# Patient Record
Sex: Male | Born: 1990 | Race: White | Hispanic: No | Marital: Single | Smoking: Current every day smoker
Health system: Southern US, Community
[De-identification: ages and names within clinical notes are randomized; demographics above are authoritative.]

## PROBLEM LIST (undated history)

## (undated) DIAGNOSIS — Z789 Other specified health status: Secondary | ICD-10-CM

## (undated) DIAGNOSIS — F319 Bipolar disorder, unspecified: Secondary | ICD-10-CM

## (undated) DIAGNOSIS — I10 Essential (primary) hypertension: Secondary | ICD-10-CM

## (undated) DIAGNOSIS — R4586 Emotional lability: Secondary | ICD-10-CM

## (undated) HISTORY — PX: BRAIN SURGERY: SHX531

---
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
## 2008-12-30 ENCOUNTER — Emergency Department: Payer: Self-pay | Admitting: Emergency Medicine

## 2010-08-26 ENCOUNTER — Emergency Department: Payer: Self-pay | Admitting: Emergency Medicine

## 2010-08-26 IMAGING — CT CT HEAD WITHOUT CONTRAST
2 series · 16 of 30 positions shown, 20 images · non-contrast
Comparison: none

REASON FOR EXAM: one year of headaches
COMMENTS:

PROCEDURE:     CT  - CT HEAD WITHOUT CONTRAST  - [DATE] [DATE]
RESULT:     History: Headaches.

[Series 2: without · axial · non-contrast · 0.44mm/px · z∈[+932,+1056]mm · 13 of 31 slices shown, 17 images]
[im 3/31  brain]
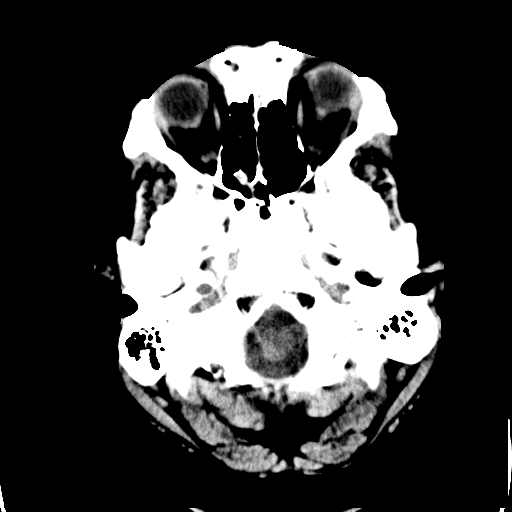
[im 3/31  bone]
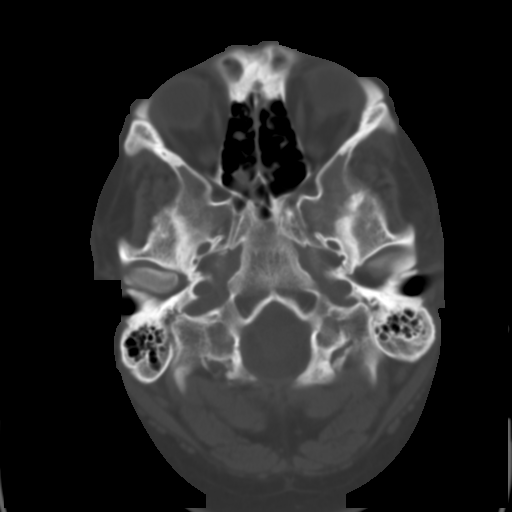
[im 5/31  brain]
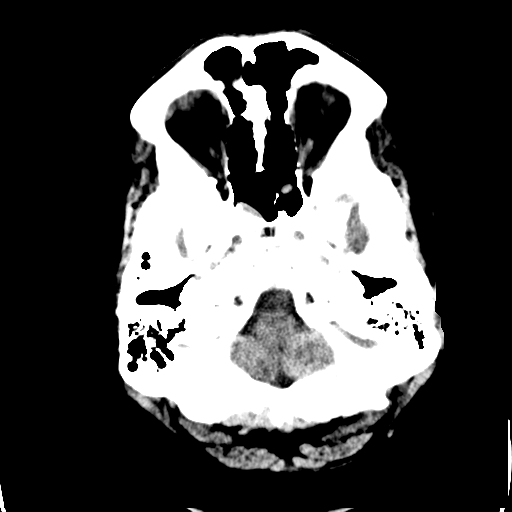
[im 7/31  brain]
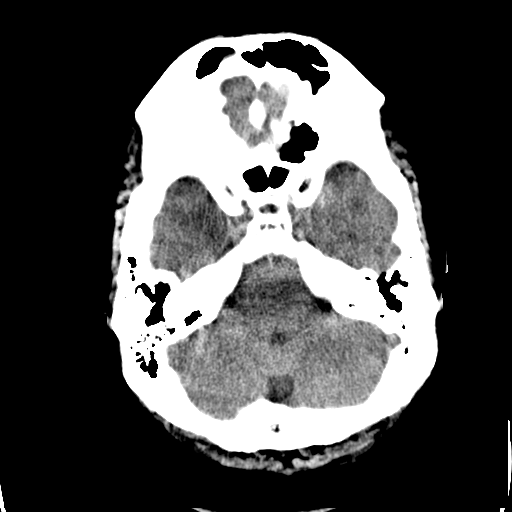
[im 9/31  brain]
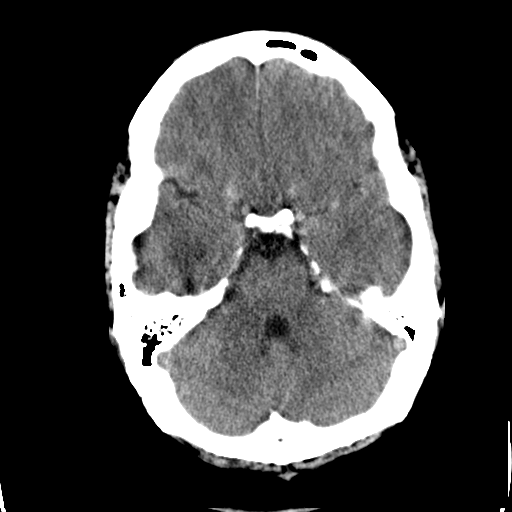
[im 11/31  brain]
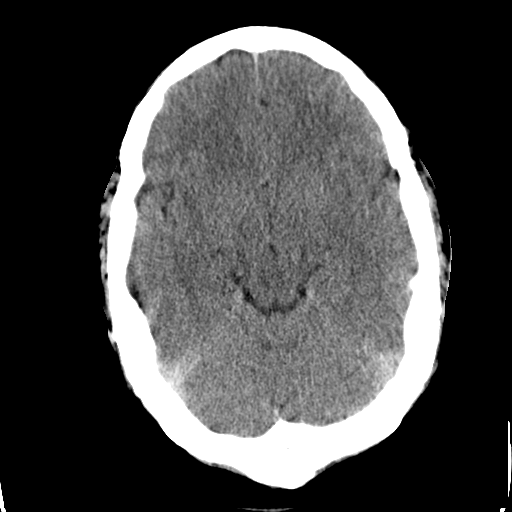
[im 11/31  bone]
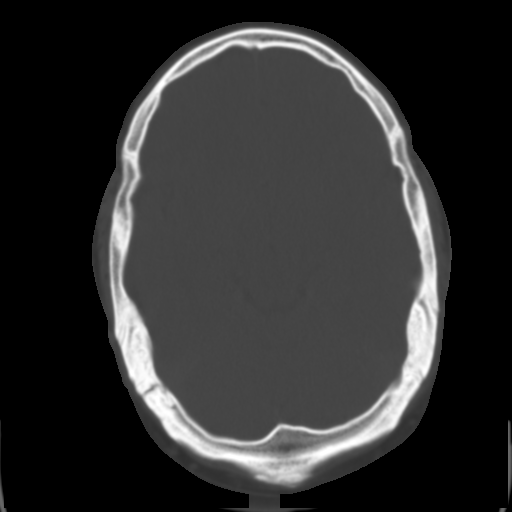
[im 13/31  brain]
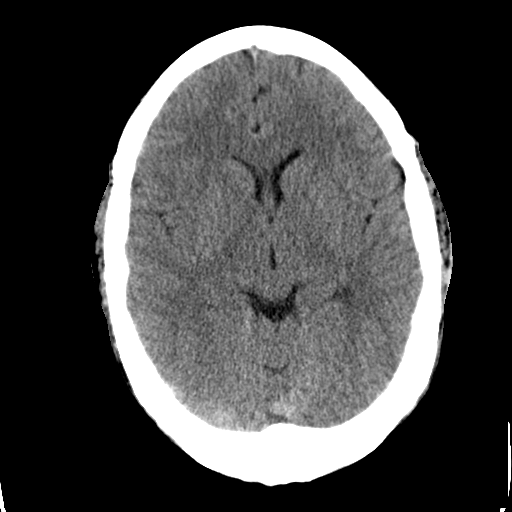
[im 16/31  brain]
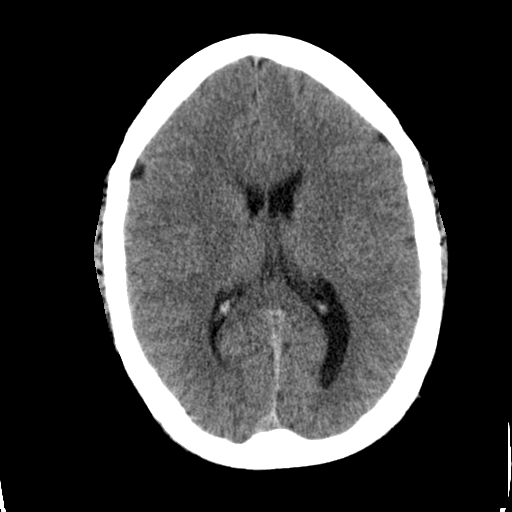
[im 18/31  brain]
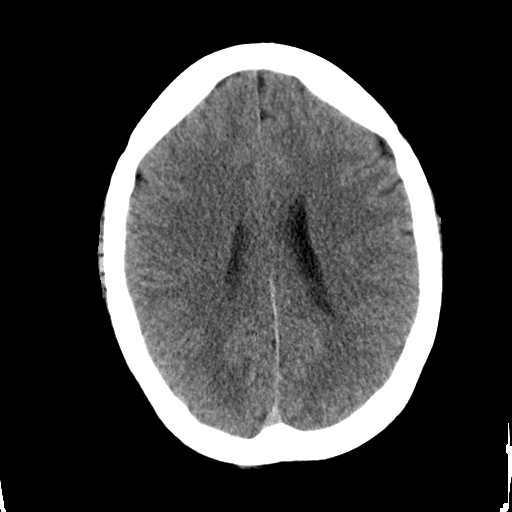
[im 20/31  brain]
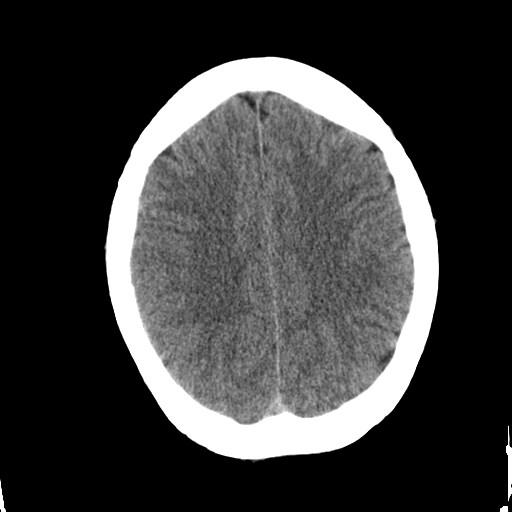
[im 20/31  bone]
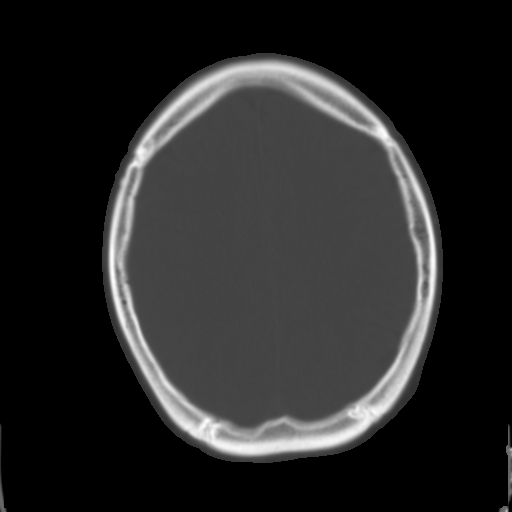
[im 22/31  brain]
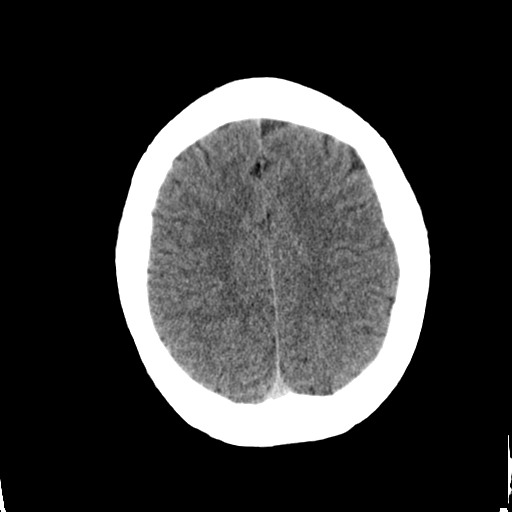
[im 24/31  brain]
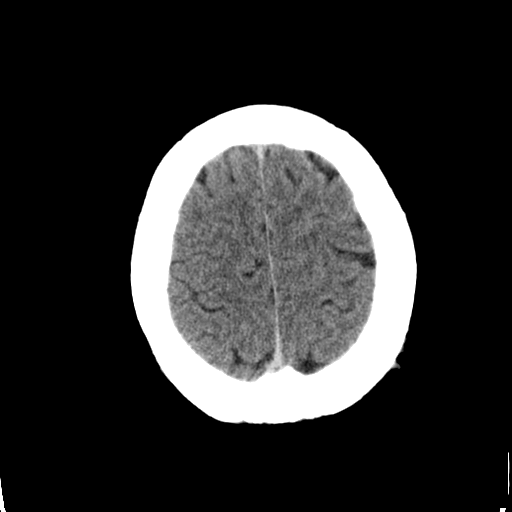
[im 26/31  brain]
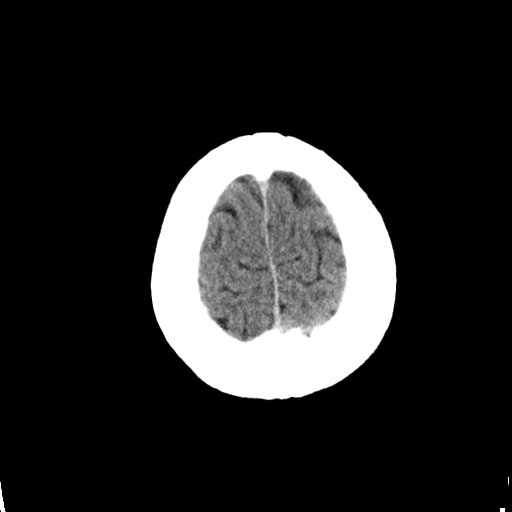
[im 28/31  brain]
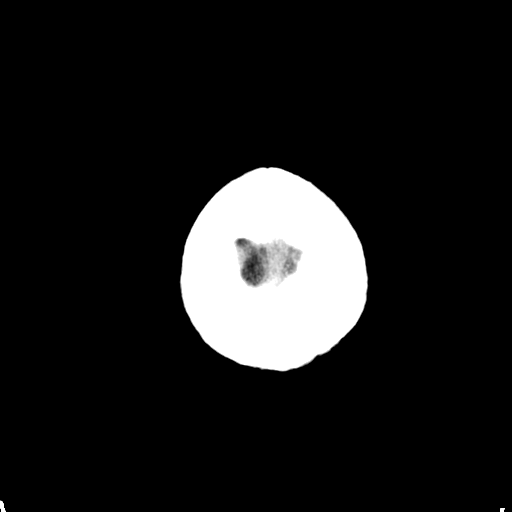
[im 28/31  bone]
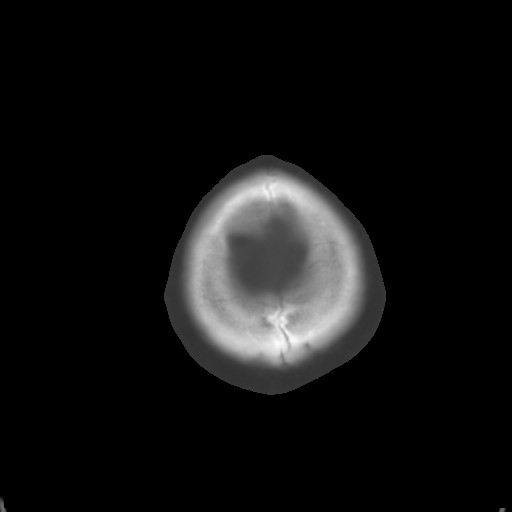

[Series 3: bone · axial · 0.44mm/px · z∈[+932,+972]mm · 3 of 31 slices shown]
[im 3/31  bone]
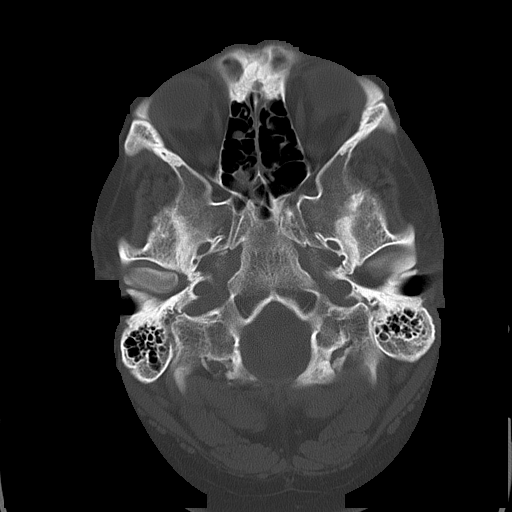
[im 7/31  bone]
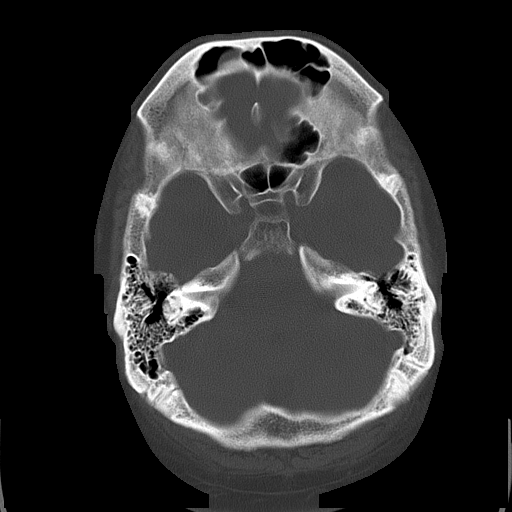
[im 11/31  bone]
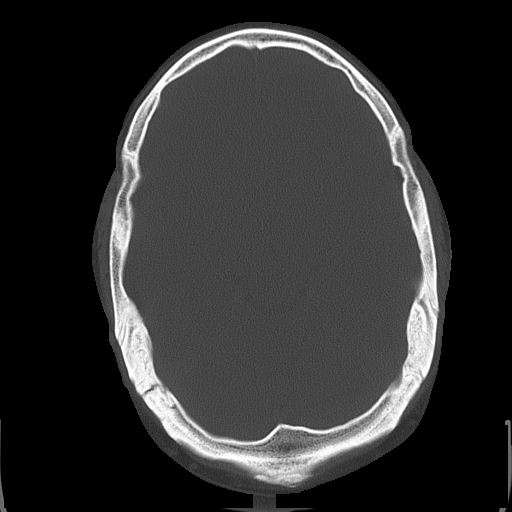

[16 of 30 positions shown; findings below may reference images not displayed]

FINDINGS: No mass lesion. No hydrocephalus. No bleed. No acute bony
abnormality.
IMPRESSION: No acute abnormality.

## 2011-05-03 ENCOUNTER — Inpatient Hospital Stay: Payer: Self-pay | Admitting: Psychiatry

## 2012-04-03 ENCOUNTER — Emergency Department: Payer: Self-pay | Admitting: Emergency Medicine

## 2012-04-03 LAB — CBC WITH DIFFERENTIAL/PLATELET
Basophil #: 0.1 10*3/uL (ref 0.0–0.1)
Eosinophil #: 0.5 10*3/uL (ref 0.0–0.7)
HCT: 54 % — ABNORMAL HIGH (ref 40.0–52.0)
Lymphocyte %: 31.3 %
MCH: 31.4 pg (ref 26.0–34.0)
MCHC: 34.3 g/dL (ref 32.0–36.0)
Monocyte #: 0.7 x10 3/mm (ref 0.2–1.0)
Monocyte %: 6.3 %
Neutrophil #: 6 10*3/uL (ref 1.4–6.5)
Platelet: 222 10*3/uL (ref 150–440)
RDW: 13.3 % (ref 11.5–14.5)
WBC: 10.5 10*3/uL (ref 3.8–10.6)

## 2012-04-03 LAB — PROTIME-INR
INR: 0.9
Prothrombin Time: 12.8 secs (ref 11.5–14.7)

## 2012-04-03 LAB — COMPREHENSIVE METABOLIC PANEL
Albumin: 4.7 g/dL (ref 3.4–5.0)
Alkaline Phosphatase: 75 U/L (ref 50–136)
Anion Gap: 10 (ref 7–16)
Calcium, Total: 9.8 mg/dL (ref 8.5–10.1)
Chloride: 106 mmol/L (ref 98–107)
Co2: 26 mmol/L (ref 21–32)
EGFR (Non-African Amer.): >60
Glucose: 92 mg/dL (ref 65–99)
Potassium: 3.7 mmol/L (ref 3.5–5.1)
SGOT(AST): 13 U/L — ABNORMAL LOW (ref 15–37)
SGPT (ALT): 31 U/L (ref 12–78)
Total Protein: 8 g/dL (ref 6.4–8.2)

## 2013-04-07 ENCOUNTER — Emergency Department: Payer: Self-pay | Admitting: Emergency Medicine

## 2020-07-27 ENCOUNTER — Encounter: Payer: Self-pay | Admitting: Emergency Medicine

## 2020-07-27 ENCOUNTER — Emergency Department
Admission: EM | Admit: 2020-07-27 | Discharge: 2020-07-27 | Disposition: A | Discharge status: Home / Self Care | Payer: Self-pay | Attending: Emergency Medicine | Admitting: Emergency Medicine

## 2020-07-27 ENCOUNTER — Emergency Department: Payer: Self-pay

## 2020-07-27 ENCOUNTER — Other Ambulatory Visit: Payer: Self-pay

## 2020-07-27 DIAGNOSIS — F22 Delusional disorders: Secondary | ICD-10-CM | POA: Insufficient documentation

## 2020-07-27 DIAGNOSIS — R109 Unspecified abdominal pain: Secondary | ICD-10-CM | POA: Insufficient documentation

## 2020-07-27 DIAGNOSIS — F17200 Nicotine dependence, unspecified, uncomplicated: Secondary | ICD-10-CM | POA: Insufficient documentation

## 2020-07-27 LAB — CBC WITH DIFFERENTIAL/PLATELET
Abs Immature Granulocytes: 0.03 10*3/uL (ref 0.00–0.07)
Basophils Absolute: 0 10*3/uL (ref 0.0–0.1)
Basophils Relative: 0 %
Eosinophils Absolute: 0.1 10*3/uL (ref 0.0–0.5)
Eosinophils Relative: 1 %
HCT: 49 % (ref 39.0–52.0)
Hemoglobin: 16.8 g/dL (ref 13.0–17.0)
Immature Granulocytes: 0 %
Lymphocytes Relative: 18 %
Lymphs Abs: 1.6 10*3/uL (ref 0.7–4.0)
MCH: 31.8 pg (ref 26.0–34.0)
MCHC: 34.3 g/dL (ref 30.0–36.0)
MCV: 92.8 fL (ref 80.0–100.0)
Monocytes Absolute: 0.6 10*3/uL (ref 0.1–1.0)
Monocytes Relative: 6 %
Neutro Abs: 6.4 10*3/uL (ref 1.7–7.7)
Neutrophils Relative %: 75 %
Platelets: 202 10*3/uL (ref 150–400)
RBC: 5.28 MIL/uL (ref 4.22–5.81)
RDW: 12 % (ref 11.5–15.5)
WBC: 8.7 10*3/uL (ref 4.0–10.5)
nRBC: 0 % (ref 0.0–0.2)

## 2020-07-27 LAB — COMPREHENSIVE METABOLIC PANEL
ALT: 16 U/L (ref 0–44)
AST: 25 U/L (ref 15–41)
Albumin: 4.4 g/dL (ref 3.5–5.0)
Alkaline Phosphatase: 46 U/L (ref 38–126)
Anion gap: 10 (ref 5–15)
BUN: 12 mg/dL (ref 6–20)
CO2: 27 mmol/L (ref 22–32)
Calcium: 9.3 mg/dL (ref 8.9–10.3)
Chloride: 101 mmol/L (ref 98–111)
Creatinine, Ser: 0.86 mg/dL (ref 0.61–1.24)
GFR, Estimated: >60 mL/min (ref >60)
Glucose, Bld: 109 mg/dL — ABNORMAL HIGH (ref 70–99)
Potassium: 4.7 mmol/L (ref 3.5–5.1)
Sodium: 138 mmol/L (ref 135–145)
Total Bilirubin: 1.3 mg/dL — ABNORMAL HIGH (ref 0.3–1.2)
Total Protein: 7 g/dL (ref 6.5–8.1)

## 2020-07-27 LAB — LIPASE, BLOOD: Lipase: 24 U/L (ref 11–51)

## 2020-07-27 LAB — TROPONIN I (HIGH SENSITIVITY): Troponin I (High Sensitivity): 3 ng/L (ref <18)

## 2020-07-27 MED ORDER — IOHEXOL 300 MG/ML  SOLN
100.0000 mL | Freq: Once | INTRAMUSCULAR | Status: AC | PRN
  Administered 2020-07-27: 100 mL via INTRAVENOUS

## 2020-07-27 MED ORDER — IOHEXOL 9 MG/ML PO SOLN: 500.0000 mL | Freq: Two times a day (BID) | ORAL | Status: DC | PRN

## 2020-07-27 MED ORDER — SODIUM CHLORIDE 0.9 % IV BOLUS
1000.0000 mL | Freq: Once | INTRAVENOUS | Status: AC
  Administered 2020-07-27: 1000 mL via INTRAVENOUS

## 2020-07-27 NOTE — ED Provider Notes (Signed)
[facility] Emergency Department Provider Note   ____________________________________________   Event Date/Time   First MD Initiated Contact with Patient 07/27/20 [phone]     (approximate)  I have reviewed the triage vital signs and the nursing notes.   HISTORY  Chief Complaint Abdominal Pain    HPI Cody Young. is a 30 y.o. male with stated past medical history of alcohol abuse who presents for the sensation of "spiders in my stomach".  Patient states that he feels like there is small amounts of movement all over his abdomen that "feels like crawling spiders".  Patient states that this has been present over the last 2-3 weeks and varying degrees of intensity.  Patient also states his last bowel movement was yesterday and was well formed and normal.  Patient denies any history of psychosis.  Patient currently denies any vision changes, tinnitus, difficulty speaking, facial droop, sore throat, chest pain, shortness of breath, nausea/vomiting/diarrhea, dysuria, or weakness/numbness/paresthesias in any extremity         History reviewed. No pertinent past medical history.  There are no problems to display for this patient.   History reviewed. No pertinent surgical history.  Prior to Admission medications   Not on File    Allergies Patient has no known allergies.  No family history on file.  Social History Social History   Tobacco Use  . Smoking status: Current Every Day Smoker    Packs/day: 1.00  . Smokeless tobacco: Never Used  Substance Use Topics  . Alcohol use: Yes  . Drug use: Yes    Types: Marijuana    Review of Systems Constitutional: No fever/chills Eyes: No visual changes. ENT: No sore throat. Cardiovascular: Denies chest pain. Respiratory: Denies shortness of breath. Gastrointestinal: Endorses abdominal pain.  No nausea, no vomiting.  No diarrhea. Genitourinary: Negative for dysuria. Musculoskeletal: Negative for  acute arthralgias Skin: Negative for rash. Neurological: Negative for headaches, weakness/numbness/paresthesias in any extremity Psychiatric: Negative for suicidal ideation/homicidal ideation   ____________________________________________   PHYSICAL EXAM:  VITAL SIGNS: ED Triage Vitals  Enc Vitals Group     BP 07/27/20 0915 (!) 158/116     Pulse Rate 07/27/20 0915 79     Resp 07/27/20 0915 16     Temp 07/27/20 0915 97.9 F (36.6 C)     Temp Source 07/27/20 0915 Oral     SpO2 07/27/20 0915 100 %     Weight 07/27/20 0912 240 lb (108.9 kg)     Height 07/27/20 0912 6\' 2"  (1.88 m)     Head Circumference --      Peak Flow --      Pain Score 07/27/20 0911 8     Pain Loc --      Pain Edu? --      Excl. in [place]? --    Constitutional: Alert and oriented. Well appearing and in no acute distress. Eyes: Conjunctivae are normal. PERRL. Head: Atraumatic. Nose: No congestion/rhinnorhea. Mouth/Throat: Mucous membranes are moist. Neck: No stridor Cardiovascular: Grossly normal heart sounds.  Good peripheral circulation. Respiratory: Normal respiratory effort.  No retractions. Gastrointestinal: Soft and nontender. No distention. Musculoskeletal: No obvious deformities Neurologic:  Normal speech and language. No gross focal neurologic deficits are appreciated. Skin:  Skin is warm and dry. No rash noted. Psychiatric: Mood and affect are normal. Speech and behavior are normal.  ____________________________________________   LABS (all labs ordered are listed, but only abnormal results are displayed)  Labs Reviewed  COMPREHENSIVE METABOLIC PANEL -  Abnormal; Notable for the following components:      Result Value   Glucose, Bld 109 (*)    Total Bilirubin 1.3 (*)    All other components within normal limits  LIPASE, BLOOD  CBC WITH DIFFERENTIAL/PLATELET  URINALYSIS, COMPLETE (UACMP) WITH MICROSCOPIC  URINE DRUG SCREEN, QUALITATIVE ([facility] ONLY)  TROPONIN I (HIGH SENSITIVITY)  TROPONIN I  (HIGH SENSITIVITY)   ____________________________________________  EKG  ED ECG REPORT I, Merwyn Katos, the attending physician, personally viewed and interpreted this ECG.  Date: 07/27/2020 EKG Time: 0916 Rate: 77 Rhythm: normal sinus rhythm QRS Axis: normal Intervals: normal ST/T Wave abnormalities: normal Narrative Interpretation: no evidence of acute ischemia  ____________________________________________  RADIOLOGY  ED MD interpretation: CT of the abdomen and pelvis with IV contrast shows no evidence of acute abnormalities including no acute obstruction, inflammatory changes, or significant amounts of free fluid  Official radiology report(s): CT Abdomen Pelvis W Contrast  Result Date: 07/27/2020 CLINICAL DATA:  Acute a renal pain, nonlocalized abdominal pain EXAM: CT ABDOMEN AND PELVIS WITH CONTRAST TECHNIQUE: Multidetector CT imaging of the abdomen and pelvis was performed using the standard protocol following bolus administration of intravenous contrast. CONTRAST:  OMNIPAQUE IOHEXOL 300 MG/ML  SOLN COMPARISON:  None FINDINGS: Lower chest: Incidental imaging of the lung bases is unremarkable without effusion or consolidation. Hepatobiliary: Hepatic steatosis. No focal, suspicious hepatic lesion. Portal vein is patent. No pericholecystic stranding. No biliary duct dilation. Pancreas: Normal Spleen: Normal. Adrenals/Urinary Tract: Adrenal glands are normal. Small cyst in the interpolar RIGHT kidney. No hydronephrosis. The urinary bladder with smooth contours. No perinephric stranding. Stomach/Bowel: Stomach is normal. No acute gastrointestinal process. Appendix is normal. Vascular/Lymphatic: Scattered atheromatous plaque in the abdominal aorta. No aneurysmal dilation. There is no gastrohepatic or hepatoduodenal ligament lymphadenopathy. No retroperitoneal or mesenteric lymphadenopathy. No pelvic sidewall lymphadenopathy. Reproductive: Prostate unremarkable by CT. Other: No  ascites.  No free air. Musculoskeletal: Spinal degenerative changes. No acute bone process. IMPRESSION: 1. No acute findings in the abdomen or pelvis. 2. Hepatic steatosis. 3. Aortic atherosclerosis. Aortic Atherosclerosis (ICD10-I70.0). Electronically Signed   By: Donzetta Kohut M.D.   On: 07/27/2020 10:52    ____________________________________________   PROCEDURES  Procedure(s) performed (including Critical Care):  .1-3 Lead EKG Interpretation Performed by: Merwyn Katos, MD Authorized by: Merwyn Katos, MD     Interpretation: normal     ECG rate:  69   ECG rate assessment: normal     Rhythm: sinus rhythm     Ectopy: none     Conduction: normal       ____________________________________________   INITIAL IMPRESSION / ASSESSMENT AND PLAN / ED COURSE  As part of my medical decision making, I reviewed the following data within the electronic MEDICAL RECORD NUMBER Nursing notes reviewed and incorporated, Labs reviewed, EKG interpreted, Old chart reviewed, Radiograph reviewed and Notes from prior ED visits reviewed and incorporated        Patient presents for abdominal pain.  Differential diagnosis includes appendicitis, abdominal aortic aneurysm, surgical biliary disease, pancreatitis, SBO, mesenteric ischemia, serious intra-abdominal bacterial illness, genital torsion. Doubt atypical ACS. Based on history, physical exam, radiologic/laboratory evaluation, there is no red flag results or symptomatology requiring emergent intervention or need for admission at this time Pt tolerating PO. Disposition: Patient will be discharged with strict return precautions and follow up with primary MD within 12-24 hours for further evaluation. Patient understands that this still may have an early presentation of an emergent medical condition such as  appendicitis that will require a recheck.      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   Merwyn Katos, MD 07/27/20 1126

## 2020-07-27 NOTE — ED Notes (Addendum)
On assessment, pt states that he has been having this problem for the past 2-3 weeks. Pt denies any NVD but pt states, "there is some stoppage." pt did report a BM yesterday. Pt says that he is having some rectal bleeding. When asked if he has ever had hemorrhoids, pt states, "I have had hemorrhoids before but this is different, this is actually crawling inside of me and it getting somewhere I dont want it go." Pt states his abdominal pain is sharp but unable to pinpoint where specifically it hurts.

## 2020-07-27 NOTE — ED Triage Notes (Addendum)
Pt via EMS from home. Pt c/o generalized abdominal pain for 2-3 weeks. Pt state he also feel like he cant have a BM but did have a BM yesterday. Per EMS, pt states that he thinks he has "spiders in his stomach." Pt A&Ox4 and NAD. Pt states, "can you tell the doctor that I think it is emergency." Pt admits to 2 beers and using marijuana last night

## 2020-07-27 NOTE — ED Notes (Signed)
Pt directed to cutesy phone.

## 2020-07-27 NOTE — ED Notes (Signed)
Pt calling for ride

## 2020-07-27 NOTE — ED Notes (Signed)
Pt does have a hx of HTN. Pt states that he used to take metoprolol a couple of years back but does not take that anymore.

## 2022-06-14 ENCOUNTER — Emergency Department: Payer: Self-pay

## 2022-06-14 ENCOUNTER — Emergency Department
Admission: EM | Admit: 2022-06-14 | Discharge: 2022-06-14 | Disposition: A | Discharge status: Home / Self Care | Payer: Self-pay | Attending: Emergency Medicine | Admitting: Emergency Medicine

## 2022-06-14 ENCOUNTER — Encounter: Payer: Self-pay | Admitting: Emergency Medicine

## 2022-06-14 DIAGNOSIS — M545 Low back pain, unspecified: Secondary | ICD-10-CM | POA: Insufficient documentation

## 2022-06-14 DIAGNOSIS — G8911 Acute pain due to trauma: Secondary | ICD-10-CM | POA: Insufficient documentation

## 2022-06-14 DIAGNOSIS — Z8659 Personal history of other mental and behavioral disorders: Secondary | ICD-10-CM | POA: Insufficient documentation

## 2022-06-14 DIAGNOSIS — R030 Elevated blood-pressure reading, without diagnosis of hypertension: Secondary | ICD-10-CM | POA: Insufficient documentation

## 2022-06-14 DIAGNOSIS — W19XXXA Unspecified fall, initial encounter: Secondary | ICD-10-CM | POA: Insufficient documentation

## 2022-06-14 DIAGNOSIS — S8001XA Contusion of right knee, initial encounter: Secondary | ICD-10-CM | POA: Insufficient documentation

## 2022-06-14 MED ORDER — IBUPROFEN 600 MG PO TABS
600.0000 mg | ORAL_TABLET | Freq: Once | ORAL | Status: AC
  Administered 2022-06-14: 600 mg via ORAL
  Filled 2022-06-14: qty 1

## 2022-06-14 MED ORDER — IBUPROFEN 600 MG PO TABS: 600.0000 mg | ORAL_TABLET | Freq: Three times a day (TID) | ORAL | 0 refills | Status: DC | PRN

## 2022-06-14 NOTE — ED Provider Notes (Addendum)
[facility] Provider Note    None    (approximate)   History   Back Pain   HPI  Cody Sermersheim. is a 31 y.o. male presents to the ED with complaint of low back pain and right knee pain after falling 3 days due to a wet surface.  Patient denies any head injury or loss of consciousness.  Patient states that 2 days prior to this he drank alcohol and therefore has not taken any over-the-counter medication.  Patient also reports that he has bipolar and was on medication when he was living up [place].  He has been out of his medication for over a year and has not seen anyone since moving to this area.  He denies any suicidal or homicidal ideation.     Physical Exam   Triage Vital Signs: ED Triage Vitals  Enc Vitals Group     BP 06/14/22 0326 (!) 139/100     Pulse Rate 06/14/22 0326 99     Resp 06/14/22 0326 17     Temp 06/14/22 0326 97.7 F (36.5 C)     Temp Source 06/14/22 0326 Oral     SpO2 06/14/22 0326 99 %     Weight 06/14/22 0333 200 lb (90.7 kg)     Height 06/14/22 0333 6\' 2"  (1.88 m)     Head Circumference --      Peak Flow --      Pain Score 06/14/22 0333 6     Pain Loc --      Pain Edu? --      Excl. in [place]? --     Most recent vital signs: Vitals:   06/14/22 0326 06/14/22 0755  BP: (!) 139/100 (!) 142/102  Pulse: 99 82  Resp: 17 16  Temp: 97.7 F (36.5 C) 97.7 F (36.5 C)  SpO2: 99% 99%     General: Awake, no distress.  Alert, cooperative.  Able to answer questions appropriately. CV:  Good peripheral perfusion.  Resp:  Normal effort.  Lungs are clear bilaterally. Abd:  No distention.  Other:  On examination of the right knee there is no gross deformity and no effusion present.  No skin abrasion or discoloration present.  Patient is able to flex and extend his right lower extremity without any difficulty.  On examination of the back there is no gross deformity however patient is tender on palpation lower lumbar and sacral area.   No soft tissue injury or discoloration is present.  Good muscle strength bilaterally at 5/5.   ED Results / Procedures / Treatments   Labs (all labs ordered are listed, but only abnormal results are displayed) Labs Reviewed - No data to display   RADIOLOGY Right knee x-ray images were reviewed and interpreted by myself independent of the radiologist is negative. Lumbar spine x-ray images were reviewed and interpreted by myself as negative for fracture.  Official radiology report is negative acute findings but narrowing noted at T11-T12 and endplate spurring.    PROCEDURES:  Critical Care performed:   Procedures   MEDICATIONS ORDERED IN ED: Medications  ibuprofen (ADVIL) tablet 600 mg (600 mg Oral Given 06/14/22 [id number])     IMPRESSION / MDM / ASSESSMENT AND PLAN / ED COURSE  I reviewed the triage vital signs and the nursing notes.   Differential diagnosis includes, but is not limited to, contusion lower back, compression fracture, lumbar strain, contusion right knee, fracture, muscle skeletal pain secondary to fall.  History  of bipolar disorder.  31 year old male presents to the ED with complaint of low back pain and right knee pain after a fall that occurred approximately 3 days ago.  X-rays of the right knee and lumbar spine were reassuring and patient was made aware that there was no fracture.  He was given ibuprofen 600 mg p.o. while in the ED.  We also discussed his lack of medication for his bipolar disorder.  No record in this facility indicates that he was on medication and his doctor is up [place].  Patient states he has been out of his medication for over a year.  We discussed follow-up with [facility] and a separate paper attached to his discharge papers was given to him so that he could talk to someone on the next day that they are open.  Patient denies any suicidal or homicidal ideation and is aware that no medication is being given today.  He was also given a list of primary care  providers in the [place] area so that he can obtain a PCP since he is living in this area not up [place].      Patient's presentation is most consistent with acute complicated illness / injury requiring diagnostic workup.  FINAL CLINICAL IMPRESSION(S) / ED DIAGNOSES   Final diagnoses:  Contusion of right knee, initial encounter  Acute midline low back pain without sciatica  Fall, initial encounter  History of bipolar disorder  Elevated blood pressure reading     Rx / DC Orders   ED Discharge Orders          Ordered    ibuprofen (ADVIL) 600 MG tablet  Every 8 hours PRN        06/14/22 0852             Note:  This document was prepared using Dragon voice recognition software and may include unintentional dictation errors.   Tommi Rumps, PA-C 06/14/22 1345    Tommi Rumps, PA-C 06/14/22 1347    Arnaldo Natal, MD 06/14/22 1415

## 2022-06-14 NOTE — Discharge Instructions (Addendum)
Call [facility] to see about their walk-in hours on Tuesday after Christmas for evaluation of your bipolar disorder and medications.  Also you will need to obtain a primary care provider in this area and a list of options are listed on your discharge papers.  You should have your blood pressure rechecked as it was elevated in the emergency department which may be due to pain.  If it continues to be elevated you will need to be treated for hypertension.  A prescription for ibuprofen was printed in the event that you would like to have a prescription for your contusion of your knee.  Do not drink alcohol with this medication.  You may continue with ice packs to your knee and to your back as needed for discomfort.  Please go to the following website to schedule new (and existing) patient appointments:   [email or web address]   The following is a list of primary care offices in the area who are accepting new patients at this time.  Please reach out to one of them directly and let them know you would like to schedule an appointment to follow up on an Emergency Department visit, and/or to establish a new primary care provider (PCP).  There are likely other primary care clinics in the are who are accepting new patients, but this is an excellent place to start:  [facility] Lead physician: Dr Shirlee Latch [address] [place], [place] [address] [phone]  [facility] Lead Physician: Dr Alba Cory [address], [place], [place] [address] [phone]  [facility]  Lead Physician: Dr Olevia Perches [address] [place], [place] [address] [phone]  [facility] Lead Physician: Dr Sofie Hartigan [address] [place], [place] [address] [phone]  [facility] Lead Physician: Dr Bari Edward [address] [place], [place], [place] [address] [phone]

## 2022-06-14 NOTE — ED Notes (Signed)
Pt discharge to home. Pt VSS, GCS 15, NAD. Pt verbalized understanding of discharge instructions with no additional questions at this time.  

## 2022-06-14 NOTE — ED Triage Notes (Signed)
Pt presents via POV with complaints of low back pain that radiates down to his knee. Endorses a fall - no LOC - didn't hit his head but landed on his right knee. Of note, pt is requesting more of bipolar medication. Notes being off for one year but is considering getting back on it. Denies SI/HI.

## 2022-06-14 NOTE — ED Notes (Signed)
Pt states he is here for back and knee pain. Pt states he is also bipolar and states "I just wanted to see if I can stay a couple days to get my medications back on track, I dont want to go back home because I am alone." Pt states he was on depakote and ambilify but hasn't taken anything for a year. Pt denies any thoughts of SI at this time but states his mood changes very quickly.

## 2022-06-15 ENCOUNTER — Emergency Department
Admission: EM | Admit: 2022-06-15 | Discharge: 2022-06-16 | Disposition: A | Discharge status: Other Acute Inpatient Hospital | Payer: Self-pay | Attending: Emergency Medicine | Admitting: Emergency Medicine

## 2022-06-15 DIAGNOSIS — F1590 Other stimulant use, unspecified, uncomplicated: Secondary | ICD-10-CM

## 2022-06-15 DIAGNOSIS — F1290 Cannabis use, unspecified, uncomplicated: Secondary | ICD-10-CM | POA: Insufficient documentation

## 2022-06-15 DIAGNOSIS — Z20822 Contact with and (suspected) exposure to covid-19: Secondary | ICD-10-CM | POA: Insufficient documentation

## 2022-06-15 DIAGNOSIS — R45851 Suicidal ideations: Secondary | ICD-10-CM | POA: Insufficient documentation

## 2022-06-15 DIAGNOSIS — F314 Bipolar disorder, current episode depressed, severe, without psychotic features: Secondary | ICD-10-CM

## 2022-06-15 DIAGNOSIS — F32A Depression, unspecified: Secondary | ICD-10-CM

## 2022-06-15 DIAGNOSIS — F329 Major depressive disorder, single episode, unspecified: Secondary | ICD-10-CM | POA: Insufficient documentation

## 2022-06-15 DIAGNOSIS — F1410 Cocaine abuse, uncomplicated: Secondary | ICD-10-CM | POA: Insufficient documentation

## 2022-06-15 DIAGNOSIS — F29 Unspecified psychosis not due to a substance or known physiological condition: Secondary | ICD-10-CM | POA: Insufficient documentation

## 2022-06-15 LAB — CBC
HCT: 51.5 % (ref 39.0–52.0)
Hemoglobin: 16.8 g/dL (ref 13.0–17.0)
MCH: 31.2 pg (ref 26.0–34.0)
MCHC: 32.6 g/dL (ref 30.0–36.0)
MCV: 95.7 fL (ref 80.0–100.0)
Platelets: 189 10*3/uL (ref 150–400)
RBC: 5.38 MIL/uL (ref 4.22–5.81)
RDW: 13.1 % (ref 11.5–15.5)
WBC: 9.9 10*3/uL (ref 4.0–10.5)
nRBC: 0 % (ref 0.0–0.2)

## 2022-06-15 LAB — COMPREHENSIVE METABOLIC PANEL
ALT: 30 U/L (ref 0–44)
AST: 49 U/L — ABNORMAL HIGH (ref 15–41)
Albumin: 4.5 g/dL (ref 3.5–5.0)
Alkaline Phosphatase: 64 U/L (ref 38–126)
Anion gap: 11 (ref 5–15)
BUN: 19 mg/dL (ref 6–20)
CO2: 26 mmol/L (ref 22–32)
Calcium: 9.1 mg/dL (ref 8.9–10.3)
Chloride: 101 mmol/L (ref 98–111)
Creatinine, Ser: 0.91 mg/dL (ref 0.61–1.24)
GFR, Estimated: >60 mL/min (ref >60)
Glucose, Bld: 86 mg/dL (ref 70–99)
Potassium: 3.8 mmol/L (ref 3.5–5.1)
Sodium: 138 mmol/L (ref 135–145)
Total Bilirubin: 2.8 mg/dL — ABNORMAL HIGH (ref 0.3–1.2)
Total Protein: 7.5 g/dL (ref 6.5–8.1)

## 2022-06-15 LAB — URINE DRUG SCREEN, QUALITATIVE (ARMC ONLY)
Amphetamines, Ur Screen: NOT DETECTED
Barbiturates, Ur Screen: NOT DETECTED
Benzodiazepine, Ur Scrn: NOT DETECTED
Cannabinoid 50 Ng, Ur ~~LOC~~: POSITIVE — AB
Cocaine Metabolite,Ur ~~LOC~~: POSITIVE — AB
MDMA (Ecstasy)Ur Screen: NOT DETECTED
Methadone Scn, Ur: NOT DETECTED
Opiate, Ur Screen: NOT DETECTED
Phencyclidine (PCP) Ur S: NOT DETECTED
Tricyclic, Ur Screen: NOT DETECTED

## 2022-06-15 LAB — SALICYLATE LEVEL: Salicylate Lvl: <7 mg/dL — ABNORMAL LOW (ref 7.0–30.0)

## 2022-06-15 LAB — ETHANOL: Alcohol, Ethyl (B): <10 mg/dL (ref <10)

## 2022-06-15 LAB — ACETAMINOPHEN LEVEL: Acetaminophen (Tylenol), Serum: <10 ug/mL — ABNORMAL LOW (ref 10–30)

## 2022-06-15 NOTE — Consult Note (Signed)
[facility] Face-to-Face Psychiatry Consult   Reason for Consult: Depression/suicidal ideation/drug use Referring Physician: EDP Patient Identification: Cody Young. MRN:  [id number] Principal Diagnosis: Verbalizes suicidal thoughts Diagnosis:  Principal Problem:   Verbalizes suicidal thoughts Active Problems:   Depression   Cocaine abuse (HCC)   Total Time spent with patient: 30 minutes  Subjective: "I had a bipolar episode." Cody Young. is a 31 y.o. male patient admitted with depression with expression of suicidal thoughts due to "my mental illness".  HPI: Patient presented to the ED early this morning, reporting suicidal ideation. Patient had been in the ED the previous day (yesterday, (12/24) complaining of back pain and he was discharged.  At that time he reported getting kicked out of his boarding home situation and is currently homeless. On evaluation today, patient states that he has a long history of bipolar illness.  He states that he was in this hospital several years ago, before he moved to [place].  (There is nothing in the chart; writer unaware if it is possible this was prior to merging of charts).  He states he has been here about 7 months and has been off his medications, although he cannot remember what he was on.  Patient admits to illicit drug use.  UDS is positive for cocaine and cannabinoids.  Patient states that he has thoughts of overdosing on illegal drugs.  He reports that he came to the hospital instead because he hopes that he can get some help.  He states that he has manic episodes, where his thoughts are racing and he cannot think.  He denies auditory or visual hallucinations.  Does not appear to be responding to internal stimuli.  Denies homicidal ideations.  Patient reports that he has been feeling more and more depressed, isolated, and feelings of hopelessness.  He reports that he "does not sleep."  Reports poor appetite.  Patient reports that he wants  help with his mental health and substance use.  Plan is to observe patient overnight in the ED and reassess in the morning.  Past Psychiatric History: Self-reported bipolar disorder  Risk to Self:   Risk to Others:   Prior Inpatient Therapy:   Prior Outpatient Therapy:    Past Medical History: No past medical history on file. No past surgical history on file. Family History: No family history on file. Family Psychiatric  History: Mother, bipolar disorder Social History:  Social History   Substance and Sexual Activity  Alcohol Use Yes     Social History   Substance and Sexual Activity  Drug Use Yes   Types: Marijuana    Social History   Socioeconomic History   Marital status: Single    Spouse name: Not on file   Number of children: Not on file   Years of education: Not on file   Highest education level: Not on file  Occupational History   Not on file  Tobacco Use   Smoking status: Every Day    Packs/day: 1.00    Types: Cigarettes   Smokeless tobacco: Never  Substance and Sexual Activity   Alcohol use: Yes   Drug use: Yes    Types: Marijuana   Sexual activity: Not on file  Other Topics Concern   Not on file  Social History Narrative   Not on file   Social Determinants of Health   Financial Resource Strain: Not on file  Food Insecurity: Not on file  Transportation Needs: Not on file  Physical  Activity: Not on file  Stress: Not on file  Social Connections: Not on file   Additional Social History:    Allergies:  No Known Allergies  Labs:  Results for orders placed or performed during the hospital encounter of 06/15/22 (from the past 48 hour(s))  Comprehensive metabolic panel     Status: Abnormal   Collection Time: 06/15/22  1:51 AM  Result Value Ref Range   Sodium 138 135 - 145 mmol/L   Potassium 3.8 3.5 - 5.1 mmol/L   Chloride 101 98 - 111 mmol/L   CO2 26 22 - 32 mmol/L   Glucose, Bld 86 70 - 99 mg/dL    Comment: Glucose reference range applies  only to samples taken after fasting for at least 8 hours.   BUN 19 6 - 20 mg/dL   Creatinine, Ser 9.600.91 0.61 - 1.24 mg/dL   Calcium 9.1 8.9 - 45.410.3 mg/dL   Total Protein 7.5 6.5 - 8.1 g/dL   Albumin 4.5 3.5 - 5.0 g/dL   AST 49 (H) 15 - 41 U/L   ALT 30 0 - 44 U/L   Alkaline Phosphatase 64 38 - 126 U/L   Total Bilirubin 2.8 (H) 0.3 - 1.2 mg/dL   GFR, Estimated >09>60 >81>60 mL/min    Comment: (NOTE) Calculated using the CKD-EPI Creatinine Equation (2021)    Anion gap 11 5 - 15    Comment: Performed at [facility], [address] [place], [place] [address]  Ethanol     Status: None   Collection Time: 06/15/22  1:51 AM  Result Value Ref Range   Alcohol, Ethyl (B) <10 <10 mg/dL    Comment: (NOTE) Lowest detectable limit for serum alcohol is 10 mg/dL.  For medical purposes only. Performed at [facility], [address] [place], [place] [address]   Salicylate level     Status: Abnormal   Collection Time: 06/15/22  1:51 AM  Result Value Ref Range   Salicylate Lvl <7.0 (L) 7.0 - 30.0 mg/dL    Comment: Performed at [facility], [address] [place], [place] [address]  Acetaminophen level     Status: Abnormal   Collection Time: 06/15/22  1:51 AM  Result Value Ref Range   Acetaminophen (Tylenol), Serum <10 (L) 10 - 30 ug/mL    Comment: (NOTE) Therapeutic concentrations vary significantly. A range of 10-30 ug/mL  may be an effective concentration for many patients. However, some  are best treated at concentrations outside of this range. Acetaminophen concentrations >150 ug/mL at 4 hours after ingestion  and >50 ug/mL at 12 hours after ingestion are often associated with  toxic reactions.  Performed at [facility], [address] [place], [place] [address]   cbc     Status: None   Collection Time: 06/15/22  1:51 AM  Result Value Ref Range   WBC 9.9 4.0 - 10.5 K/uL   RBC 5.38 4.22 - 5.81 MIL/uL   Hemoglobin 16.8 13.0 - 17.0 g/dL   HCT  69.651.5 29.539.0 - 28.452.0 %   MCV 95.7 80.0 - 100.0 fL   MCH 31.2 26.0 - 34.0 pg   MCHC 32.6 30.0 - 36.0 g/dL   RDW 13.213.1 44.011.5 - 10.215.5 %   Platelets 189 150 - 400 K/uL   nRBC 0.0 0.0 - 0.2 %    Comment: Performed at [facility], [address] [place], [place] [address]  Urine Drug Screen, Qualitative ([facility] only)     Status:  Abnormal   Collection Time: 06/15/22  1:51 AM  Result Value Ref Range   Tricyclic, Ur Screen NONE DETECTED NONE DETECTED   Amphetamines, Ur Screen NONE DETECTED NONE DETECTED   MDMA (Ecstasy)Ur Screen NONE DETECTED NONE DETECTED   Cocaine Metabolite,Ur [place] POSITIVE (A) NONE DETECTED   Opiate, Ur Screen NONE DETECTED NONE DETECTED   Phencyclidine (PCP) Ur S NONE DETECTED NONE DETECTED   Cannabinoid 50 Ng, Ur  POSITIVE (A) NONE DETECTED   Barbiturates, Ur Screen NONE DETECTED NONE DETECTED   Benzodiazepine, Ur Scrn NONE DETECTED NONE DETECTED   Methadone Scn, Ur NONE DETECTED NONE DETECTED    Comment: (NOTE) Tricyclics + metabolites, urine    Cutoff 1000 ng/mL Amphetamines + metabolites, urine  Cutoff 1000 ng/mL MDMA (Ecstasy), urine              Cutoff 500 ng/mL Cocaine Metabolite, urine          Cutoff 300 ng/mL Opiate + metabolites, urine        Cutoff 300 ng/mL Phencyclidine (PCP), urine         Cutoff 25 ng/mL Cannabinoid, urine                 Cutoff 50 ng/mL Barbiturates + metabolites, urine  Cutoff 200 ng/mL Benzodiazepine, urine              Cutoff 200 ng/mL Methadone, urine                   Cutoff 300 ng/mL  The urine drug screen provides only a preliminary, unconfirmed analytical test result and should not be used for non-medical purposes. Clinical consideration and professional judgment should be applied to any positive drug screen result due to possible interfering substances. A more specific alternate chemical method must be used in order to obtain a confirmed analytical result. Gas chromatography / mass spectrometry (GC/MS) is the  preferred confirm atory method. Performed at [facility], [address] [place], [place] [address]     No current facility-administered medications for this encounter.   Current Outpatient Medications  Medication Sig Dispense Refill   ibuprofen (ADVIL) 600 MG tablet Take 1 tablet (600 mg total) by mouth every 8 (eight) hours as needed for moderate pain. With food 30 tablet 0    Musculoskeletal: Strength & Muscle Tone: within normal limits Gait & Station: normal Patient leans: N/A  Psychiatric Specialty Exam:  Presentation  General Appearance: Casual  Eye Contact:Good  Speech:Clear and Coherent  Speech Volume:Normal  Handedness:Right   Mood and Affect  Mood:Depressed  Affect:Congruent; Blunt   Thought Process  Thought Processes:Coherent  Descriptions of Associations:Intact  Orientation:Full (Time, Place and Person)  Thought Content:WDL  History of Schizophrenia/Schizoaffective disorder:No  Duration of Psychotic Symptoms:No data recorded Hallucinations:Hallucinations: None  Ideas of Reference:None  Suicidal Thoughts:Suicidal Thoughts: Yes, Passive SI Passive Intent and/or Plan: With Plan  Homicidal Thoughts:Homicidal Thoughts: No   Sensorium  Memory:Immediate Fair  Judgment:Fair  Insight:Fair   Executive Functions  Concentration:Fair  Attention Span:Good  Recall:Fair  Fund of Knowledge:Fair  Language:Fair   Psychomotor Activity  Psychomotor Activity:Psychomotor Activity: Normal   Assets  Assets:Housing; Physical Health; Resilience; Social Support   Sleep  Sleep:Sleep: Poor   Physical Exam: Physical Exam Vitals and nursing note reviewed.  HENT:     Head: Normocephalic.     Nose: No congestion or rhinorrhea.  Eyes:     General:        Right eye: No discharge.  Left eye: No discharge.  Cardiovascular:     Rate and Rhythm: Normal rate.  Pulmonary:     Effort: Pulmonary effort is normal.   Musculoskeletal:        General: Normal range of motion.     Cervical back: Normal range of motion.  Skin:    General: Skin is dry.  Neurological:     Mental Status: He is alert and oriented to person, place, and time.  Psychiatric:        Attention and Perception: Attention normal.        Mood and Affect: Affect is inappropriate.        Behavior: Behavior is cooperative.        Thought Content: Thought content is not paranoid. Thought content includes suicidal (passive) ideation. Thought content does not include homicidal ideation.        Cognition and Memory: Cognition is impaired.        Judgment: Judgment is impulsive.    Review of Systems  Constitutional: Negative.   HENT: Negative.    Respiratory: Negative.    Skin: Negative.   Psychiatric/Behavioral:  Positive for depression, substance abuse and suicidal ideas. Negative for hallucinations and memory loss. The patient is not nervous/anxious and does not have insomnia.   All other systems reviewed and are negative.  Blood pressure 132/81, pulse 84, temperature 97.9 F (36.6 C), resp. rate 20, height 6\' 2"  (1.88 m), weight 93 kg, SpO2 97 %. Body mass index is 26.32 kg/m.  Treatment Plan Summary: Daily contact with patient to assess and evaluate symptoms and progress in treatment, Medication management, and Plan patient will remain in the ED overnight and be reassessed in the morning  Disposition: Supportive therapy provided about ongoing stressors. Patient to remain in the ED overnight for observation.  Will reassess in the morning.  , NP 06/15/2022 3:39 PM

## 2022-06-15 NOTE — BH Assessment (Signed)
Comprehensive Clinical Assessment (CCA) Note  06/15/2022 Cody Young [id number]  Chief Complaint:  Chief Complaint  Patient presents with   Suicidal   Cody Young reports, "Yesterday I had a bipolar episode and I was discharged, so later on I felt like committing suicide.  I thought about overdosing on narcotics.  He reports that he is currently staying in a boarding room.  I was going to be alone for Christmas and I was having a lot of racing thoughts. When I came here I wanted to get clean and get myself mentally clear.  He reports that he has gotten treatment in [facility] and in [place]. He states his first suicidal episode was in 2006.  He reports a history of substance use.  He reports that he is currently on medication, but he has been prescribed any medication in the last 14 months.  He repots that he has a poor support system.  He reports that he has used cocaine, Percocet, marijuana, alcohol in the past 4 days. He states that he drinks  He reports that he has problems falling asleep and staying asleep.  He states that he is currently in transition, as he has been unable to pay rent to his increase of substance use. He repots reports feelings of hopelessness, he reports a decrease in his appetite.  He denied symptoms of anxiety.  He denied having auditory or visual hallucinations.  Reports that he is feeling low due to isolation during the holidays.    TTS contacted Mr. Perotti's mother  - Lexton Hidalgo - [phone] for collateral information. She reports, "He is mentally ill. He has a history of schizophrenia, He thinks all kinds of things.  He has had serious illness.  He used to get an injection. He has been on all kinds of medication, he just would not stay on them.  He needs help.  He is desperate, He just does not know what to do.  He looks like he is mad when he is in the same room with you when he is not.  She reports that she spoke with him yesterday.  He does not realize  what he is doing, and he puts himself into dangerous situation. She reports that he smoked a lot of "pot".  UDS resulted positive for Cocaine and Cannabinoids.  Visit Diagnosis: Major Depression, Suicidal Ideation    CCA Screening, Triage and Referral (STR)  Patient Reported Information How did you hear about [place]? Self  Referral name: No data recorded Referral phone number: No data recorded  Whom do you see for routine medical problems? No data recorded Practice/Facility Name: No data recorded Practice/Facility Phone Number: No data recorded Name of Contact: No data recorded Contact Number: No data recorded Contact Fax Number: No data recorded Prescriber Name: No data recorded Prescriber Address (if known): No data recorded  What Is the Reason for Your Visit/Call Today? Bipolar Episode, Suicidal Thoughts  How Long Has This Been Causing You Problems? 1 wk - 1 month  What Do You Feel Would Help You the Most Today? Alcohol or Drug Use Treatment; Treatment for Depression or other mood problem   Have You Recently Been in Any Inpatient Treatment (Hospital/Detox/Crisis Center/28-Day Program)? No data recorded Name/Location of Program/Hospital:No data recorded How Long Were You There? No data recorded When Were You Discharged? No data recorded  Have You Ever Received Services From [facility] Before? No data recorded Who Do You See at [facility]? No data recorded  Have  You Recently Had Any Thoughts About Hurting Yourself? Yes  Are You Planning to Commit Suicide/Harm Yourself At This time? No   Have you Recently Had Thoughts About Hurting Someone Karolee Ohslse? No data recorded Explanation: No data recorded  Have You Used Any Alcohol or Drugs in the Past 24 Hours? No  How Long Ago Did You Use Drugs or Alcohol? No data recorded What Did You Use and How Much? No data recorded  Do You Currently Have a Therapist/Psychiatrist? No  Name of Therapist/Psychiatrist: No data  recorded  Have You Been Recently Discharged From Any Office Practice or Programs? No  Explanation of Discharge From Practice/Program: No data recorded    CCA Screening Triage Referral Assessment Type of Contact: Face-to-Face  Is this Initial or Reassessment? No data recorded Date Telepsych consult ordered in [facility]:  No data recorded Time Telepsych consult ordered in [facility]:  No data recorded  Patient Reported Information Reviewed? No data recorded Patient Left Without Being Seen? No data recorded Reason for Not Completing Assessment: No data recorded  Collateral Involvement: Buckner Maltalicia Pevey - [phone]   Does Patient Have a Court Appointed Legal Guardian? No data recorded Name and Contact of Legal Guardian: No data recorded If Minor and Not Living with Parent(s), Who has Custody? No data recorded Is CPS involved or ever been involved? Never  Is APS involved or ever been involved? Never   Patient Determined To Be At Risk for Harm To Self or Others Based on Review of Patient Reported Information or Presenting Complaint? Yes, for Self-Harm  Method: Plan without intent  Availability of Means: -- (States, "I can get it on the streets")  Intent: Vague intent or NA  Notification Required: No data recorded Additional Information for Danger to Others Potential: No data recorded Additional Comments for Danger to Others Potential: No data recorded Are There Guns or Other Weapons in Your Home? No  Types of Guns/Weapons: No data recorded Are These Weapons Safely Secured?                            No data recorded Who Could Verify You Are Able To Have These Secured: No data recorded Do You Have any Outstanding Charges, Pending Court Dates, Parole/Probation? Denied  Contacted To Inform of Risk of Harm To Self or Others: No data recorded  [facility] ED   Does Patient Present under Involuntary Commitment? No  IVC Papers Initial File Date: No data recorded  [place]   Patient Currently Receiving the Following Services: Not Receiving Services   Determination of Need: No data recorded  Options For Referral: Inpatient Hospitalization     CCA Biopsychosocial Intake/Chief Complaint:  No data recorded Current Symptoms/Problems: No data recorded  Patient Reported Schizophrenia/Schizoaffective Diagnosis in Past: No   Strengths: No data recorded Preferences: No data recorded Abilities: No data recorded  Type of Services Patient Feels are Needed: No data recorded  Initial Clinical Notes/Concerns: No data recorded  Mental Health Symptoms Depression:   Change in energy/activity; Sleep (too much or little); Hopelessness; Irritability   Duration of Depressive symptoms:  Greater than two weeks   Mania:   Racing thoughts   Anxiety:    N/A   Psychosis:   None   Duration of Psychotic symptoms: No data recorded  Trauma:   N/A   Obsessions:   N/A   Compulsions:   N/A   Inattention:   N/A  Hyperactivity/Impulsivity:   N/A   Oppositional/Defiant Behaviors:   N/A   Emotional Irregularity:   N/A   Other Mood/Personality Symptoms:  No data recorded   Mental Status Exam Appearance and self-care  Stature:   Average   Weight:  No data recorded  Clothing:  No data recorded  Grooming:   Normal   Cosmetic use:   None   Posture/gait:  No data recorded  Motor activity:   Not Remarkable   Sensorium  Attention:   Normal   Concentration:   Normal   Orientation:   X5   Recall/memory:   Normal   Affect and Mood  Affect:   Depressed   Mood:   Depressed   Relating  Eye contact:   Normal   Facial expression:  No data recorded  Attitude toward examiner:   Cooperative   Thought and Language  Speech flow:  Clear and Coherent   Thought content:   Appropriate to Mood and Circumstances   Preoccupation:   None   Hallucinations:   None   Organization:  No data recorded   [employer] of Knowledge:   Fair   Intelligence:   Average   Abstraction:   Normal   Judgement:   Fair   Dance movement psychotherapist:  No data recorded  Insight:   Good   Decision Making:  No data recorded  Social Functioning  Social Maturity:   Irresponsible   Social Judgement:  No data recorded  Stress  Stressors:   Housing; Office manager Ability:   Deficient supports   Skill Deficits:   Responsibility   Supports:   Family     Religion: Religion/Spirituality Are You A Religious Person?: Yes What is Your Religious Affiliation?: Environmental consultant: Leisure / Recreation Do You Have Hobbies?: No  Exercise/Diet: Exercise/Diet Do You Exercise?: No Have You Gained or Lost A Significant Amount of Weight in the Past Six Months?: No Do You Follow a Special Diet?: No Do You Have Any Trouble Sleeping?: Yes Explanation of Sleeping Difficulties: Difficulty falling asleep and difficulty staying asleep   CCA Employment/Education Employment/Work Situation: Employment / Work Systems developer: On disability Why is Patient on Disability: Bipolar  Education: Education Is Patient Currently Attending School?: No Last Grade Completed: 9 Did You Product manager?: No Did You Have Any Difficulty At [employer]?: Yes   CCA Family/Childhood History Family and Relationship History: Family history Marital status: Single Does patient have children?: No  Childhood History:  Childhood History By whom was/is the patient raised?: Father Did patient suffer any verbal/emotional/physical/sexual abuse as a child?: No Did patient suffer from severe childhood neglect?: No Has patient ever been sexually abused/assaulted/raped as an adolescent or adult?: No Was the patient ever a victim of a crime or a disaster?: No Witnessed domestic violence?: No Has patient been affected by domestic violence as an adult?: No  Child/Adolescent Assessment:      CCA Substance Use Alcohol/Drug Use: Alcohol / Drug Use History of alcohol / drug use?: Yes (Patient reports extensive drug history)                         ASAM's:  Six Dimensions of Multidimensional Assessment  Dimension 1:  Acute Intoxication and/or Withdrawal Potential:      Dimension 2:  Biomedical Conditions and Complications:      Dimension 3:  Emotional, Behavioral, or Cognitive Conditions and Complications:     Dimension 4:  Readiness to  Change:     Dimension 5:  Relapse, Continued use, or Continued Problem Potential:     Dimension 6:  Recovery/Living Environment:     ASAM Severity Score:    ASAM Recommended Level of Treatment:     Substance use Disorder (SUD)    Recommendations for Services/Supports/Treatments:    DSM5 Diagnoses: There are no problems to display for this patient.     @BHCOLLABOFCARE @  , Counselor

## 2022-06-15 NOTE — ED Provider Notes (Signed)
[facility] Provider Note    Event Date/Time   First MD Initiated Contact with Patient 06/15/22 [phone]     (approximate)   History   Suicidal   HPI  Cody Young. is a 31 y.o. male who presents to the ED for evaluation of Suicidal    Patient presents with suicidal thoughts in the setting of increasing psychosocial stressors.  Reports getting kicked out of his apartment recently becoming homeless.  Has been off medications for months/years.  Currently on the streets and considering overdosing on recreational pills that he can buy on the streets.  Still having thoughts of doing this and wanted to be in a safe place.  Physical Exam   Triage Vital Signs: ED Triage Vitals  Enc Vitals Group     BP 06/15/22 0146 (!) 152/108     Pulse Rate 06/15/22 0146 100     Resp 06/15/22 0146 18     Temp 06/15/22 0146 (!) 97.5 F (36.4 C)     Temp Source 06/15/22 0146 Oral     SpO2 06/15/22 0146 98 %     Weight 06/15/22 0150 205 lb (93 kg)     Height 06/15/22 0150 6\' 2"  (1.88 m)     Head Circumference --      Peak Flow --      Pain Score 06/15/22 0219 0     Pain Loc --      Pain Edu? --      Excl. in GC? --     Most recent vital signs: Vitals:   06/15/22 0146  BP: (!) 152/108  Pulse: 100  Resp: 18  Temp: (!) 97.5 F (36.4 C)  SpO2: 98%    General: Awake, no distress.  Flat affect CV:  Good peripheral perfusion.  Resp:  Normal effort.  Abd:  No distention.  MSK:  No deformity noted.  Neuro:  No focal deficits appreciated. Other:     ED Results / Procedures / Treatments   Labs (all labs ordered are listed, but only abnormal results are displayed) Labs Reviewed  COMPREHENSIVE METABOLIC PANEL  ETHANOL  SALICYLATE LEVEL  ACETAMINOPHEN LEVEL  CBC  URINE DRUG SCREEN, QUALITATIVE ([facility] ONLY)    EKG   RADIOLOGY   Official radiology report(s): DG Knee Complete 4 Views Right  Result Date: 06/14/2022 CLINICAL DATA:  Low back pain  radiating to the knee. EXAM: RIGHT KNEE - COMPLETE 4 VIEW COMPARISON:  None Available. FINDINGS: No evidence of fracture, dislocation, or joint effusion. No evidence of arthropathy or other focal bone abnormality. Soft tissues are unremarkable. IMPRESSION: Negative. Electronically Signed   By: 06/16/2022 M.D.   On: 06/14/2022 04:13   DG Lumbar Spine Complete  Result Date: 06/14/2022 CLINICAL DATA:  Low back pain radiating to the knee EXAM: LUMBAR SPINE - COMPLETE 4+ VIEW COMPARISON:  None Available. FINDINGS: There is no evidence of lumbar spine fracture. Alignment is normal. T11-12 disc space narrowing and ventral endplate spurring. IMPRESSION: No acute finding or noted lumbar degeneration. Electronically Signed   By: 06/16/2022 M.D.   On: 06/14/2022 04:13    PROCEDURES and INTERVENTIONS:  Procedures  Medications - No data to display   IMPRESSION / MDM / ASSESSMENT AND PLAN / ED COURSE  I reviewed the triage vital signs and the nursing notes.  Differential diagnosis includes, but is not limited to, malingering, polysubstance abuse, suicidal  {Patient presents with symptoms of an acute illness or injury that  is potentially life-threatening.  31 year old male presents to the ED with SI with a plan in setting of increasing psychosocial stressors.  Will have psychiatry evaluate the patient.      FINAL CLINICAL IMPRESSION(S) / ED DIAGNOSES   Final diagnoses:  Suicidal ideation  Acute depression     Rx / DC Orders   ED Discharge Orders     None        Note:  This document was prepared using Dragon voice recognition software and may include unintentional dictation errors.   Vladimir Crofts, MD 06/15/22 [phone]

## 2022-06-15 NOTE — ED Notes (Signed)
Pt dressed out into hospital provided attire with this tech and [employer], EDT in the rm. Pt belongings consit of: one pair of tennis shoes, navy blue sweat pants, one red hoodie loose papers, keys, 2 vape pens, one lighter, and a debit card. Pt calm and cooperative while dressing out. Pt belongings placed into one pt belongings bag and labeled with pt name.

## 2022-06-15 NOTE — ED Notes (Signed)
Patient is vol pending re-evaluation

## 2022-06-15 NOTE — ED Triage Notes (Signed)
Pt ambulatory to triage reporting SI. Pt denies any specific plan. States " Ive just been off my meds for a while. " Pt calm and cooperative in triage.

## 2022-06-15 NOTE — ED Notes (Signed)
Pt states history of bipolar. States coming in yesterday was discharged and then came back in due to suicidal thoughts. Pt states his plan was to overdose on medications/pills. Pt in room. Calm and cooperative.

## 2022-06-15 NOTE — ED Notes (Addendum)
Patient's cousin called for update [phone] : Ladonna Snide?). Per patient, he does not want [place] to give her any information. Cousin states she has missing person report out on him. Informed her she is welcome to have officers contact ER to confirm his whereabouts. Informed cousin that I could not tell her anything about his care or reason for being here per his wishes and HIPAA laws.

## 2022-06-15 NOTE — ED Notes (Signed)
Report given to Dejea, RN 

## 2022-06-16 ENCOUNTER — Encounter: Payer: Self-pay | Admitting: Psychiatry

## 2022-06-16 ENCOUNTER — Inpatient Hospital Stay
Admission: AD | Admit: 2022-06-16 | Discharge: 2022-06-18 | DRG: 880 | Disposition: A | Discharge status: Home / Self Care | Payer: 59 | Source: Intra-hospital | Attending: Psychiatry | Admitting: Psychiatry

## 2022-06-16 ENCOUNTER — Other Ambulatory Visit: Payer: Self-pay

## 2022-06-16 ENCOUNTER — Inpatient Hospital Stay: Admission: EM | Admit: 2022-06-16 | Payer: Self-pay | Source: Ambulatory Visit | Admitting: Rehabilitation

## 2022-06-16 DIAGNOSIS — Z79899 Other long term (current) drug therapy: Secondary | ICD-10-CM | POA: Diagnosis not present

## 2022-06-16 DIAGNOSIS — Z9151 Personal history of suicidal behavior: Secondary | ICD-10-CM

## 2022-06-16 DIAGNOSIS — F1410 Cocaine abuse, uncomplicated: Secondary | ICD-10-CM | POA: Diagnosis present

## 2022-06-16 DIAGNOSIS — F339 Major depressive disorder, recurrent, unspecified: Secondary | ICD-10-CM | POA: Diagnosis present

## 2022-06-16 DIAGNOSIS — F17210 Nicotine dependence, cigarettes, uncomplicated: Secondary | ICD-10-CM | POA: Diagnosis present

## 2022-06-16 DIAGNOSIS — Z818 Family history of other mental and behavioral disorders: Secondary | ICD-10-CM

## 2022-06-16 DIAGNOSIS — U071 COVID-19: Secondary | ICD-10-CM | POA: Diagnosis not present

## 2022-06-16 DIAGNOSIS — R45851 Suicidal ideations: Principal | ICD-10-CM | POA: Diagnosis present

## 2022-06-16 DIAGNOSIS — F1010 Alcohol abuse, uncomplicated: Secondary | ICD-10-CM | POA: Diagnosis present

## 2022-06-16 DIAGNOSIS — F1110 Opioid abuse, uncomplicated: Secondary | ICD-10-CM | POA: Diagnosis present

## 2022-06-16 DIAGNOSIS — Z1152 Encounter for screening for COVID-19: Secondary | ICD-10-CM | POA: Diagnosis not present

## 2022-06-16 LAB — RESP PANEL BY RT-PCR (RSV, FLU A&B, COVID)  RVPGX2
Influenza A by PCR: NEGATIVE
Influenza B by PCR: NEGATIVE
Resp Syncytial Virus by PCR: NEGATIVE
SARS Coronavirus 2 by RT PCR: NEGATIVE

## 2022-06-16 MED ORDER — ALUM & MAG HYDROXIDE-SIMETH 200-200-20 MG/5ML PO SUSP: 30.0000 mL | ORAL | Status: DC | PRN

## 2022-06-16 MED ORDER — HYDROXYZINE HCL 25 MG PO TABS: 25.0000 mg | ORAL_TABLET | Freq: Three times a day (TID) | ORAL | Status: DC | PRN

## 2022-06-16 MED ORDER — CLONAZEPAM 0.25 MG PO TBDP
1.0000 mg | ORAL_TABLET | Freq: Every day | ORAL | Status: DC
  Administered 2022-06-16 – 2022-06-17 (×2): 1 mg via ORAL
  Filled 2022-06-16 (×2): qty 4

## 2022-06-16 MED ORDER — MAGNESIUM HYDROXIDE 400 MG/5ML PO SUSP: 30.0000 mL | Freq: Every day | ORAL | Status: DC | PRN

## 2022-06-16 MED ORDER — DIVALPROEX SODIUM 500 MG PO DR TAB
500.0000 mg | DELAYED_RELEASE_TABLET | Freq: Two times a day (BID) | ORAL | Status: DC
  Administered 2022-06-16 – 2022-06-18 (×5): 500 mg via ORAL
  Filled 2022-06-16 (×5): qty 1

## 2022-06-16 MED ORDER — ACETAMINOPHEN 325 MG PO TABS
650.0000 mg | ORAL_TABLET | Freq: Four times a day (QID) | ORAL | Status: DC | PRN
  Administered 2022-06-17 – 2022-06-18 (×2): 650 mg via ORAL
  Filled 2022-06-16 (×2): qty 2

## 2022-06-16 MED ORDER — ARIPIPRAZOLE 10 MG PO TABS
10.0000 mg | ORAL_TABLET | Freq: Every day | ORAL | Status: DC
  Administered 2022-06-16 – 2022-06-18 (×3): 10 mg via ORAL
  Filled 2022-06-16 (×3): qty 1

## 2022-06-16 MED ORDER — LOPERAMIDE HCL 2 MG PO CAPS: 2.0000 mg | ORAL_CAPSULE | ORAL | Status: DC | PRN

## 2022-06-16 NOTE — ED Provider Notes (Signed)
Emergency Medicine Observation Re-evaluation Note  Cody Young. is a 31 y.o. male, seen on rounds today.  Pt initially presented to the ED for complaints of Suicidal Currently, the patient is resting, voices no medical complaints.  Physical Exam  BP (!) 141/92 (BP Location: Left Arm)   Pulse 86   Temp 97.8 F (36.6 C) (Oral)   Resp 16   Ht 6\' 2"  (1.88 m)   Wt 93 kg   SpO2 96%   BMI 26.32 kg/m  Physical Exam General: Resting in no acute distress Cardiac: No cyanosis Lungs: Equal rise and fall Psych: Not agitated  ED Course / MDM  EKG:   I have reviewed the labs performed to date as well as medications administered while in observation.  Recent changes in the last 24 hours include no events overnight.  Plan  Current plan is for psychiatric disposition.    , MD 06/16/22 [phone]

## 2022-06-16 NOTE — [facility] Group Notes (Signed)
[facility] Group Notes:  (Nursing/MHT/Case Management/Adjunct)  Date:  06/16/2022  Time:  8:45 PM  Type of Therapy:   Wrap up  Participation Level:  Did Not Attend   Cody Young 06/16/2022, 8:45 PM
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
## 2022-06-16 NOTE — Plan of Care (Signed)
Patient alert and talkative on assessment.  Patient is medication compliant.  Patient out of room for snacks and medication.  Patient Denies SI/HI and VH but does endorse AH but states voices are not telling him to hurt himself or anyone else.  Patient rates his anxiety and depression 10/10 for being being hospitalized.  Patient states he has chronic generalized pain that is somewhat relieved by medication.  Patient can contract for safety.  Q 15 minute rounds in progress, will continue to monitor. Problem: Education: Goal: Knowledge of General Education information will improve Description: Including pain rating scale, medication(s)/side effects and non-pharmacologic comfort measures Outcome: Progressing   Problem: Activity: Goal: Risk for activity intolerance will decrease Outcome: Progressing   Problem: Nutrition: Goal: Adequate nutrition will be maintained Outcome: Progressing   Problem: Coping: Goal: Level of anxiety will decrease Outcome: Progressing   Problem: Safety: Goal: Ability to remain free from injury will improve Outcome: Progressing   Problem: Coping: Goal: Ability to demonstrate self-control will improve Outcome: Progressing

## 2022-06-16 NOTE — Progress Notes (Signed)
Patient ID: Cody Walgren., male   DOB: [birth year], 31 y.o.   MRN: [id number] Admission note: Patient is a 31 year old male, presents voluntary per report of suicidal ideation. Patient is alert and oriented to unit. Patients affect is anxious. Patient is cooperative during assessment questions. Patient presents with healed scars on the back of his neck, lower right back, and back of calf. Patient rates pain 10/10 for lower spine and knee pain.  Patient states that reason for admission is due to "mental episode "that made him want to commit suicide by overdosing on narcotics. He came to the hospital for mental health help. Patient does have a history of a previous suicide attempt by "slitting" his wrists at the age of 48.  During admission assessment patient repeatedly referred to the medications that he used to take.  Patient states that he has been clean for 10 years from cocaine but relapsed 2 days ago during his "mental episode." Patient denies stressors related to admission at this time. Patient presents anxious but cooperative.  Patient currently denies SI/HI.VH. But if he was "out on the streets" he would have thoughts of hurting himself. Patient states that when he does have thoughts of wanting to harm himself it is by using drugs. Patient endorsed auditory hallucinations stating that the voices are "people talking" mainly his mom and day. Patient states that the voices are "non related and delusional."  Unit policies explained and verbalized understanding. Q15 minute checks maintained and will continue to monitor.

## 2022-06-16 NOTE — Tx Team (Signed)
Initial Treatment Plan 06/16/2022 1:35 PM Cody Young. PVV:[id number]    PATIENT STRESSORS: Medication change or noncompliance     PATIENT STRENGTHS: Motivation for treatment/growth  Supportive family/friends    PATIENT IDENTIFIED PROBLEMS: Anxiety, depression, nervousness                      DISCHARGE CRITERIA:  Improved stabilization in mood, thinking, and/or behavior  PRELIMINARY DISCHARGE PLAN: Placement in alternative living arrangements  PATIENT/FAMILY INVOLVEMENT: This treatment plan has been presented to and reviewed with the patient, Cody Young., and/or family member. The patient and family have been given the opportunity to ask questions and make suggestions.  Hyman Hopes, RN 06/16/2022, 1:35 PM

## 2022-06-16 NOTE — ED Notes (Signed)
VOL/pending reassessment 

## 2022-06-16 NOTE — Consult Note (Signed)
[facility] Psych ED Progress Note  06/16/2022 9:35 AM Cody Young.  MRN:  [id number]   Method of visit?: Face to Face   Subjective:  "I feel the same way as yesterday." Patient reports that maybe he feels a little more hopeful after he talked to his mother, not "quite as suicidal" but still very depressed. Patient has a somewhat odd affect. He reports that he sometimes "hears voices."   Recommend inpatient psychiatric hospitalization.    Principal Problem: Verbalizes suicidal thoughts Diagnosis:  Principal Problem:   Verbalizes suicidal thoughts Active Problems:   Depression   Cocaine abuse (HCC)  Total Time spent with patient: 15 minutes  Past Psychiatric History: SEE PREVIOUS  Past Medical History: No past medical history on file. No past surgical history on file. Family History: No family history on file. Family Psychiatric  History:  Social History:  Social History   Substance and Sexual Activity  Alcohol Use Yes     Social History   Substance and Sexual Activity  Drug Use Yes   Types: Marijuana    Social History   Socioeconomic History   Marital status: Single    Spouse name: Not on file   Number of children: Not on file   Years of education: Not on file   Highest education level: Not on file  Occupational History   Not on file  Tobacco Use   Smoking status: Every Day    Packs/day: 1.00    Types: Cigarettes   Smokeless tobacco: Never  Substance and Sexual Activity   Alcohol use: Yes   Drug use: Yes    Types: Marijuana   Sexual activity: Not on file  Other Topics Concern   Not on file  Social History Narrative   Not on file   Social Determinants of Health   Financial Resource Strain: Not on file  Food Insecurity: Not on file  Transportation Needs: Not on file  Physical Activity: Not on file  Stress: Not on file  Social Connections: Not on file    Sleep: Good  Appetite:  Good  Current Medications: No current facility-administered  medications for this encounter.   Current Outpatient Medications  Medication Sig Dispense Refill   ibuprofen (ADVIL) 600 MG tablet Take 1 tablet (600 mg total) by mouth every 8 (eight) hours as needed for moderate pain. With food 30 tablet 0    Lab Results:  Results for orders placed or performed during the hospital encounter of 06/15/22 (from the past 48 hour(s))  Comprehensive metabolic panel     Status: Abnormal   Collection Time: 06/15/22  1:51 AM  Result Value Ref Range   Sodium 138 135 - 145 mmol/L   Potassium 3.8 3.5 - 5.1 mmol/L   Chloride 101 98 - 111 mmol/L   CO2 26 22 - 32 mmol/L   Glucose, Bld 86 70 - 99 mg/dL    Comment: Glucose reference range applies only to samples taken after fasting for at least 8 hours.   BUN 19 6 - 20 mg/dL   Creatinine, Ser 1.75 0.61 - 1.24 mg/dL   Calcium 9.1 8.9 - 10.2 mg/dL   Total Protein 7.5 6.5 - 8.1 g/dL   Albumin 4.5 3.5 - 5.0 g/dL   AST 49 (H) 15 - 41 U/L   ALT 30 0 - 44 U/L   Alkaline Phosphatase 64 38 - 126 U/L   Total Bilirubin 2.8 (H) 0.3 - 1.2 mg/dL   GFR, Estimated >58 >52 mL/min  Comment: (NOTE) Calculated using the CKD-EPI Creatinine Equation (2021)    Anion gap 11 5 - 15    Comment: Performed at [facility], [address] [place], [place] [address]  Ethanol     Status: None   Collection Time: 06/15/22  1:51 AM  Result Value Ref Range   Alcohol, Ethyl (B) <10 <10 mg/dL    Comment: (NOTE) Lowest detectable limit for serum alcohol is 10 mg/dL.  For medical purposes only. Performed at [facility], [address] [place], [place] [address]   Salicylate level     Status: Abnormal   Collection Time: 06/15/22  1:51 AM  Result Value Ref Range   Salicylate Lvl <7.0 (L) 7.0 - 30.0 mg/dL    Comment: Performed at [facility], [address] [place], [place] [address]  Acetaminophen level     Status: Abnormal   Collection Time: 06/15/22  1:51 AM  Result Value Ref Range   Acetaminophen  (Tylenol), Serum <10 (L) 10 - 30 ug/mL    Comment: (NOTE) Therapeutic concentrations vary significantly. A range of 10-30 ug/mL  may be an effective concentration for many patients. However, some  are best treated at concentrations outside of this range. Acetaminophen concentrations >150 ug/mL at 4 hours after ingestion  and >50 ug/mL at 12 hours after ingestion are often associated with  toxic reactions.  Performed at [facility], [address] [place], [place] [address]   cbc     Status: None   Collection Time: 06/15/22  1:51 AM  Result Value Ref Range   WBC 9.9 4.0 - 10.5 K/uL   RBC 5.38 4.22 - 5.81 MIL/uL   Hemoglobin 16.8 13.0 - 17.0 g/dL   HCT 66.4 40.3 - 47.4 %   MCV 95.7 80.0 - 100.0 fL   MCH 31.2 26.0 - 34.0 pg   MCHC 32.6 30.0 - 36.0 g/dL   RDW 25.9 56.3 - 87.5 %   Platelets 189 150 - 400 K/uL   nRBC 0.0 0.0 - 0.2 %    Comment: Performed at [facility], [address] [place], [place] [address]  Urine Drug Screen, Qualitative ([facility] only)     Status: Abnormal   Collection Time: 06/15/22  1:51 AM  Result Value Ref Range   Tricyclic, Ur Screen NONE DETECTED NONE DETECTED   Amphetamines, Ur Screen NONE DETECTED NONE DETECTED   MDMA (Ecstasy)Ur Screen NONE DETECTED NONE DETECTED   Cocaine Metabolite,Ur [place] POSITIVE (A) NONE DETECTED   Opiate, Ur Screen NONE DETECTED NONE DETECTED   Phencyclidine (PCP) Ur S NONE DETECTED NONE DETECTED   Cannabinoid 50 Ng, Ur [place] POSITIVE (A) NONE DETECTED   Barbiturates, Ur Screen NONE DETECTED NONE DETECTED   Benzodiazepine, Ur Scrn NONE DETECTED NONE DETECTED   Methadone Scn, Ur NONE DETECTED NONE DETECTED    Comment: (NOTE) Tricyclics + metabolites, urine    Cutoff 1000 ng/mL Amphetamines + metabolites, urine  Cutoff 1000 ng/mL MDMA (Ecstasy), urine              Cutoff 500 ng/mL Cocaine Metabolite, urine          Cutoff 300 ng/mL Opiate + metabolites, urine        Cutoff 300 ng/mL Phencyclidine (PCP), urine          Cutoff 25 ng/mL Cannabinoid, urine                 Cutoff 50 ng/mL Barbiturates + metabolites, urine  Cutoff  200 ng/mL Benzodiazepine, urine              Cutoff 200 ng/mL Methadone, urine                   Cutoff 300 ng/mL  The urine drug screen provides only a preliminary, unconfirmed analytical test result and should not be used for non-medical purposes. Clinical consideration and professional judgment should be applied to any positive drug screen result due to possible interfering substances. A more specific alternate chemical method must be used in order to obtain a confirmed analytical result. Gas chromatography / mass spectrometry (GC/MS) is the preferred confirm atory method. Performed at [facility], [address] [place], [place] [address]     Blood Alcohol level:  Lab Results  Component Value Date   [facility] <10 06/15/2022    Physical Findings: AIMS:  , ,  ,  ,    CIWA:    COWS:     Musculoskeletal: Strength & Muscle Tone: within normal limits Gait & Station: normal Patient leans: N/A  Psychiatric Specialty Exam:  Presentation  General Appearance:  Casual  Eye Contact: Good  Speech: Clear and Coherent  Speech Volume: Normal  Handedness: Right   Mood and Affect  Mood: Depressed  Affect: Congruent; Blunt   Thought Process  Thought Processes: Coherent  Descriptions of Associations:Intact  Orientation:Full (Time, Place and Person)  Thought Content:WDL  History of Schizophrenia/Schizoaffective disorder:No  Duration of Psychotic Symptoms:No data recorded Hallucinations:Hallucinations: None  Ideas of Reference:None  Suicidal Thoughts:Suicidal Thoughts: Yes, Passive SI Passive Intent and/or Plan: With Plan  Homicidal Thoughts:Homicidal Thoughts: No   Sensorium  Memory: Immediate Fair  Judgment: Fair  Insight: Fair   Art therapist  Concentration: Fair  Attention  Span: Good  Recall: [employer] of Knowledge: Fair  Language: Fair   Psychomotor Activity  Psychomotor Activity: Psychomotor Activity: Normal   Assets  Assets: Housing; Physical Health; Resilience; Social Support   Sleep  Sleep: Sleep: Poor    Physical Exam: Physical Exam Vitals and nursing note reviewed.  HENT:     Head: Normocephalic.     Nose: No congestion or rhinorrhea.  Eyes:     General:        Right eye: No discharge.        Left eye: No discharge.  Cardiovascular:     Rate and Rhythm: Normal rate.  Pulmonary:     Effort: Pulmonary effort is normal.  Musculoskeletal:        General: Normal range of motion.     Cervical back: Normal range of motion.  Neurological:     Mental Status: He is alert and oriented to person, place, and time.  Psychiatric:        Attention and Perception: Attention normal.        Mood and Affect: Mood is depressed. Affect is blunt.        Speech: Speech normal.        Behavior: Behavior is cooperative.        Thought Content: Thought content includes suicidal (passive) ideation.        Cognition and Memory: Cognition normal.        Judgment: Judgment normal.    Review of Systems  Constitutional: Negative.   HENT: Negative.    Respiratory: Negative.    Cardiovascular: Negative.   Psychiatric/Behavioral:  Positive for depression, substance abuse and suicidal ideas.    Blood pressure 124/84, pulse 76, temperature 97.9 F (36.6  C), temperature source Oral, resp. rate 18, height 6\' 2"  (1.88 m), weight 93 kg, SpO2 95 %. Body mass index is 26.32 kg/m.  Treatment Plan Summary: Plan Recommend inpatient psychiatric hospitalization. Reviewed with EDP  Vanetta MuldersLouise F Gerldine Suleiman, NP 06/16/2022, 9:35 AM

## 2022-06-16 NOTE — H&P (Addendum)
Psychiatric Admission Assessment Adult  Patient Identification: Cody Young. MRN:  [id number] Date of Evaluation:  06/16/2022 Chief Complaint:  Suicidal ideation [R45.851] Principal Diagnosis: Suicidal ideation Diagnosis:   Schizoaffective disorder, bipolar type Rule out substance induced mood disorder Alcohol use disorder Cocaine use disorder Opiate use disorder Tobacco use disorder  History of Present Illness:  Cody Young is a 31 year old male with a history of bipolar disorder resented to the emergency department on a voluntary basis due to suicidal ideations with a plan to overdose on cocaine.  Reports being off of all psychiatric medications a year and a half ago when he moved to [facility] with his mother because he was not able to secure insurance at the time.  Patient reports being on Abilify LAI, Klonopin 1 mg nightly, and Depakote 500 mg twice daily with good benefit.  He has a history of drug use as well, will use cocaine and Percocets approximately once a month, "when I have money."  Patient was placed on disability approximately 10 years ago for his mental health.    He gets $914 per month and had been residing at a boarding house but was recently kicked out 3 days ago due to failure to pay adequate rent.  States that the living conditions there were an inadequate.  Patient indicates going on a bender of alcohol, cocaine and cannabis because he was sad after he was removed from the home.  Moreover, he had to spend Christmas day alone because his mother did not want him over while he was high on drugs.  Patient seeks help and states "I need help with my mental as well as my drug use."  He has never been to a drug rehab before but is open to this.  Patient reports poor sleep for the past 3 days, his baseline is usually 6 hours.  Reports poor appetite over several weeks.  Notably, indicates that he experiences visual hallucinations and auditory hallucinations for  most days of the week since he has been off of medications for a year and a half.  States "I see spiritual lines to move."  He denies any history of violence, no homicidal ideations.  He is prone to anxiety with physical concomitants including chest pain and general muscle pain  Patient also uses Delta 8.   Associated Signs/Symptoms: Depression Symptoms:  psychomotor retardation, (Hypo) Manic Symptoms:  Delusions, Anxiety Symptoms:  Excessive Worry, Psychotic Symptoms:  Hallucinations: Auditory Visual  Total Time spent with patient: 1 hour  Past Psychiatric History:  The patient's second psychiatric authorization, he was admitted when he was 30 years old.  He had 1 suicide attempt at that time.  No psychiatric services at this time.  Notes that he will also use cannabis and delta 8.  He will drink 2-3 beers per day as well as a pint a span of a week.  Is the patient at risk to self? Yes.    Has the patient been a risk to self in the past 6 months? Yes.    Has the patient been a risk to self within the distant past? Yes.    Is the patient a risk to others? No.  Has the patient been a risk to others in the past 6 months? Yes.    Has the patient been a risk to others within the distant past? No.   [place] Scale:  Flowsheet Row Admission (Current) from 06/16/2022 in [facility] ED from 06/15/2022 in [facility] from [facility]/14[facility]2022 [facility]  C-SSRS RISK CATEGORY High Risk High Risk No Risk     Alcohol Screening:   Substance Abuse History in the last 12 months:  Yes.   Consequences of Substance Abuse: Family Consequences:  mother Previous Psychotropic Medications: Yes  Psychological Evaluations: Yes  Past Medical History: No past medical history on file. No past surgical history on file. Family History: No family history on file. Family Psychiatric  History: mother  depression, p uncle committed suicide.  Tobacco Screening:  Social History   Tobacco Use  Smoking Status Every Day   Packs/day: 1.00   Types: Cigarettes  Smokeless Tobacco Never    BH Tobacco Counseling     Are you interested in Tobacco Cessation Medications?  No value filed. Counseled patient on smoking cessation:  No value filed. Reason Tobacco Screening Not Completed: No value filed.       Social History:  Social History   Substance and Sexual Activity  Alcohol Use Yes     Social History   Substance and Sexual Activity  Drug Use Yes   Types: Marijuana    Additional Social History:                           Allergies:  No Known Allergies Lab Results:  Results for orders placed or performed during the hospital encounter of 06/15/22 (from the past 48 hour(s))  Comprehensive metabolic panel     Status: Abnormal   Collection Time: 06/15/22  1:51 AM  Result Value Ref Range   Sodium 138 135 - 145 mmol/L   Potassium 3.8 3.5 - 5.1 mmol/L   Chloride 101 98 - 111 mmol/L   CO2 26 22 - 32 mmol/L   Glucose, Bld 86 70 - 99 mg/dL    Comment: Glucose reference range applies only to samples taken after fasting for at least 8 hours.   BUN 19 6 - 20 mg/dL   Creatinine, Ser 7.840.91 0.61 - 1.24 mg/dL   Calcium 9.1 8.9 - 69.610.3 mg/dL   Total Protein 7.5 6.5 - 8.1 g/dL   Albumin 4.5 3.5 - 5.0 g/dL   AST 49 (H) 15 - 41 U/L   ALT 30 0 - 44 U/L   Alkaline Phosphatase 64 38 - 126 U/L   Total Bilirubin 2.8 (H) 0.3 - 1.2 mg/dL   GFR, Estimated >29>60 >52>60 mL/min    Comment: (NOTE) Calculated using the CKD-EPI Creatinine Equation (2021)    Anion gap 11 5 - 15    Comment: Performed at [facility], [address] [place], [place] [address]  Ethanol     Status: None   Collection Time: 06/15/22  1:51 AM  Result Value Ref Range   Alcohol, Ethyl (B) <10 <10 mg/dL    Comment: (NOTE) Lowest detectable limit for serum alcohol is 10 mg/dL.  For medical purposes  only. Performed at [facility], [address] [place], [place] [address]   Salicylate level     Status: Abnormal   Collection Time: 06/15/22  1:51 AM  Result Value Ref Range   Salicylate Lvl <7.0 (L) 7.0 - 30.0 mg/dL    Comment: Performed at [facility], [address] [place], [place] [address]  Acetaminophen level     Status: Abnormal   Collection Time: 06/15/22  1:51 AM  Result Value Ref Range   Acetaminophen (Tylenol), Serum <  10 (L) 10 - 30 ug/mL    Comment: (NOTE) Therapeutic concentrations vary significantly. A range of 10-30 ug/mL  may be an effective concentration for many patients. However, some  are best treated at concentrations outside of this range. Acetaminophen concentrations >150 ug/mL at 4 hours after ingestion  and >50 ug/mL at 12 hours after ingestion are often associated with  toxic reactions.  Performed at [facility], [address] [place], [place] [address]   cbc     Status: None   Collection Time: 06/15/22  1:51 AM  Result Value Ref Range   WBC 9.9 4.0 - 10.5 K/uL   RBC 5.38 4.22 - 5.81 MIL/uL   Hemoglobin 16.8 13.0 - 17.0 g/dL   HCT 82.9 93.7 - 16.9 %   MCV 95.7 80.0 - 100.0 fL   MCH 31.2 26.0 - 34.0 pg   MCHC 32.6 30.0 - 36.0 g/dL   RDW 67.8 93.8 - 10.1 %   Platelets 189 150 - 400 K/uL   nRBC 0.0 0.0 - 0.2 %    Comment: Performed at [facility], [address], [place], [place] [address]  Urine Drug Screen, Qualitative ([facility] only)     Status: Abnormal   Collection Time: 06/15/22  1:51 AM  Result Value Ref Range   Tricyclic, Ur Screen NONE DETECTED NONE DETECTED   Amphetamines, Ur Screen NONE DETECTED NONE DETECTED   MDMA (Ecstasy)Ur Screen NONE DETECTED NONE DETECTED   Cocaine Metabolite,Ur [place] POSITIVE (A) NONE DETECTED   Opiate, Ur Screen NONE DETECTED NONE DETECTED   Phencyclidine (PCP) Ur S NONE DETECTED NONE DETECTED   Cannabinoid 50 Ng, Ur [place] POSITIVE (A) NONE DETECTED   Barbiturates, Ur  Screen NONE DETECTED NONE DETECTED   Benzodiazepine, Ur Scrn NONE DETECTED NONE DETECTED   Methadone Scn, Ur NONE DETECTED NONE DETECTED    Comment: (NOTE) Tricyclics + metabolites, urine    Cutoff 1000 ng/mL Amphetamines + metabolites, urine  Cutoff 1000 ng/mL MDMA (Ecstasy), urine              Cutoff 500 ng/mL Cocaine Metabolite, urine          Cutoff 300 ng/mL Opiate + metabolites, urine        Cutoff 300 ng/mL Phencyclidine (PCP), urine         Cutoff 25 ng/mL Cannabinoid, urine                 Cutoff 50 ng/mL Barbiturates + metabolites, urine  Cutoff 200 ng/mL Benzodiazepine, urine              Cutoff 200 ng/mL Methadone, urine                   Cutoff 300 ng/mL  The urine drug screen provides only a preliminary, unconfirmed analytical test result and should not be used for non-medical purposes. Clinical consideration and professional judgment should be applied to any positive drug screen result due to possible interfering substances. A more specific alternate chemical method must be used in order to obtain a confirmed analytical result. Gas chromatography / mass spectrometry (GC/MS) is the preferred confirm atory method. Performed at [facility], [address] [place], [place] [address]   Resp panel by RT-PCR (RSV, Flu A&B, Covid) Anterior Nasal Swab     Status: None   Collection Time: 06/16/22  9:55 AM   Specimen: Anterior Nasal Swab  Result Value Ref Range   SARS Coronavirus 2 by RT PCR NEGATIVE  NEGATIVE    Comment: (NOTE) SARS-CoV-2 target nucleic acids are NOT DETECTED.  The SARS-CoV-2 RNA is generally detectable in upper respiratory specimens during the acute phase of infection. The lowest concentration of SARS-CoV-2 viral copies this assay can detect is 138 copies/mL. A negative result does not preclude SARS-Cov-2 infection and should not be used as the sole basis for treatment or other patient management decisions. A negative result may occur with   improper specimen collection/handling, submission of specimen other than nasopharyngeal swab, presence of viral mutation(s) within the areas targeted by this assay, and inadequate number of viral copies(<138 copies/mL). A negative result must be combined with clinical observations, patient history, and epidemiological information. The expected result is Negative.  Fact Sheet for Patients:  BloggerCourse.com  Fact Sheet for Healthcare Providers:  SeriousBroker.it  This test is no t yet approved or cleared by the [place] FDA and  has been authorized for detection and/or diagnosis of SARS-CoV-2 by FDA under an Emergency Use Authorization (EUA). This EUA will remain  in effect (meaning this test can be used) for the duration of the COVID-19 declaration under Section 564(b)(1) of the Act, 21 U.S.C.section 360bbb-3(b)(1), unless the authorization is terminated  or revoked sooner.       Influenza A by PCR NEGATIVE NEGATIVE   Influenza B by PCR NEGATIVE NEGATIVE    Comment: (NOTE) The Xpert Xpress SARS-CoV-2/FLU/RSV plus assay is intended as an aid in the diagnosis of influenza from Nasopharyngeal swab specimens and should not be used as a sole basis for treatment. Nasal washings and aspirates are unacceptable for Xpert Xpress SARS-CoV-2/FLU/RSV testing.  Fact Sheet for Patients: BloggerCourse.com  Fact Sheet for Healthcare Providers: SeriousBroker.it  This test is not yet approved or cleared by the [place] FDA and has been authorized for detection and/or diagnosis of SARS-CoV-2 by FDA under an Emergency Use Authorization (EUA). This EUA will remain in effect (meaning this test can be used) for the duration of the COVID-19 declaration under Section 564(b)(1) of the Act, 21 U.S.C. section 360bbb-3(b)(1), unless the authorization is terminated or revoked.     Resp  Syncytial Virus by PCR NEGATIVE NEGATIVE    Comment: (NOTE) Fact Sheet for Patients: BloggerCourse.com  Fact Sheet for Healthcare Providers: SeriousBroker.it  This test is not yet approved or cleared by the [place] FDA and has been authorized for detection and/or diagnosis of SARS-CoV-2 by FDA under an Emergency Use Authorization (EUA). This EUA will remain in effect (meaning this test can be used) for the duration of the COVID-19 declaration under Section 564(b)(1) of the Act, 21 U.S.C. section 360bbb-3(b)(1), unless the authorization is terminated or revoked.  Performed at [facility], [address] [place], [place] [address]     Blood Alcohol level:  Lab Results  Component Value Date   [facility] <10 06/15/2022    Metabolic Disorder Labs:  No results found for: "HGBA1C", "MPG" No results found for: "PROLACTIN" No results found for: "CHOL", "TRIG", "HDL", "CHOLHDL", "VLDL", "LDLCALC"  Current Medications: Current Facility-Administered Medications  Medication Dose Route Frequency Provider Last Rate Last Admin   acetaminophen (TYLENOL) tablet 650 mg  650 mg Oral Q6H PRN Vanetta Mulders, NP       alum & mag hydroxide-simeth (MAALOX/MYLANTA) 200-200-20 MG/5ML suspension 30 mL  30 mL Oral Q4H PRN Gabriel Cirri F, NP       hydrOXYzine (ATARAX) tablet 25 mg  25 mg Oral TID PRN Vanetta Mulders, NP  magnesium hydroxide (MILK OF MAGNESIA) suspension 30 mL  30 mL Oral Daily PRN Vanetta Mulders, NP       PTA Medications: Medications Prior to Admission  Medication Sig Dispense Refill Last Dose   ibuprofen (ADVIL) 600 MG tablet Take 1 tablet (600 mg total) by mouth every 8 (eight) hours as needed for moderate pain. With food 30 tablet 0     Musculoskeletal: Strength & Muscle Tone: within normal limits Gait & Station: normal Patient leans: Left            Psychiatric Specialty  Exam:  Presentation  General Appearance:  Casual  Eye Contact: Good  Speech: Clear and Coherent  Speech Volume: Normal  Handedness: Right   Mood and Affect  Mood: Depressed  Affect: Congruent; Blunt   Thought Process  Thought Processes: Coherent  Duration of Psychotic Symptoms:N/A Past Diagnosis of Schizophrenia or Psychoactive disorder: No  Descriptions of Associations:Intact  Orientation:Full (Time, Place and Person)  Thought Content:WDL  Hallucinations:Hallucinations: None  Ideas of Reference:None  Suicidal Thoughts:Suicidal Thoughts: Yes, Passive SI Passive Intent and/or Plan: With Plan  Homicidal Thoughts:Homicidal Thoughts: No   Sensorium  Memory: Immediate Fair  Judgment: Fair  Insight: Fair   Art therapist  Concentration: Fair  Attention Span: Good  Recall: [employer] of Knowledge: Fair  Language: Fair   Psychomotor Activity  Psychomotor Activity: Psychomotor Activity: Normal   Assets  Assets: Housing; Physical Health; Resilience; Social Support   Sleep  Sleep: Sleep: Poor    Physical Exam: Physical Exam ROS Blood pressure (!) 140/86, pulse 62, temperature 97.9 F (36.6 C), temperature source Oral, resp. rate 18, height 6\' 2"  (1.88 m), weight 109.3 kg, SpO2 99 %. Body mass index is 30.94 kg/m.  Treatment Plan Summary: Daily contact with patient to assess and evaluate symptoms and progress in treatment and Medication management  Observation Level/Precautions:    Laboratory:    Psychotherapy:    Medications:    Consultations:    Discharge Concerns:    Estimated LOS:  Other:     Physician Treatment Plan for Primary Diagnosis: Suicidal ideation Long Term Goal(s): Improvement in symptoms so as ready for discharge  Short Term Goals: Ability to verbalize feelings will improve and Ability to disclose and discuss suicidal ideas  Physician Treatment Plan for Secondary Diagnosis: Principal  Problem:   Suicidal ideation  Long Term Goal(s): Improvement in symptoms so as ready for discharge  Short Term Goals: Ability to disclose and discuss suicidal ideas  12/26 Start Abilify 10 mg p.o. daily Start Depakote 500 mg p.o. twice daily Start Klonopin 1 mg p.o. nightly while he is admitted to the hospital. Risk-benefit side effects and alternatives on all medications.  Abilify on Reviewed metabolic disturbance sedation and weight gain EPS and MS Monitor patient sleep Establish and maintain the therapy alliance with patient on Right patient with structure and support Consider naltrexone as an option for patient Patient interested in chemical dependency treatment And collateral information Obtain lipid panel and hemoglobin A1c   I certify that inpatient services furnished can reasonably be expected to improve the patient's condition.    1/27, MD 12/26/20231:37 PM

## 2022-06-16 NOTE — Group Note (Signed)
LCSW Group Therapy Note  Group Date: 06/16/2022 Start Time: 1300 End Time: 1400   Type of Therapy and Topic:  Group Therapy - How To Cope with Nervousness about Discharge   Participation Level:  Did Not Attend   Description of Group This process group involved identification of patients' feelings about discharge. Some of them are scheduled to be discharged soon, while others are new admissions, but each of them was asked to share thoughts and feelings surrounding discharge from the hospital. One common theme was that they are excited at the prospect of going home, while another was that many of them are apprehensive about sharing why they were hospitalized. Patients were given the opportunity to discuss these feelings with their peers in preparation for discharge.  Therapeutic Goals  Patient will identify their overall feelings about pending discharge. Patient will think about how they might proactively address issues that they believe will once again arise once they get home (i.e. with parents). Patients will participate in discussion about having hope for change.   Summary of Patient Progress:   Patient declined to attend group, though invited by this clinician.    Therapeutic Modalities Cognitive Behavioral Therapy   Sher, Hellinger 06/16/2022  1:31 PM

## 2022-06-17 DIAGNOSIS — R45851 Suicidal ideations: Secondary | ICD-10-CM | POA: Diagnosis not present

## 2022-06-17 NOTE — Plan of Care (Signed)
  Problem: Education: Goal: Knowledge of [facility] Education information/materials will improve Outcome: Progressing Goal: Mental status will improve Outcome: Progressing   Problem: Activity: Goal: Sleeping patterns will improve Outcome: Progressing

## 2022-06-17 NOTE — Progress Notes (Signed)
Recreation Therapy Notes      Date: 06/17/2022   Time: 10:25 am   Location: Craft room      Behavioral response: N/A   Intervention Topic: Values    Discussion/Intervention: Patient refused to attend group.    Clinical Observations/Feedback:  Patient refused to attend group.    Jazmen Lindenbaum LRT/CTRS        Gardner Servantes 06/17/2022 11:14 AM
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
## 2022-06-17 NOTE — Progress Notes (Signed)
Pt denies SI/HI/AVH and verbally agrees to approach staff if these become apparent or before harming themselves/others. Rates depression 8/10. Rates anxiety 9/10. Rates pain 10/10. Pt has been in his room for most of the day. Pt signed a 72  hour form at 1420. Pt states that he just came to get started back on his medications. Scheduled medications administered to pt, per MD orders. RN provided support and encouragement to pt. Q15 min safety checks implemented and continued. Pt safe on the unit. RN will continue to monitor and intervene as needed.  06/17/22 0755  Psych Admission Type (Psych Patients Only)  Admission Status Voluntary  Psychosocial Assessment  Patient Complaints Anxiety;Depression  Eye Contact Fair  Facial Expression Anxious;Sad  Affect Anxious;Depressed  Speech Logical/coherent  Interaction Superficial;Isolative  Motor Activity Other (Comment) (WDL)  Appearance/Hygiene In scrubs;Unremarkable  Behavior Characteristics Cooperative;Appropriate to situation  Mood Anxious;Depressed  Thought Process  Coherency WDL  Content WDL  Delusions None reported or observed  Perception WDL;Hallucinations  Hallucination None reported or observed  Judgment Impaired  Confusion None  Danger to Self  Current suicidal ideation? Denies  Danger to Others  Danger to Others None reported or observed

## 2022-06-17 NOTE — BH IP Treatment Plan (Signed)
Interdisciplinary Treatment and Diagnostic Plan Update  06/17/2022 Time of Session: 9:00AM Cody Young. MRN: [id number]  Principal Diagnosis: Suicidal ideation  Secondary Diagnoses: Principal Problem:   Suicidal ideation   Current Medications:  Current Facility-Administered Medications  Medication Dose Route Frequency Provider Last Rate Last Admin   acetaminophen (TYLENOL) tablet 650 mg  650 mg Oral Q6H PRN Sherlon Handing, NP   650 mg at 06/17/22 0755   alum & mag hydroxide-simeth (MAALOX/MYLANTA) 200-200-20 MG/5ML suspension 30 mL  30 mL Oral Q4H PRN Sherlon Handing, NP       ARIPiprazole (ABILIFY) tablet 10 mg  10 mg Oral Daily Rulon Sera, MD   10 mg at 06/17/22 0755   clonazePAM (KLONOPIN) disintegrating tablet 1 mg  1 mg Oral QHS Rulon Sera, MD   1 mg at 06/16/22 2143   divalproex (DEPAKOTE) DR tablet 500 mg  500 mg Oral BID Rulon Sera, MD   500 mg at 06/17/22 0755   hydrOXYzine (ATARAX) tablet 25 mg  25 mg Oral TID PRN Sherlon Handing, NP       loperamide (IMODIUM) capsule 2 mg  2 mg Oral PRN Rulon Sera, MD       magnesium hydroxide (MILK OF MAGNESIA) suspension 30 mL  30 mL Oral Daily PRN Sherlon Handing, NP       PTA Medications: Medications Prior to Admission  Medication Sig Dispense Refill Last Dose   ibuprofen (ADVIL) 600 MG tablet Take 1 tablet (600 mg total) by mouth every 8 (eight) hours as needed for moderate pain. With food 30 tablet 0 prn    Patient Stressors: Medication change or noncompliance    Patient Strengths: Motivation for treatment/growth  Supportive family/friends   Treatment Modalities: Medication Management, Group therapy, Case management,  1 to 1 session with clinician, Psychoeducation, Recreational therapy.   Physician Treatment Plan for Primary Diagnosis: Suicidal ideation Long Term Goal(s): Improvement in symptoms so as ready for discharge   Short Term Goals: Ability to disclose and discuss suicidal ideas Ability  to verbalize feelings will improve  Medication Management: Evaluate patient's response, side effects, and tolerance of medication regimen.  Therapeutic Interventions: 1 to 1 sessions, Unit Group sessions and Medication administration.  Evaluation of Outcomes: Not Met  Physician Treatment Plan for Secondary Diagnosis: Principal Problem:   Suicidal ideation  Long Term Goal(s): Improvement in symptoms so as ready for discharge   Short Term Goals: Ability to disclose and discuss suicidal ideas Ability to verbalize feelings will improve     Medication Management: Evaluate patient's response, side effects, and tolerance of medication regimen.  Therapeutic Interventions: 1 to 1 sessions, Unit Group sessions and Medication administration.  Evaluation of Outcomes: Not Met   RN Treatment Plan for Primary Diagnosis: Suicidal ideation Long Term Goal(s): Knowledge of disease and therapeutic regimen to maintain health will improve  Short Term Goals: Ability to verbalize frustration and anger appropriately will improve, Ability to demonstrate self-control, Ability to participate in decision making will improve, Ability to verbalize feelings will improve, Ability to disclose and discuss suicidal ideas, Ability to identify and develop effective coping behaviors will improve, and Compliance with prescribed medications will improve  Medication Management: RN will administer medications as ordered by provider, will assess and evaluate patient's response and provide education to patient for prescribed medication. RN will report any adverse and/or side effects to prescribing provider.  Therapeutic Interventions: 1 on 1 counseling sessions, Psychoeducation, Medication administration, Evaluate responses to treatment, Monitor vital signs  and CBGs as ordered, Perform/monitor CIWA, COWS, AIMS and Fall Risk screenings as ordered, Perform wound care treatments as ordered.  Evaluation of Outcomes: Not  Met   LCSW Treatment Plan for Primary Diagnosis: Suicidal ideation Long Term Goal(s): Safe transition to appropriate next level of care at discharge, Engage patient in therapeutic group addressing interpersonal concerns.  Short Term Goals: Engage patient in aftercare planning with referrals and resources, Increase social support, Increase ability to appropriately verbalize feelings, Increase emotional regulation, Facilitate acceptance of mental health diagnosis and concerns, and Increase skills for wellness and recovery  Therapeutic Interventions: Assess for all discharge needs, 1 to 1 time with Social worker, Explore available resources and support systems, Assess for adequacy in community support network, Educate family and significant other(s) on suicide prevention, Complete Psychosocial Assessment, Interpersonal group therapy.  Evaluation of Outcomes: Not Met   Progress in Treatment: Attending groups: No. Participating in groups: No. Taking medication as prescribed: Yes. Toleration medication: Yes. Family/Significant other contact made: No, will contact:  once permission is given. Patient understands diagnosis: No. Discussing patient identified problems/goals with staff: No. Medical problems stabilized or resolved: Yes. Denies suicidal/homicidal ideation: Yes. Issues/concerns per patient self-inventory: No. Other: none  New problem(s) identified: No, Describe:  none  New Short Term/Long Term Goal(s): detox, medication management for mood stabilization; elimination of SI thoughts; development of comprehensive mental wellness/sobriety plan.   Patient Goals:  Patient given the opportunity to attend treatment team, however, declined.   Discharge Plan or Barriers: CSW to assist with the development of appropriate discharge plans.   Reason for Continuation of Hospitalization: Anxiety Depression Medication stabilization Suicidal ideation Withdrawal symptoms  Estimated Length of  Stay:  1-7 days  Last 3 [place] Suicide Severity Risk Score: Flowsheet Row Admission (Current) from 06/16/2022 in [facility] from 06/15/2022 in [facility] [facility] from 06/14/2022 in [facility] CATEGORY High Risk High Risk No Risk       Last PHQ 2/9 Scores:     No data to display          Scribe for Treatment Team: SHERRON MUMMERT, Marlinda Mike 06/17/2022 11:31 AM

## 2022-06-17 NOTE — Group Note (Signed)
[facility] LCSW Group Therapy Note   Group Date: 06/17/2022 Start Time: 1300 End Time: 1400   Type of Therapy/Topic:  Group Therapy:  Emotion Regulation  Participation Level:  Did Not Attend    Description of Group:    The purpose of this group is to assist patients in learning to regulate negative emotions and experience positive emotions. Patients will be guided to discuss ways in which they have been vulnerable to their negative emotions. These vulnerabilities will be juxtaposed with experiences of positive emotions or situations, and patients challenged to use positive emotions to combat negative ones. Special emphasis will be placed on coping with negative emotions in conflict situations, and patients will process healthy conflict resolution skills.  Therapeutic Goals: Patient will identify two positive emotions or experiences to reflect on in order to balance out negative emotions:  Patient will label two or more emotions that they find the most difficult to experience:  Patient will be able to demonstrate positive conflict resolution skills through discussion or role plays:   Summary of Patient Progress: Patient declined to attend group despite invitation.     Therapeutic Modalities:   Cognitive Behavioral Therapy Feelings Identification Dialectical Behavioral Therapy   Eaven Schwager R Eirene Rather, LCSW 

## 2022-06-17 NOTE — Progress Notes (Signed)
[facility] MD Progress Note  06/17/2022 11:19 AM Cody Young.  MRN:  [id number] Subjective:   Patient slept well last night.  Tolerating his medications well, though he does note some mild sedation this morning.  Denies thoughts of suicide.  Depression remains ongoing and severe.  Looks forward to his mother visiting him today.  No particular stressors.  No new medical problems.  Appetite is intact.  Denies symptoms of drug/alcohol withdrawal.  Principal Problem: Suicidal ideation Diagnosis: Principal Problem:   Suicidal ideation  Total Time spent with patient: 30 minutes  Past Psychiatric History: The patient's second psychiatric authorization, he was admitted when he was 31 years old. He had 1 suicide attempt at that time. No psychiatric services at this time. Notes that he will also use cannabis and delta 8. He will drink 2-3 beers per day as well as a pint a span of a week.   Past Medical History: History reviewed. No pertinent past medical history. History reviewed. No pertinent surgical history. Family History: History reviewed. No pertinent family history.  Social History:  Social History   Substance and Sexual Activity  Alcohol Use Yes     Social History   Substance and Sexual Activity  Drug Use Yes   Types: Marijuana    Social History   Socioeconomic History   Marital status: Single    Spouse name: Not on file   Number of children: Not on file   Years of education: Not on file   Highest education level: Not on file  Occupational History   Not on file  Tobacco Use   Smoking status: Every Day    Packs/day: 1.00    Types: Cigarettes   Smokeless tobacco: Never  Substance and Sexual Activity   Alcohol use: Yes   Drug use: Yes    Types: Marijuana   Sexual activity: Not on file  Other Topics Concern   Not on file  Social History Narrative   Not on file   Social Determinants of Health   Financial Resource Strain: Not on file  Food Insecurity: Unknown  (06/16/2022)   Hunger Vital Sign    Worried About Running Out of Food in the Last Year: Patient refused    Ran Out of Food in the Last Year: Patient refused  Transportation Needs: Unknown (06/16/2022)   PRAPARE - Administrator, Civil Service (Medical): Patient refused    Lack of Transportation (Non-Medical): Patient refused  Physical Activity: Not on file  Stress: Not on file  Social Connections: Not on file   Additional Social History:                      Current Medications: Current Facility-Administered Medications  Medication Dose Route Frequency Provider Last Rate Last Admin   acetaminophen (TYLENOL) tablet 650 mg  650 mg Oral Q6H PRN Gabriel Cirri F, NP   650 mg at 06/17/22 0755   alum & mag hydroxide-simeth (MAALOX/MYLANTA) 200-200-20 MG/5ML suspension 30 mL  30 mL Oral Q4H PRN Gabriel Cirri F, NP       ARIPiprazole (ABILIFY) tablet 10 mg  10 mg Oral Daily Reggie Pile, MD   10 mg at 06/17/22 0755   clonazePAM (KLONOPIN) disintegrating tablet 1 mg  1 mg Oral QHS Reggie Pile, MD   1 mg at 06/16/22 2143   divalproex (DEPAKOTE) DR tablet 500 mg  500 mg Oral BID Reggie Pile, MD   500 mg at 06/17/22 [phone]  hydrOXYzine (ATARAX) tablet 25 mg  25 mg Oral TID PRN Vanetta Mulders, NP       loperamide (IMODIUM) capsule 2 mg  2 mg Oral PRN Reggie Pile, MD       magnesium hydroxide (MILK OF MAGNESIA) suspension 30 mL  30 mL Oral Daily PRN Vanetta Mulders, NP        Lab Results:  Results for orders placed or performed during the hospital encounter of 06/15/22 (from the past 48 hour(s))  Resp panel by RT-PCR (RSV, Flu A&B, Covid) Anterior Nasal Swab     Status: None   Collection Time: 06/16/22  9:55 AM   Specimen: Anterior Nasal Swab  Result Value Ref Range   SARS Coronavirus 2 by RT PCR NEGATIVE NEGATIVE    Comment: (NOTE) SARS-CoV-2 target nucleic acids are NOT DETECTED.  The SARS-CoV-2 RNA is generally detectable in upper respiratory specimens  during the acute phase of infection. The lowest concentration of SARS-CoV-2 viral copies this assay can detect is 138 copies/mL. A negative result does not preclude SARS-Cov-2 infection and should not be used as the sole basis for treatment or other patient management decisions. A negative result may occur with  improper specimen collection/handling, submission of specimen other than nasopharyngeal swab, presence of viral mutation(s) within the areas targeted by this assay, and inadequate number of viral copies(<138 copies/mL). A negative result must be combined with clinical observations, patient history, and epidemiological information. The expected result is Negative.  Fact Sheet for Patients:  BloggerCourse.com  Fact Sheet for Healthcare Providers:  SeriousBroker.it  This test is no t yet approved or cleared by the [place] FDA and  has been authorized for detection and/or diagnosis of SARS-CoV-2 by FDA under an Emergency Use Authorization (EUA). This EUA will remain  in effect (meaning this test can be used) for the duration of the COVID-19 declaration under Section 564(b)(1) of the Act, 21 U.S.C.section 360bbb-3(b)(1), unless the authorization is terminated  or revoked sooner.       Influenza A by PCR NEGATIVE NEGATIVE   Influenza B by PCR NEGATIVE NEGATIVE    Comment: (NOTE) The Xpert Xpress SARS-CoV-2/FLU/RSV plus assay is intended as an aid in the diagnosis of influenza from Nasopharyngeal swab specimens and should not be used as a sole basis for treatment. Nasal washings and aspirates are unacceptable for Xpert Xpress SARS-CoV-2/FLU/RSV testing.  Fact Sheet for Patients: BloggerCourse.com  Fact Sheet for Healthcare Providers: SeriousBroker.it  This test is not yet approved or cleared by the [place] FDA and has been authorized for detection and/or diagnosis  of SARS-CoV-2 by FDA under an Emergency Use Authorization (EUA). This EUA will remain in effect (meaning this test can be used) for the duration of the COVID-19 declaration under Section 564(b)(1) of the Act, 21 U.S.C. section 360bbb-3(b)(1), unless the authorization is terminated or revoked.     Resp Syncytial Virus by PCR NEGATIVE NEGATIVE    Comment: (NOTE) Fact Sheet for Patients: BloggerCourse.com  Fact Sheet for Healthcare Providers: SeriousBroker.it  This test is not yet approved or cleared by the [place] FDA and has been authorized for detection and/or diagnosis of SARS-CoV-2 by FDA under an Emergency Use Authorization (EUA). This EUA will remain in effect (meaning this test can be used) for the duration of the COVID-19 declaration under Section 564(b)(1) of the Act, 21 U.S.C. section 360bbb-3(b)(1), unless the authorization is terminated or revoked.  Performed at [facility], [address], [place], [place] [address]  Blood Alcohol level:  Lab Results  Component Value Date   ETH <10 06/15/2022    Metabolic Disorder Labs: No results found for: "HGBA1C", "MPG" No results found for: "PROLACTIN" No results found for: "CHOL", "TRIG", "HDL", "CHOLHDL", "VLDL", "LDLCALC"  Physical Findings: AIMS: Facial and Oral Movements Muscles of Facial Expression: None, normal Lips and Perioral Area: None, normal Jaw: None, normal Tongue: None, normal,Extremity Movements Upper (arms, wrists, hands, fingers): None, normal Lower (legs, knees, ankles, toes): None, normal, Trunk Movements Neck, shoulders, hips: None, normal, Overall Severity Severity of abnormal movements (highest score from questions above): None, normal Incapacitation due to abnormal movements: None, normal Patient's awareness of abnormal movements (rate only patient's report): No Awareness, Dental Status Current problems with teeth and/or  dentures?: No Does patient usually wear dentures?: No  CIWA:    COWS:       Musculoskeletal: Strength & Muscle Tone: within normal limits Gait & Station: normal Patient leans: Left                       Psychiatric Specialty Exam:   Presentation  General Appearance:  Casual   Eye Contact: Good   Speech: Clear and Coherent   Speech Volume: Normal   Handedness: Right     Mood and Affect  Mood: better   Affect: neutral     Thought Process  Thought Processes: Coherent   Duration of Psychotic Symptoms:N/A Past Diagnosis of Schizophrenia or Psychoactive disorder: No   Descriptions of Associations:Intact   Orientation:Full (Time, Place and Person)   Thought Content:WDL   Hallucinations:Hallucinations: None   Ideas of Reference:None   Suicidal Thoughts:Suicidal Thoughts: Yes, Passive SI Passive Intent and/or Plan: With Plan   Homicidal Thoughts:Homicidal Thoughts: No     Sensorium  Memory: Immediate Fair   Judgment: Fair   Insight: Fair     Art therapist  Concentration: Fair   Attention Span: Good   Recall: [employer] of Knowledge: Fair   Language: Fair    Psychomotor Activity: Psychomotor Activity: Normal    Assets: Housing; Physical Health; Resilience; Social Support     Sleep  Sleep: Sleep: good      Physical Exam: Physical Exam ROS Blood pressure 136/85, pulse 87, temperature 97.7 F (36.5 C), temperature source Oral, resp. rate 20, height 6\' 2"  (1.88 m), weight 109.3 kg, SpO2 98 %. Body mass index is 30.94 kg/m.   Treatment Plan Summary: Daily contact with patient to assess and evaluate symptoms and progress in treatment and Medication management  12/26 Start Abilify 10 mg p.o. daily Start Depakote 500 mg p.o. twice daily Start Klonopin 1 mg p.o. nightly while he is admitted to the hospital. Risk-benefit side effects and alternatives on all medications.  Abilify on Reviewed metabolic  disturbance sedation and weight gain EPS and MS Monitor patient sleep Establish and maintain the therapy alliance with patient on Right patient with structure and support Consider naltrexone as an option for patient Patient interested in chemical dependency treatment And collateral information   12/27 Have patient signed in voluntarily. He seeks help.  Obtain lipid panel and hemoglobin A1c tomorrow AM    1/28, MD 06/17/2022, 11:19 AM

## 2022-06-17 NOTE — Progress Notes (Signed)
This Clinical research associate was notified by LCSW that patient wants to sign a 72-hour request for discharge. This writer went down to witness patient fill out the form. 72-hour form was placed into patient's chart.

## 2022-06-18 ENCOUNTER — Other Ambulatory Visit: Payer: Self-pay

## 2022-06-18 DIAGNOSIS — R45851 Suicidal ideations: Secondary | ICD-10-CM | POA: Diagnosis not present

## 2022-06-18 DIAGNOSIS — U071 COVID-19: Secondary | ICD-10-CM | POA: Insufficient documentation

## 2022-06-18 LAB — RESP PANEL BY RT-PCR (RSV, FLU A&B, COVID)  RVPGX2
Influenza A by PCR: NEGATIVE
Influenza B by PCR: NEGATIVE
Resp Syncytial Virus by PCR: NEGATIVE
SARS Coronavirus 2 by RT PCR: POSITIVE — AB

## 2022-06-18 MED ORDER — NICOTINE 14 MG/24HR TD PT24: 14.0000 mg | MEDICATED_PATCH | Freq: Every day | TRANSDERMAL | 1 refills | Status: DC

## 2022-06-18 MED ORDER — ARIPIPRAZOLE 10 MG PO TABS
10.0000 mg | ORAL_TABLET | Freq: Every day | ORAL | 0 refills | Status: DC
  Filled 2022-06-18: qty 30, 30d supply, fill #0

## 2022-06-18 MED ORDER — CLONAZEPAM 1 MG PO TBDP: 1.0000 mg | ORAL_TABLET | Freq: Every day | ORAL | 1 refills | Status: DC

## 2022-06-18 MED ORDER — NICOTINE 14 MG/24HR TD PT24
14.0000 mg | MEDICATED_PATCH | Freq: Every day | TRANSDERMAL | Status: DC
  Administered 2022-06-18: 14 mg via TRANSDERMAL
  Filled 2022-06-18: qty 1

## 2022-06-18 MED ORDER — DIVALPROEX SODIUM 500 MG PO DR TAB
500.0000 mg | DELAYED_RELEASE_TABLET | Freq: Two times a day (BID) | ORAL | 0 refills | Status: DC
  Filled 2022-06-18: qty 60, 30d supply, fill #0

## 2022-06-18 MED ORDER — ARIPIPRAZOLE ER 400 MG IM SRER
400.0000 mg | INTRAMUSCULAR | Status: DC
  Administered 2022-06-18: 400 mg via INTRAMUSCULAR
  Filled 2022-06-18: qty 2

## 2022-06-18 MED ORDER — ARIPIPRAZOLE ER 400 MG IM SRER: 400.0000 mg | INTRAMUSCULAR | 1 refills | Status: DC

## 2022-06-18 MED ORDER — DIVALPROEX SODIUM 500 MG PO DR TAB: 500.0000 mg | DELAYED_RELEASE_TABLET | Freq: Two times a day (BID) | ORAL | 1 refills | Status: DC

## 2022-06-18 MED ORDER — NICOTINE 14 MG/24HR TD PT24
14.0000 mg | MEDICATED_PATCH | Freq: Every day | TRANSDERMAL | 0 refills | Status: DC
  Filled 2022-06-18: qty 14, 14d supply, fill #0

## 2022-06-18 MED ORDER — ARIPIPRAZOLE 10 MG PO TABS: 10.0000 mg | ORAL_TABLET | Freq: Every day | ORAL | 1 refills | Status: DC

## 2022-06-18 NOTE — Progress Notes (Signed)
  [facility] Adult Case Management Discharge Plan :  Will you be returning to the same living situation after discharge:  No. At discharge, do you have transportation home?: Yes,  CSW provided with Link bus ticket. Do you have the ability to pay for your medications: No.  Release of information consent forms completed and in the chart;  Patient's signature needed at discharge.  Patient to Follow up at:  Follow-up Information     [facility] Follow up.   Why: Your appointment is scheduled for Wednesday, 06/24/22 at 7:15am. You will be meeting Lorella Nimrod, peer support specialist at [facility]. Thanks! Contact information: [address] [place] [address] [phone]                 Next level of care provider has access to [facility] Link:no  Safety Planning and Suicide Prevention discussed: Yes,  SPE completed with pt.     Has patient been referred to the Quitline?: Patient refused referral  Patient has been referred for addiction treatment: Pt. refused referral  Glenis Smoker, LCSW 06/18/2022, 1:40 PM

## 2022-06-18 NOTE — Discharge Summary (Signed)
Addendum to discharge summary: Treatment team reports that mother had concerns feeling like he still might be dangerous to himself.  Spoke with the patient about this and he absolutely denied making any comments to his mother last night about suicide.  He repeated to me that he has no thought of harming himself whatsoever and denied any current psychotic symptoms.  He stated that he was agreeable to restarting the long-acting Abilify injection prior to discharge.  We will give him the Abilify shot today before he leaves and I have provided a prescription for that as well.  He continues to agree to follow-up at [facility].  Not under IVC and does not meet active commitment criteria.  Proceeding with discharge plan.

## 2022-06-18 NOTE — Progress Notes (Signed)
Recreation Therapy Notes   Date: 06/18/2022  Time: 10:00 am  Location: Craft room     Behavioral response: N/A   Intervention Topic: Coping Skills   Discussion/Intervention: Patient refused to attend group.   Clinical Observations/Feedback:  Patient refused to attend group.    Nehemiah Montee LRT/CTRS        Carmita Boom 06/18/2022 11:21 AM 

## 2022-06-18 NOTE — Progress Notes (Signed)
   06/17/22 2300  Psych Admission Type (Psych Patients Only)  Admission Status Voluntary  Psychosocial Assessment  Patient Complaints Anxiety  Eye Contact Fair  Facial Expression Anxious;Sad  Affect Anxious;Depressed  Speech Logical/coherent  Interaction Isolative  Motor Activity Other (Comment)  Appearance/Hygiene In scrubs;Unremarkable  Behavior Characteristics Appropriate to situation;Cooperative  Mood Anxious;Depressed  Thought Process  Coherency WDL  Content WDL  Delusions None reported or observed  Perception WDL  Hallucination None reported or observed  Judgment Impaired  Confusion None  Danger to Self  Current suicidal ideation?  (Denies)  Agreement Not to Harm Self Yes  Description of Agreement verbal  Danger to Others  Danger to Others None reported or observed

## 2022-06-18 NOTE — BHH Counselor (Signed)
CSW spoke with the patient's mother, Alexio Sroka, [phone].    CSW updated on patient's scheduled discharge today 06/18/2022.    Mother expressed concerns. Mother reports that patient should NOT be discharged.  She reports that patient is unwell and continues to be "paranoid".  She reports that patient made comments during visitation on 06/17/2022 that suggested that he remains suicidal.    She reports that patient needs a long-term stay.  She requested that patient remain hospitalized until she returns to [place] in one month.  CSW explained that hospitalization is an acute stay for stabilization and no guarantee of a 30 day stay.  CSW asked about guardianship. Mother expressed that she was patient's payee, however is NOT guardian.  Mother has concerns that this is not a safe discharge stating that patient is a danger to self or others.  CSW has provided psychiatrist with the above information and provided mother's contact info as mother is requesting a return call.  Penni Homans, MSW, LCSW 06/18/2022 12:04 PM

## 2022-06-18 NOTE — BHH Suicide Risk Assessment (Signed)
[facility] Discharge Suicide Risk Assessment   Principal Problem: Suicidal ideation Discharge Diagnoses: Principal Problem:   Suicidal ideation Active Problems:   Lab test positive for detection of COVID-19 virus   Total Time spent with patient: 30 minutes  Musculoskeletal: Strength & Muscle Tone: within normal limits Gait & Station: normal Patient leans: N/A  Psychiatric Specialty Exam  Presentation  General Appearance:  Casual  Eye Contact: Good  Speech: Clear and Coherent  Speech Volume: Normal  Handedness: Right   Mood and Affect  Mood: Depressed  Duration of Depression Symptoms: Greater than two weeks  Affect: Congruent; Blunt   Thought Process  Thought Processes: Coherent  Descriptions of Associations:Intact  Orientation:Full (Time, Place and Person)  Thought Content:WDL  History of Schizophrenia/Schizoaffective disorder:No  Duration of Psychotic Symptoms:No data recorded Hallucinations:No data recorded Ideas of Reference:None  Suicidal Thoughts:No data recorded Homicidal Thoughts:No data recorded  Sensorium  Memory: Immediate Fair  Judgment: Fair  Insight: Fair   Art therapist  Concentration: Fair  Attention Span: Good  Recall: [employer] of Knowledge: Fair  Language: Fair   Psychomotor Activity  Psychomotor Activity:No data recorded  Assets  Assets: Housing; Physical Health; Resilience; Social Support   Sleep  Sleep:No data recorded  Physical Exam: Physical Exam Vitals and nursing note reviewed.  Constitutional:      Appearance: Normal appearance.  HENT:     Head: Normocephalic and atraumatic.     Mouth/Throat:     Pharynx: Oropharynx is clear.  Eyes:     Pupils: Pupils are equal, round, and reactive to light.  Cardiovascular:     Rate and Rhythm: Normal rate and regular rhythm.  Pulmonary:     Effort: Pulmonary effort is normal.     Breath sounds: Normal breath sounds.  Abdominal:      General: Abdomen is flat.     Palpations: Abdomen is soft.  Musculoskeletal:        General: Normal range of motion.  Skin:    General: Skin is warm and dry.  Neurological:     General: No focal deficit present.     Mental Status: He is alert. Mental status is at baseline.  Psychiatric:        Attention and Perception: Attention normal.        Mood and Affect: Mood normal.        Speech: Speech normal.        Behavior: Behavior normal.        Thought Content: Thought content normal.        Cognition and Memory: Cognition normal.        Judgment: Judgment normal.    Review of Systems  Constitutional: Negative.   HENT:  Positive for sore throat.   Eyes: Negative.   Respiratory: Negative.    Cardiovascular: Negative.   Gastrointestinal: Negative.   Musculoskeletal: Negative.   Skin: Negative.   Neurological: Negative.   Psychiatric/Behavioral: Negative.     Blood pressure (!) 135/99, pulse 89, temperature 97.8 F (36.6 C), temperature source Oral, resp. rate 20, height 6\' 2"  (1.88 m), weight 109.3 kg, SpO2 100 %. Body mass index is 30.94 kg/m.  Mental Status Per Nursing Assessment::   On Admission:  NA  Demographic Factors:  Male, Caucasian, and Living alone  Loss Factors: Decline in physical health  Historical Factors: Impulsivity  Risk Reduction Factors:   Positive therapeutic relationship  Continued Clinical Symptoms:  Depression:   Impulsivity Alcohol/Substance Abuse/Dependencies  Cognitive Features That Contribute To Risk:  None    Suicide Risk:  Minimal: No identifiable suicidal ideation.  Patients presenting with no risk factors but with morbid ruminations; may be classified as minimal risk based on the severity of the depressive symptoms    Plan Of Care/Follow-up recommendations:  Other:  Patient denies all suicidal ideation and reports mood is feeling better.  He is agreeable to plan for continued medication.  Patient will be discharged today with  recommended follow-up at [facility].  Does not appear to be at acute risk to himself or to meet commitment criteria.  Patient was found this morning to be positive for COVID and so has been advised that he needs to wait until he is symptom free for 5 days before following up with outpatient treatment.  Mordecai Rasmussen, MD 06/18/2022, 10:58 AM

## 2022-06-18 NOTE — Progress Notes (Signed)
Discharge note: SSP completed. RN met with pt and reviewed pt's discharge instructions. Pt verbalized understanding of discharge instructions and pt did not have any questions. RN reviewed and provided pt with a copy of SRA, AVS and Transition Record. RN returned pt's belongings to pt. Prescriptions and samples were given to pt. Pt denied SI/HI/AVH and voiced no concerns. Pt was appreciative of the care pt received at [facility]. Patient discharged to their ride without incident.  06/18/22 0759  Psych Admission Type (Psych Patients Only)  Admission Status Voluntary  Psychosocial Assessment  Patient Complaints Anxiety;Depression  Eye Contact Fair  Facial Expression Sad  Affect Appropriate to circumstance  Speech Logical/coherent  Interaction Isolative;Superficial;Evasive  Motor Activity Slow  Appearance/Hygiene In scrubs;Unremarkable  Behavior Characteristics Cooperative;Appropriate to situation;Calm  Mood Anxious;Depressed  Aggressive Behavior  Effect No apparent injury  Thought Process  Coherency WDL  Content WDL  Delusions None reported or observed  Perception WDL  Hallucination None reported or observed  Judgment Impaired  Confusion None  Danger to Self  Current suicidal ideation? Denies  Danger to Others  Danger to Others None reported or observed

## 2022-06-18 NOTE — Plan of Care (Signed)
  Problem: Clinical Measurements: Goal: Will remain free from infection Outcome: Not Progressing Goal: Respiratory complications will improve Outcome: Not Progressing   Problem: Pain Managment: Goal: General experience of comfort will improve Outcome: Not Progressing

## 2022-06-18 NOTE — Discharge Summary (Signed)
Physician Discharge Summary Note  Patient:  Cody Young is an 31 y.o., male MRN:  [id number] DOB:  23-Sep-1990 Patient phone:  [phone] (home)  Patient address:   [address] [place] [address],  Total Time spent with patient: 30 minutes  Date of Admission:  06/16/2022 Date of Discharge: 06/18/2022  Reason for Admission: Patient was admitted after presenting to the hospital stating he was having depression and had had some suicidal thoughts.  Had not acted on them.  Had been abusing substances in a relapse recently.  Principal Problem: Suicidal ideation Discharge Diagnoses: Principal Problem:   Suicidal ideation Active Problems:   Lab test positive for detection of COVID-19 virus   Past Psychiatric History: History of chronic recurrent depression.  1 previous hospitalization that we know of.  1 previous suicide attempt years ago.  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  Diagnosis of bipolar disorder unclear if he actually has had any manic episodes.  Past Medical History: History reviewed. No pertinent past medical history. History reviewed. No pertinent surgical history. Family History: History reviewed. No pertinent family history. Family Psychiatric  History: None reported Social History:  Social History   Substance and Sexual Activity  Alcohol Use Yes     Social History   Substance and Sexual Activity  Drug Use Yes   Types: Marijuana    Social History   Socioeconomic History   Marital status: Single    Spouse name: Not on file   Number of children: Not on file   Years of education: Not on file   Highest education level: Not on file  Occupational History   Not on file  Tobacco Use   Smoking status: Every Day    Packs/day: 1.00    Types: Cigarettes   Smokeless tobacco: Never  Substance and Sexual Activity   Alcohol use: Yes   Drug use: Yes    Types: Marijuana   Sexual activity: Not on file  Other Topics Concern   Not on file   Social History Narrative   Not on file   Social Determinants of Health   Financial Resource Strain: Not on file  Food Insecurity: Unknown (06/16/2022)   Hunger Vital Sign    Worried About Running Out of Food in the Last Year: Patient refused    Ran Out of Food in the Last Year: Patient refused  Transportation Needs: Unknown (06/16/2022)   PRAPARE - Administrator, Civil Service (Medical): Patient refused    Lack of Transportation (Non-Medical): Patient refused  Physical Activity: Not on file  Stress: Not on file  Social Connections: Not on file    Hospital Course: Patient did not display any dangerous aggressive violent or suicidal behavior.  He was cooperative with restarting medication.  He was restarted on Abilify Depakote and clonazepam.  Tolerated medicine well.  Day of discharge patient reported a sore throat and feeling a little bit of mild malaise.  Vital signs unremarkable.  COVID test performed came back as positive.  Patient is being discharged today with information provided that he is COVID-positive and should isolate until he is symptom free and for 5 days after that.  He has spoken with the representative from [facility] and will be following up there for mental health treatment.  Prescriptions provided for medicine as well as supply of the noncontrolled substance medicines.  Physical Findings: AIMS: Facial and Oral Movements Muscles of Facial Expression: None, normal Lips and Perioral Area: None, normal Jaw: None,  normal Tongue: None, normal,Extremity Movements Upper (arms, wrists, hands, fingers): None, normal Lower (legs, knees, ankles, toes): None, normal, Trunk Movements Neck, shoulders, hips: None, normal, Overall Severity Severity of abnormal movements (highest score from questions above): None, normal Incapacitation due to abnormal movements: None, normal Patient's awareness of abnormal movements (rate only patient's report): No Awareness, Dental  Status Current problems with teeth and/or dentures?: No Does patient usually wear dentures?: No  CIWA:    COWS:     Musculoskeletal: Strength & Muscle Tone: within normal limits Gait & Station: normal Patient leans: N/A   Psychiatric Specialty Exam:  Presentation  General Appearance:  Casual  Eye Contact: Good  Speech: Clear and Coherent  Speech Volume: Normal  Handedness: Right   Mood and Affect  Mood: Depressed  Affect: Congruent; Blunt   Thought Process  Thought Processes: Coherent  Descriptions of Associations:Intact  Orientation:Full (Time, Place and Person)  Thought Content:WDL  History of Schizophrenia/Schizoaffective disorder:No  Duration of Psychotic Symptoms:No data recorded Hallucinations:No data recorded Ideas of Reference:None  Suicidal Thoughts:No data recorded Homicidal Thoughts:No data recorded  Sensorium  Memory: Immediate Fair  Judgment: Fair  Insight: Fair   Art therapist  Concentration: Fair  Attention Span: Good  Recall: [employer] of Knowledge: Fair  Language: Fair   Psychomotor Activity  Psychomotor Activity:No data recorded  Assets  Assets: Housing; Physical Health; Resilience; Social Support   Sleep  Sleep:No data recorded   Physical Exam: Physical Exam Vitals and nursing note reviewed.  Constitutional:      Appearance: Normal appearance.  HENT:     Head: Normocephalic and atraumatic.     Mouth/Throat:     Pharynx: Oropharynx is clear.  Eyes:     Pupils: Pupils are equal, round, and reactive to light.  Cardiovascular:     Rate and Rhythm: Normal rate and regular rhythm.  Pulmonary:     Effort: Pulmonary effort is normal.     Breath sounds: Normal breath sounds.  Abdominal:     General: Abdomen is flat.     Palpations: Abdomen is soft.  Musculoskeletal:        General: Normal range of motion.  Skin:    General: Skin is warm and dry.  Neurological:     General: No  focal deficit present.     Mental Status: He is alert. Mental status is at baseline.  Psychiatric:        Attention and Perception: Attention normal.        Mood and Affect: Mood normal.        Speech: Speech normal.        Behavior: Behavior normal.        Thought Content: Thought content normal.        Cognition and Memory: Cognition normal.        Judgment: Judgment normal.    Review of Systems  Constitutional: Negative.   HENT:  Positive for sore throat.   Eyes: Negative.   Respiratory: Negative.    Cardiovascular: Negative.   Gastrointestinal: Negative.   Musculoskeletal: Negative.   Skin: Negative.   Neurological: Negative.   Psychiatric/Behavioral: Negative.     Blood pressure (!) 135/99, pulse 89, temperature 97.8 F (36.6 C), temperature source Oral, resp. rate 20, height 6\' 2"  (1.88 m), weight 109.3 kg, SpO2 100 %. Body mass index is 30.94 kg/m.   Social History   Tobacco Use  Smoking Status Every Day   Packs/day: 1.00   Types: Cigarettes  Smokeless Tobacco  Never   Tobacco Cessation:  A prescription for an FDA-approved tobacco cessation medication provided at discharge   Blood Alcohol level:  Lab Results  Component Value Date   ETH <10 06/15/2022    Metabolic Disorder Labs:  No results found for: "HGBA1C", "MPG" No results found for: "PROLACTIN" No results found for: "CHOL", "TRIG", "HDL", "CHOLHDL", "VLDL", "LDLCALC"  See Psychiatric Specialty Exam and Suicide Risk Assessment completed by Attending Physician prior to discharge.  Discharge destination:  Home  Is patient on multiple antipsychotic therapies at discharge:  No   Has Patient had three or more failed trials of antipsychotic monotherapy by history:  No  Recommended Plan for Multiple Antipsychotic Therapies: NA  Discharge Instructions     Diet - low sodium heart healthy   Complete by: As directed    Increase activity slowly   Complete by: As directed       Allergies as of  06/18/2022   No Known Allergies      Medication List     STOP taking these medications    ibuprofen 600 MG tablet Commonly known as: ADVIL       TAKE these medications      Indication  ARIPiprazole 10 MG tablet Commonly known as: ABILIFY Take 1 tablet (10 mg total) by mouth daily. Start taking on: June 19, 2022  Indication: Major Depressive Disorder   clonazePAM 1 MG disintegrating tablet Commonly known as: KLONOPIN Take 1 tablet (1 mg total) by mouth at bedtime.  Indication: Feeling Anxious   divalproex 500 MG DR tablet Commonly known as: DEPAKOTE Take 1 tablet (500 mg total) by mouth 2 (two) times daily.  Indication: Depressive Phase of Manic-Depression   nicotine 14 mg/24hr patch Commonly known as: NICODERM CQ - dosed in mg/24 hours Place 1 patch (14 mg total) onto the skin daily. Start taking on: June 19, 2022  Indication: Nicotine Addiction         Follow-up recommendations:  Other:  Patient denies any suicidal ideation.  Behavior has been appropriate.  Agrees to outpatient follow-up with [facility].  Medicines provided at this time.  Comments: See above  Signed: Mordecai Rasmussen, MD 06/18/2022, 11:01 AM

## 2022-06-19 ENCOUNTER — Emergency Department
Admission: EM | Admit: 2022-06-19 | Discharge: 2022-06-19 | Disposition: A | Discharge status: Home / Self Care | Payer: Self-pay | Attending: Emergency Medicine | Admitting: Emergency Medicine

## 2022-06-19 ENCOUNTER — Emergency Department: Payer: Self-pay

## 2022-06-19 ENCOUNTER — Other Ambulatory Visit: Payer: Self-pay

## 2022-06-19 ENCOUNTER — Encounter: Payer: Self-pay | Admitting: Emergency Medicine

## 2022-06-19 DIAGNOSIS — M25561 Pain in right knee: Secondary | ICD-10-CM | POA: Insufficient documentation

## 2022-06-19 NOTE — ED Triage Notes (Signed)
Patient ambulatory to triage with steady gait, without difficulty or distress noted; pt reports rt knee pain since yesterday "from walking too much"; also st he received his bipolar inj yesterday and believes he is having an allergic rx from it--reports "dry mouth"

## 2022-06-19 NOTE — ED Provider Notes (Signed)
[facility] Provider Note    Event Date/Time   First MD Initiated Contact with Patient 06/19/22 0730     (approximate)   History   Knee Pain   HPI  Cody Young. is a 31 y.o. male with a past medical history of depression and bipolar disorder on Abilify who presents today for evaluation of knee pain.  Patient thinks that he has had pain from "walking too much."  He reports that his pain began yesterday.  He reports that he has been able to walk on it.  He denies paresthesias.  He denies specific injury.  Secondly, he is worried that he may have had an allergic reaction from his Invega injection.  He reports that he developed a dry mouth afterwards.  He denies any trouble breathing or swallowing.  He has not had any abdominal pain, nausea, vomiting, diarrhea.  He denies any tongue or lip swelling.  He has tolerated this medication many times in the past.  Patient Active Problem List   Diagnosis Date Noted   Lab test positive for detection of COVID-19 virus 06/18/2022   Suicidal ideation 06/16/2022   Verbalizes suicidal thoughts 06/15/2022   Depression 06/15/2022   Cocaine abuse (HCC) 06/15/2022          Physical Exam   Triage Vital Signs: ED Triage Vitals  Enc Vitals Group     BP 06/19/22 0519 (!) 157/98     Pulse Rate 06/19/22 0519 89     Resp 06/19/22 0519 18     Temp 06/19/22 0519 97.6 F (36.4 C)     Temp Source 06/19/22 0519 Oral     SpO2 06/19/22 0519 96 %     Weight 06/19/22 0509 240 lb (108.9 kg)     Height 06/19/22 0509 6\' 2"  (1.88 m)     Head Circumference --      Peak Flow --      Pain Score 06/19/22 0509 8     Pain Loc --      Pain Edu? --      Excl. in [place]? --     Most recent vital signs: Vitals:   06/19/22 0519  BP: (!) 157/98  Pulse: 89  Resp: 18  Temp: 97.6 F (36.4 C)  SpO2: 96%    Physical Exam Vitals and nursing note reviewed.  Constitutional:      General: Awake and alert. No acute distress.     Appearance: Normal appearance. The patient is normal weight.  HENT:     Head: Normocephalic and atraumatic.     Mouth: Mucous membranes are moist.  No tongue or lip swelling Eyes:     General: PERRL. Normal EOMs        Right eye: No discharge.        Left eye: No discharge.     Conjunctiva/sclera: Conjunctivae normal.  Cardiovascular:     Rate and Rhythm: Normal rate and regular rhythm.     Pulses: Normal pulses.  Pulmonary:     Effort: Pulmonary effort is normal. No respiratory distress.     Breath sounds: Normal breath sounds.  Abdominal:     Abdomen is soft. There is no abdominal tenderness. No rebound or guarding. No distention. Musculoskeletal:        General: No swelling. Normal range of motion.     Cervical back: Normal range of motion and neck supple. Right knee: No deformity or rash. No joint line tenderness. No patellar tenderness,  no ballotment Warm and well perfused extremity with 2+ pedal pulses 5/5 strength to dorsiflexion and plantarflexion at the ankle with intact sensation throughout extremity Normal range of motion of the knee, with intact flexion and extension to active and passive range of motion. Extensor mechanism intact. No ligamentous laxity. Negative anterior/posterior drawer/negative lachman, negative mcmurrays No effusion or warmth Intact quadriceps, hamstring function, patellar tendon function Pelvis stable Full ROM of ankle without pain or swelling Foot warm and well perfused   Skin:    General: Skin is warm and dry.     Capillary Refill: Capillary refill takes less than 2 seconds.     Findings: No rash.  Neurological:     Mental Status: The patient is awake and alert.      ED Results / Procedures / Treatments   Labs (all labs ordered are listed, but only abnormal results are displayed) Labs Reviewed - No data to display   EKG     RADIOLOGY I independently reviewed and interpreted imaging and agree with radiologists  findings.     PROCEDURES:  Critical Care performed:   Procedures   MEDICATIONS ORDERED IN ED: Medications - No data to display   IMPRESSION / MDM / ASSESSMENT AND PLAN / ED COURSE  I reviewed the triage vital signs and the nursing notes.   Differential diagnosis includes, but is not limited to, effusion, sprain, contusion, dislocation, fracture, joint infection, tendon rupture. No evidence of neurological deficit or vascular compromise on exam. No fracture/dislocation on X-Ray. No deformity or obvious ligamentous laxity on exam.No constitutional symptoms or effusion to suggest septic joint. No history of immunosuppression. Overall well appearing, vital signs stable. No indication for diagnostic or therapeutic procedure such as arthrocentesis.  As for his concern about allergic reaction, there is no tongue or lip swelling, no trouble breathing or swallowing, vital signs are stable patient has normal oxygen saturation on room air, not consistent with angioedema or anaphylaxis.  Reassurance was provided.  Return precautions and care instructions discussed. Outpatient follow-up advised. Patient agrees with plan of care.   Patient's presentation is most consistent with acute complicated illness / injury requiring diagnostic workup.      FINAL CLINICAL IMPRESSION(S) / ED DIAGNOSES   Final diagnoses:  Acute pain of right knee     Rx / DC Orders   ED Discharge Orders     None        Note:  This document was prepared using Dragon voice recognition software and may include unintentional dictation errors.   Keturah Shavers 06/19/22 1043    Jene Every, MD 06/19/22 [phone]

## 2022-06-19 NOTE — ED Notes (Signed)
Provider at bedside

## 2022-06-19 NOTE — ED Notes (Signed)
rt knee pain since yesterday "from walking too much";  fell landing on knee, increased pain on palpation . Distal pulses present

## 2022-06-19 NOTE — Discharge Instructions (Signed)
Your xray is normal. Please rest, ice, elevate your leg. Return for any new, worsening, or change in symptoms or other concerns.

## 2022-08-25 ENCOUNTER — Other Ambulatory Visit: Payer: Self-pay

## 2022-08-25 ENCOUNTER — Emergency Department
Admission: EM | Admit: 2022-08-25 | Discharge: 2022-08-26 | Disposition: A | Discharge status: Home / Self Care | Payer: No Typology Code available for payment source | Attending: Emergency Medicine | Admitting: Emergency Medicine

## 2022-08-25 DIAGNOSIS — F1590 Other stimulant use, unspecified, uncomplicated: Secondary | ICD-10-CM | POA: Diagnosis present

## 2022-08-25 DIAGNOSIS — Z20822 Contact with and (suspected) exposure to covid-19: Secondary | ICD-10-CM | POA: Diagnosis not present

## 2022-08-25 DIAGNOSIS — F1490 Cocaine use, unspecified, uncomplicated: Secondary | ICD-10-CM | POA: Diagnosis not present

## 2022-08-25 DIAGNOSIS — F319 Bipolar disorder, unspecified: Secondary | ICD-10-CM | POA: Diagnosis not present

## 2022-08-25 DIAGNOSIS — I10 Essential (primary) hypertension: Secondary | ICD-10-CM | POA: Insufficient documentation

## 2022-08-25 DIAGNOSIS — R45851 Suicidal ideations: Secondary | ICD-10-CM

## 2022-08-25 DIAGNOSIS — F17210 Nicotine dependence, cigarettes, uncomplicated: Secondary | ICD-10-CM | POA: Insufficient documentation

## 2022-08-25 DIAGNOSIS — F32A Depression, unspecified: Secondary | ICD-10-CM | POA: Diagnosis present

## 2022-08-25 DIAGNOSIS — F1410 Cocaine abuse, uncomplicated: Secondary | ICD-10-CM | POA: Insufficient documentation

## 2022-08-25 DIAGNOSIS — R197 Diarrhea, unspecified: Secondary | ICD-10-CM | POA: Diagnosis not present

## 2022-08-25 DIAGNOSIS — F1290 Cannabis use, unspecified, uncomplicated: Secondary | ICD-10-CM | POA: Diagnosis not present

## 2022-08-25 DIAGNOSIS — F314 Bipolar disorder, current episode depressed, severe, without psychotic features: Secondary | ICD-10-CM | POA: Diagnosis present

## 2022-08-25 LAB — SALICYLATE LEVEL: Salicylate Lvl: <7 mg/dL — ABNORMAL LOW (ref 7.0–30.0)

## 2022-08-25 LAB — COMPREHENSIVE METABOLIC PANEL
ALT: 14 U/L (ref 0–44)
AST: 14 U/L — ABNORMAL LOW (ref 15–41)
Albumin: 4.1 g/dL (ref 3.5–5.0)
Alkaline Phosphatase: 57 U/L (ref 38–126)
Anion gap: 7 (ref 5–15)
BUN: 12 mg/dL (ref 6–20)
CO2: 28 mmol/L (ref 22–32)
Calcium: 9.2 mg/dL (ref 8.9–10.3)
Chloride: 105 mmol/L (ref 98–111)
Creatinine, Ser: 1.06 mg/dL (ref 0.61–1.24)
GFR, Estimated: >60 mL/min (ref >60)
Glucose, Bld: 109 mg/dL — ABNORMAL HIGH (ref 70–99)
Potassium: 4.1 mmol/L (ref 3.5–5.1)
Sodium: 140 mmol/L (ref 135–145)
Total Bilirubin: 0.9 mg/dL (ref 0.3–1.2)
Total Protein: 7 g/dL (ref 6.5–8.1)

## 2022-08-25 LAB — CBC
HCT: 51.7 % (ref 39.0–52.0)
Hemoglobin: 17.1 g/dL — ABNORMAL HIGH (ref 13.0–17.0)
MCH: 31.3 pg (ref 26.0–34.0)
MCHC: 33.1 g/dL (ref 30.0–36.0)
MCV: 94.5 fL (ref 80.0–100.0)
Platelets: 176 10*3/uL (ref 150–400)
RBC: 5.47 MIL/uL (ref 4.22–5.81)
RDW: 12.8 % (ref 11.5–15.5)
WBC: 6.1 10*3/uL (ref 4.0–10.5)
nRBC: 0 % (ref 0.0–0.2)

## 2022-08-25 LAB — ETHANOL: Alcohol, Ethyl (B): <10 mg/dL (ref <10)

## 2022-08-25 LAB — ACETAMINOPHEN LEVEL: Acetaminophen (Tylenol), Serum: <10 ug/mL — ABNORMAL LOW (ref 10–30)

## 2022-08-25 MED ORDER — LOPERAMIDE HCL 2 MG PO TABS
2.0000 mg | ORAL_TABLET | Freq: Four times a day (QID) | ORAL | 0 refills | Status: DC | PRN
  Filled 2022-08-25: qty 30, 8d supply, fill #0

## 2022-08-25 MED ORDER — ONDANSETRON 4 MG PO TBDP: 4.0000 mg | ORAL_TABLET | Freq: Three times a day (TID) | ORAL | Status: DC | PRN

## 2022-08-25 MED ORDER — ONDANSETRON HCL 4 MG PO TABS
4.0000 mg | ORAL_TABLET | Freq: Every day | ORAL | 1 refills | Status: DC | PRN
  Filled 2022-08-25: qty 30, 30d supply, fill #0

## 2022-08-25 MED ORDER — LOPERAMIDE HCL 2 MG PO CAPS: 2.0000 mg | ORAL_CAPSULE | Freq: Two times a day (BID) | ORAL | Status: DC | PRN

## 2022-08-25 NOTE — ED Triage Notes (Signed)
EMS brings pt in from home for "uncontrolled bowel movements" since received abilify in January;

## 2022-08-25 NOTE — ED Triage Notes (Signed)
Pt to ED via [facility] c/o psych eval. Pt reports hearing voices and feeling depressed wants to speak to psyachiatrist. Denies SI, HI. Pt admits to using cocaine yesterday and wants to detox from it. Pt also reports "uncontrollable bowel movements" since January, doesn't make it to restroom in time.

## 2022-08-25 NOTE — ED Notes (Signed)
Pt dressed out into paper scrubs with this RN and tech tracey. Pt belongings placed in belongings bag  Black shoes White socks Navy pants Black shirt Vape Pack of cigars Lighter wallet

## 2022-08-25 NOTE — BH Assessment (Addendum)
This Probation officer provided pt with supportive therapy. Per psych NP Lynder Parents., Patient does not meet inpatient criteria.   Patient provided with outpatient substance abuse treatment resources and was encouraged to follow up immediately.

## 2022-08-25 NOTE — Discharge Instructions (Addendum)
Go to [facility] as advised.   Take zofran and imodium for nausea, vomiting, and diarrhea.

## 2022-08-25 NOTE — ED Provider Notes (Signed)
[facility] Provider Note    Event Date/Time   First MD Initiated Contact with Patient 08/25/22 2109     (approximate)   History   Psychiatric Evaluation   HPI  Cody Young is a 32 y.o. male   Past medical history of cocaine use, suicidal ideation and depression self-reported bipolar on Abilify who presents to the emergency department with suicidal ideation and hallucinations requesting psychiatric evaluation.  He states that he uses cocaine frequently and thinks he is withdrawing.  He also has explosive diarrhea watery no blood as well as nausea and vomiting over the last 3 days no known sick contacts travel or recent antibiotics.  No GI bleeding.  Aside from cocaine 2 days ago, no other coingestions alcohol use.  Suicidal ideation is passive no attempts or self-harm.  No homicidal ideation.  Vague hallucinations.  No other acute medical complaints.  External Medical Documents Reviewed: Discharge summary from December 2023 for suicidal ideation seen by psychiatry      Physical Exam   Triage Vital Signs: ED Triage Vitals  Enc Vitals Group     BP 08/25/22 2048 (!) 137/98     Pulse Rate 08/25/22 2048 80     Resp 08/25/22 2048 16     Temp 08/25/22 2048 97.7 F (36.5 C)     Temp Source 08/25/22 2048 Oral     SpO2 08/25/22 2036 98 %     Weight 08/25/22 2049 257 lb (116.6 kg)     Height 08/25/22 2049 '6\' 2"'$  (1.88 m)     Head Circumference --      Peak Flow --      Pain Score 08/25/22 2049 0     Pain Loc --      Pain Edu? --      Excl. in [place]? --     Most recent vital signs: Vitals:   08/25/22 2036 08/25/22 2048  BP:  (!) 137/98  Pulse:  80  Resp:  16  Temp:  97.7 F (36.5 C)  SpO2: 98% 97%    General: Awake, no distress.  CV:  Good peripheral perfusion.  Resp:  Normal effort.  Abd:  No distention.  Other:  Awake alert comfortable appearing slightly hypertensive otherwise vital signs within normal limits.  Does not appear to be  in acute withdrawal nor toxidromic -no sympathomimetic signs tremors fasciculations.  His abdomen soft nontender to deep palpation all quadrants.  Skin appears warm well-perfused.  He appears euvolemic.   ED Results / Procedures / Treatments   Labs (all labs ordered are listed, but only abnormal results are displayed) Labs Reviewed  COMPREHENSIVE METABOLIC PANEL - Abnormal; Notable for the following components:      Result Value   Glucose, Bld 109 (*)    AST 14 (*)    All other components within normal limits  SALICYLATE LEVEL - Abnormal; Notable for the following components:   Salicylate Lvl [id number] (*)    All other components within normal limits  ACETAMINOPHEN LEVEL - Abnormal; Notable for the following components:   Acetaminophen (Tylenol), Serum <10 (*)    All other components within normal limits  CBC - Abnormal; Notable for the following components:   Hemoglobin 17.1 (*)    All other components within normal limits  ETHANOL  URINE DRUG SCREEN, QUALITATIVE ([facility] ONLY)     I ordered and reviewed the above labs they are notable for his cell counts are normal except for some elevated  hemoglobin 17.1 not markedly elevated from 16.8 prior, electrolytes within normal limits and negative acetaminophen and salicylate levels   PROCEDURES:  Critical Care performed: No  Procedures   MEDICATIONS ORDERED IN ED: Medications  loperamide (IMODIUM) capsule 2 mg (has no administration in time range)  ondansetron (ZOFRAN-ODT) disintegrating tablet 4 mg (has no administration in time range)     IMPRESSION / MDM / ASSESSMENT AND PLAN / ED COURSE  I reviewed the triage vital signs and the nursing notes.                                Patient's presentation is most consistent with acute presentation with potential threat to life or bodily function.  Differential diagnosis includes, but is not limited to, suicidal ideation, acute decompensated psychiatric illness, intoxication,  withdrawal    MDM: This patient with symptoms of viral gastroenteritis nausea vomiting and diarrhea no blood normal vital signs benign abdominal exam looks well euvolemic.  Will give Imodium and Zofran for symptoms.  He does not appear to be in acute withdrawal nor intoxicated.  He has no other medical complaints at this time request to see psychiatry voluntarily.  I see no rationale to IVC this patient who has passive suicidal ideation and is here voluntarily to see psychiatry.  Medically cleared.         FINAL CLINICAL IMPRESSION(S) / ED DIAGNOSES   Final diagnoses:  Suicidal ideation  Diarrhea, unspecified type     Rx / DC Orders   ED Discharge Orders          Ordered    loperamide (IMODIUM A-D) 2 MG tablet  4 times daily PRN        08/25/22 2151    ondansetron (ZOFRAN) 4 MG tablet  Daily PRN        08/25/22 2151             Note:  This document was prepared using Dragon voice recognition software and may include unintentional dictation errors.    Lucillie Garfinkel, MD 08/25/22 2151

## 2022-08-26 ENCOUNTER — Emergency Department (EMERGENCY_DEPARTMENT_HOSPITAL)
Admission: EM | Admit: 2022-08-26 | Discharge: 2022-08-26 | Disposition: A | Discharge status: Other Acute Inpatient Hospital | Payer: No Typology Code available for payment source | Attending: Student in an Organized Health Care Education/Training Program | Admitting: Student in an Organized Health Care Education/Training Program

## 2022-08-26 ENCOUNTER — Other Ambulatory Visit: Payer: Self-pay

## 2022-08-26 ENCOUNTER — Inpatient Hospital Stay (HOSPITAL_COMMUNITY)
Admission: AD | Admit: 2022-08-26 | Discharge: 2022-09-04 | DRG: 885 | Disposition: A | Discharge status: Home / Self Care | Payer: No Typology Code available for payment source | Source: Intra-hospital | Attending: Psychiatry | Admitting: Psychiatry

## 2022-08-26 ENCOUNTER — Telehealth: Payer: Self-pay | Admitting: Student in an Organized Health Care Education/Training Program

## 2022-08-26 DIAGNOSIS — F1010 Alcohol abuse, uncomplicated: Secondary | ICD-10-CM | POA: Diagnosis present

## 2022-08-26 DIAGNOSIS — F32A Depression, unspecified: Secondary | ICD-10-CM | POA: Insufficient documentation

## 2022-08-26 DIAGNOSIS — Z20822 Contact with and (suspected) exposure to covid-19: Secondary | ICD-10-CM | POA: Insufficient documentation

## 2022-08-26 DIAGNOSIS — F314 Bipolar disorder, current episode depressed, severe, without psychotic features: Principal | ICD-10-CM | POA: Diagnosis present

## 2022-08-26 DIAGNOSIS — G4700 Insomnia, unspecified: Secondary | ICD-10-CM | POA: Diagnosis present

## 2022-08-26 DIAGNOSIS — Z23 Encounter for immunization: Secondary | ICD-10-CM

## 2022-08-26 DIAGNOSIS — Z79899 Other long term (current) drug therapy: Secondary | ICD-10-CM | POA: Diagnosis not present

## 2022-08-26 DIAGNOSIS — F17210 Nicotine dependence, cigarettes, uncomplicated: Secondary | ICD-10-CM | POA: Insufficient documentation

## 2022-08-26 DIAGNOSIS — F1590 Other stimulant use, unspecified, uncomplicated: Secondary | ICD-10-CM | POA: Diagnosis present

## 2022-08-26 DIAGNOSIS — R41843 Psychomotor deficit: Secondary | ICD-10-CM | POA: Diagnosis present

## 2022-08-26 DIAGNOSIS — Z91128 Patient's intentional underdosing of medication regimen for other reason: Secondary | ICD-10-CM

## 2022-08-26 DIAGNOSIS — F419 Anxiety disorder, unspecified: Secondary | ICD-10-CM | POA: Diagnosis present

## 2022-08-26 DIAGNOSIS — R45851 Suicidal ideations: Secondary | ICD-10-CM | POA: Diagnosis present

## 2022-08-26 DIAGNOSIS — F1410 Cocaine abuse, uncomplicated: Secondary | ICD-10-CM

## 2022-08-26 DIAGNOSIS — F1490 Cocaine use, unspecified, uncomplicated: Secondary | ICD-10-CM

## 2022-08-26 DIAGNOSIS — F319 Bipolar disorder, unspecified: Secondary | ICD-10-CM

## 2022-08-26 DIAGNOSIS — R443 Hallucinations, unspecified: Secondary | ICD-10-CM

## 2022-08-26 DIAGNOSIS — F1290 Cannabis use, unspecified, uncomplicated: Secondary | ICD-10-CM

## 2022-08-26 DIAGNOSIS — F1910 Other psychoactive substance abuse, uncomplicated: Secondary | ICD-10-CM

## 2022-08-26 HISTORY — DX: Bipolar disorder, unspecified: F31.9

## 2022-08-26 LAB — COMPREHENSIVE METABOLIC PANEL
ALT: 13 U/L (ref 0–44)
AST: 15 U/L (ref 15–41)
Albumin: 4 g/dL (ref 3.5–5.0)
Alkaline Phosphatase: 54 U/L (ref 38–126)
Anion gap: 6 (ref 5–15)
BUN: 14 mg/dL (ref 6–20)
CO2: 28 mmol/L (ref 22–32)
Calcium: 9.2 mg/dL (ref 8.9–10.3)
Chloride: 104 mmol/L (ref 98–111)
Creatinine, Ser: 0.95 mg/dL (ref 0.61–1.24)
GFR, Estimated: >60 mL/min (ref >60)
Glucose, Bld: 91 mg/dL (ref 70–99)
Potassium: 3.7 mmol/L (ref 3.5–5.1)
Sodium: 138 mmol/L (ref 135–145)
Total Bilirubin: 0.8 mg/dL (ref 0.3–1.2)
Total Protein: 6.8 g/dL (ref 6.5–8.1)

## 2022-08-26 LAB — SALICYLATE LEVEL: Salicylate Lvl: <7 mg/dL — ABNORMAL LOW (ref 7.0–30.0)

## 2022-08-26 LAB — URINE DRUG SCREEN, QUALITATIVE (ARMC ONLY)
Amphetamines, Ur Screen: NOT DETECTED
Amphetamines, Ur Screen: NOT DETECTED
Barbiturates, Ur Screen: NOT DETECTED
Barbiturates, Ur Screen: NOT DETECTED
Benzodiazepine, Ur Scrn: NOT DETECTED
Benzodiazepine, Ur Scrn: NOT DETECTED
Cannabinoid 50 Ng, Ur ~~LOC~~: POSITIVE — AB
Cannabinoid 50 Ng, Ur ~~LOC~~: POSITIVE — AB
Cocaine Metabolite,Ur ~~LOC~~: POSITIVE — AB
Cocaine Metabolite,Ur ~~LOC~~: POSITIVE — AB
MDMA (Ecstasy)Ur Screen: NOT DETECTED
MDMA (Ecstasy)Ur Screen: NOT DETECTED
Methadone Scn, Ur: NOT DETECTED
Methadone Scn, Ur: NOT DETECTED
Opiate, Ur Screen: NOT DETECTED
Opiate, Ur Screen: NOT DETECTED
Phencyclidine (PCP) Ur S: NOT DETECTED
Phencyclidine (PCP) Ur S: NOT DETECTED
Tricyclic, Ur Screen: NOT DETECTED
Tricyclic, Ur Screen: NOT DETECTED

## 2022-08-26 LAB — CBC
HCT: 50.1 % (ref 39.0–52.0)
Hemoglobin: 16.9 g/dL (ref 13.0–17.0)
MCH: 31.8 pg (ref 26.0–34.0)
MCHC: 33.7 g/dL (ref 30.0–36.0)
MCV: 94.4 fL (ref 80.0–100.0)
Platelets: 160 10*3/uL (ref 150–400)
RBC: 5.31 MIL/uL (ref 4.22–5.81)
RDW: 12.8 % (ref 11.5–15.5)
WBC: 6.6 10*3/uL (ref 4.0–10.5)
nRBC: 0 % (ref 0.0–0.2)

## 2022-08-26 LAB — RESP PANEL BY RT-PCR (RSV, FLU A&B, COVID)  RVPGX2
Influenza A by PCR: NEGATIVE
Influenza B by PCR: NEGATIVE
Resp Syncytial Virus by PCR: NEGATIVE
SARS Coronavirus 2 by RT PCR: NEGATIVE

## 2022-08-26 LAB — ACETAMINOPHEN LEVEL: Acetaminophen (Tylenol), Serum: <10 ug/mL — ABNORMAL LOW (ref 10–30)

## 2022-08-26 LAB — ETHANOL: Alcohol, Ethyl (B): <10 mg/dL (ref <10)

## 2022-08-26 MED ORDER — HYDROXYZINE HCL 25 MG PO TABS
25.0000 mg | ORAL_TABLET | Freq: Four times a day (QID) | ORAL | Status: AC | PRN
  Administered 2022-08-27 – 2022-08-29 (×3): 25 mg via ORAL
  Filled 2022-08-26 (×3): qty 1

## 2022-08-26 MED ORDER — LORAZEPAM 2 MG/ML IJ SOLN
2.0000 mg | Freq: Two times a day (BID) | INTRAMUSCULAR | Status: AC | PRN
  Filled 2022-08-26: qty 1

## 2022-08-26 MED ORDER — TRAZODONE HCL 100 MG PO TABS
100.0000 mg | ORAL_TABLET | Freq: Every evening | ORAL | Status: DC | PRN
  Administered 2022-08-27 – 2022-08-28 (×2): 100 mg via ORAL
  Filled 2022-08-26 (×3): qty 1

## 2022-08-26 MED ORDER — THIAMINE HCL 100 MG/ML IJ SOLN: 100.0000 mg | Freq: Every day | INTRAMUSCULAR | Status: DC

## 2022-08-26 MED ORDER — ALUM & MAG HYDROXIDE-SIMETH 200-200-20 MG/5ML PO SUSP: 30.0000 mL | ORAL | Status: DC | PRN

## 2022-08-26 MED ORDER — ADULT MULTIVITAMIN W/MINERALS CH
1.0000 | ORAL_TABLET | Freq: Every day | ORAL | Status: DC
  Administered 2022-08-27 – 2022-09-04 (×9): 1 via ORAL
  Filled 2022-08-26 (×12): qty 1

## 2022-08-26 MED ORDER — LORAZEPAM 1 MG PO TABS: 1.0000 mg | ORAL_TABLET | Freq: Four times a day (QID) | ORAL | Status: DC | PRN

## 2022-08-26 MED ORDER — HYDROXYZINE HCL 25 MG PO TABS: 25.0000 mg | ORAL_TABLET | Freq: Four times a day (QID) | ORAL | Status: DC | PRN

## 2022-08-26 MED ORDER — ONDANSETRON 4 MG PO TBDP: 4.0000 mg | ORAL_TABLET | Freq: Four times a day (QID) | ORAL | Status: DC | PRN

## 2022-08-26 MED ORDER — HYDROXYZINE HCL 25 MG PO TABS
25.0000 mg | ORAL_TABLET | ORAL | Status: AC
  Filled 2022-08-26 (×2): qty 1

## 2022-08-26 MED ORDER — LORAZEPAM 1 MG PO TABS: 0.0000 mg | ORAL_TABLET | Freq: Two times a day (BID) | ORAL | Status: DC

## 2022-08-26 MED ORDER — THIAMINE HCL 100 MG/ML IJ SOLN: 100.0000 mg | Freq: Once | INTRAMUSCULAR | Status: DC

## 2022-08-26 MED ORDER — LORAZEPAM 0.5 MG PO TABS: 0.5000 mg | ORAL_TABLET | Freq: Four times a day (QID) | ORAL | Status: DC

## 2022-08-26 MED ORDER — LOPERAMIDE HCL 2 MG PO CAPS: 2.0000 mg | ORAL_CAPSULE | ORAL | Status: DC | PRN

## 2022-08-26 MED ORDER — LORAZEPAM 1 MG PO TABS
1.0000 mg | ORAL_TABLET | Freq: Four times a day (QID) | ORAL | Status: AC | PRN
  Administered 2022-08-26 – 2022-08-27 (×2): 1 mg via ORAL
  Filled 2022-08-26: qty 1

## 2022-08-26 MED ORDER — LOPERAMIDE HCL 2 MG PO CAPS: 2.0000 mg | ORAL_CAPSULE | ORAL | Status: AC | PRN

## 2022-08-26 MED ORDER — LORAZEPAM 1 MG PO TABS
0.0000 mg | ORAL_TABLET | Freq: Four times a day (QID) | ORAL | Status: DC
  Filled 2022-08-26: qty 1

## 2022-08-26 MED ORDER — HALOPERIDOL 5 MG PO TABS: 5.0000 mg | ORAL_TABLET | Freq: Two times a day (BID) | ORAL | Status: AC | PRN

## 2022-08-26 MED ORDER — VITAMIN B-1 100 MG PO TABS
100.0000 mg | ORAL_TABLET | Freq: Every day | ORAL | Status: DC
  Administered 2022-08-27 – 2022-09-04 (×9): 100 mg via ORAL
  Filled 2022-08-26 (×11): qty 1

## 2022-08-26 MED ORDER — HALOPERIDOL LACTATE 5 MG/ML IJ SOLN: 5.0000 mg | Freq: Two times a day (BID) | INTRAMUSCULAR | Status: AC | PRN

## 2022-08-26 MED ORDER — LORAZEPAM 2 MG PO TABS: 0.0000 mg | ORAL_TABLET | Freq: Four times a day (QID) | ORAL | Status: DC

## 2022-08-26 MED ORDER — MAGNESIUM HYDROXIDE 400 MG/5ML PO SUSP: 30.0000 mL | Freq: Every day | ORAL | Status: DC | PRN

## 2022-08-26 MED ORDER — HYDROXYZINE HCL 25 MG PO TABS
25.0000 mg | ORAL_TABLET | ORAL | Status: AC
  Administered 2022-08-26: 25 mg via ORAL
  Filled 2022-08-26: qty 1

## 2022-08-26 MED ORDER — DIVALPROEX SODIUM 500 MG PO DR TAB
500.0000 mg | DELAYED_RELEASE_TABLET | Freq: Two times a day (BID) | ORAL | Status: DC
  Administered 2022-08-27 – 2022-08-30 (×7): 500 mg via ORAL
  Filled 2022-08-26 (×9): qty 1

## 2022-08-26 MED ORDER — DIPHENHYDRAMINE HCL 25 MG PO CAPS: 50.0000 mg | ORAL_CAPSULE | Freq: Two times a day (BID) | ORAL | Status: AC | PRN

## 2022-08-26 MED ORDER — ADULT MULTIVITAMIN W/MINERALS CH
1.0000 | ORAL_TABLET | Freq: Every day | ORAL | Status: DC
  Filled 2022-08-26: qty 1

## 2022-08-26 MED ORDER — ONDANSETRON 4 MG PO TBDP: 4.0000 mg | ORAL_TABLET | Freq: Four times a day (QID) | ORAL | Status: AC | PRN

## 2022-08-26 MED ORDER — ARIPIPRAZOLE 10 MG PO TABS
10.0000 mg | ORAL_TABLET | Freq: Every day | ORAL | 0 refills | Status: DC
  Filled 2022-08-26: qty 30, 30d supply, fill #0

## 2022-08-26 MED ORDER — ARIPIPRAZOLE 10 MG PO TABS
10.0000 mg | ORAL_TABLET | Freq: Every day | ORAL | Status: DC
  Administered 2022-08-27 – 2022-09-04 (×9): 10 mg via ORAL
  Filled 2022-08-26 (×6): qty 1
  Filled 2022-08-26: qty 7
  Filled 2022-08-26 (×3): qty 1
  Filled 2022-08-26: qty 7

## 2022-08-26 MED ORDER — LORAZEPAM 2 MG PO TABS: 0.0000 mg | ORAL_TABLET | Freq: Two times a day (BID) | ORAL | Status: DC

## 2022-08-26 MED ORDER — ACETAMINOPHEN 325 MG PO TABS
650.0000 mg | ORAL_TABLET | Freq: Four times a day (QID) | ORAL | Status: DC | PRN
  Administered 2022-08-27 – 2022-08-30 (×2): 650 mg via ORAL
  Filled 2022-08-26 (×2): qty 2

## 2022-08-26 MED ORDER — VITAMIN B-1 100 MG PO TABS
100.0000 mg | ORAL_TABLET | Freq: Every day | ORAL | Status: DC
  Filled 2022-08-26: qty 1

## 2022-08-26 MED ORDER — THIAMINE MONONITRATE 100 MG PO TABS
100.0000 mg | ORAL_TABLET | Freq: Every day | ORAL | Status: DC
  Administered 2022-08-26: 100 mg via ORAL
  Filled 2022-08-26: qty 1

## 2022-08-26 MED ORDER — LORAZEPAM 1 MG PO TABS: 2.0000 mg | ORAL_TABLET | Freq: Two times a day (BID) | ORAL | Status: AC | PRN

## 2022-08-26 MED ORDER — TRAZODONE HCL 50 MG PO TABS
50.0000 mg | ORAL_TABLET | Freq: Every evening | ORAL | Status: DC | PRN
  Administered 2022-08-26: 50 mg via ORAL
  Filled 2022-08-26: qty 1

## 2022-08-26 MED ORDER — DIVALPROEX SODIUM 500 MG PO DR TAB
500.0000 mg | DELAYED_RELEASE_TABLET | Freq: Two times a day (BID) | ORAL | 0 refills | Status: DC
  Filled 2022-08-26: qty 60, 30d supply, fill #0

## 2022-08-26 MED ORDER — DIPHENHYDRAMINE HCL 50 MG/ML IJ SOLN: 50.0000 mg | Freq: Two times a day (BID) | INTRAMUSCULAR | Status: AC | PRN

## 2022-08-26 NOTE — BH Assessment (Addendum)
Patient has been accepted to [facility] on today 08/26/22 pending negative Covid results.  Patient assigned to room 402, bed# 1. Accepting physician is Dr. Louretta Shorten.  Call report to [phone] Representative was Danika.   ER Staff is aware of it:  Luann, ER Secretary  Dr. Jari Pigg, ER MD  [place], Patient's Nurse

## 2022-08-26 NOTE — ED Notes (Signed)
Hospital meal provided.  100% consumed, pt tolerated w/o complaints.  Waste discarded appropriately.  Verified correct patient and correct discharge papers given. Pt alert and oriented X 4, stable for discharge. RR even and unlabored, color WNL. Discussed discharge instructions and follow-up as directed. Discharge medications discussed, when prescribed. Pt had opportunity to ask questions, and RN available to provide patient and/or family education. Left with all of belongings.

## 2022-08-26 NOTE — ED Notes (Signed)
Pt updated; given pillow; tv adjusted for pt; pt received dinner tray and drink.

## 2022-08-26 NOTE — ED Notes (Signed)
[facility] called this RN and stated pt denied bc of mention of "stool incontinence since January, dizziness when walking long distances and hallucinations while on medications" in his paperwork that was faxed to them. Staff at [facility] recommend [facility]. [facility] psych team notified via secure chat.

## 2022-08-26 NOTE — Tx Team (Signed)
Initial Treatment Plan 08/26/2022 11:29 PM Cody Young. [id number]    PATIENT STRESSORS: Health problems   Medication change or noncompliance   Substance abuse     PATIENT STRENGTHS: General fund of knowledge  Motivation for treatment/growth    PATIENT IDENTIFIED PROBLEMS: Risk SI  Psychosis([facility])  "Get meds right, stay off substances"                 DISCHARGE CRITERIA:  Adequate post-discharge living arrangements Improved stabilization in mood, thinking, and/or behavior Verbal commitment to aftercare and medication compliance  PRELIMINARY DISCHARGE PLAN: Attend PHP/IOP Outpatient therapy Placement in alternative living arrangements  PATIENT/FAMILY INVOLVEMENT: This treatment plan has been presented to and reviewed with the patient, Cody Young..  The patient and family have been given the opportunity to ask questions and make suggestions.  Cody Crosby, RN 08/26/2022, 11:29 PM

## 2022-08-26 NOTE — ED Notes (Signed)
Pt used phone to call mother and then handed it to RN. Mother is requesting phone call from psych team. Explained that pt had already completed consult. Gave contact  info to C. Richardson Landry psych NP- if wished to gain collateral.  Cody Young Mother [phone]

## 2022-08-26 NOTE — Telephone Encounter (Signed)
Patient called by exam that he needed refills on his Abilify as well as Depakote and Klonopin.  Will refill his Abilify and Depakote as these are listed as chronic medications.

## 2022-08-26 NOTE — ED Notes (Signed)
Hospital meal provided.  100% consumed, pt tolerated w/o complaints.  Waste discarded appropriately.   

## 2022-08-26 NOTE — ED Notes (Signed)
Psych and TTS at bedside.

## 2022-08-26 NOTE — BH Assessment (Addendum)
Comprehensive Clinical Assessment (CCA) Screening, Triage and Referral Note  08/26/2022 Cody Young [id number]  Cody Young, 32 year old male who presents to [facility] ED voluntarily for treatment. Per triage note, Pt to ED voluntary asking to be admitted for alcohol detox and "psych issues" (bipolar). Drinks 12 pack beer/day, last drink yesterday. States lives in basement with people who use drugs and doesn't want to be there and wants "to be admitted for 3-7 days" for alcohol detox. Also complains of "eye issue". Irritation to eyes and states seeing "plasma waves" and also "hearing voices" of dead father. Was seen here for same yesterday and discharged this morning. States has SI, "I would slit my wrists".   During TTS assessment pt presents alert and oriented x 4, restless but cooperative, and mood-congruent with affect. The pt does not appear to be responding to internal or external stimuli. Neither is the pt presenting with any delusional thinking. Pt verified the information provided to triage RN.   Pt was recently discharged this morning and returned after making phone call requesting refills for his medications. Patient reports he is "having delusions", not sleeping, and self-medicating with alcohol and cocaine. Patient reports he drinks 12 pack of beer daily and recently used 2 days ago. Patient states he is non-compliant with his medication. Patient was admitted to [facility] December 2023; however, he did not follow up for outpatient treatment when discharged. Extremely difficult to follow patient's history as he is constantly changing his story. Patient explains his need for rehab while also saying he has "delusions, auditory and visual hallucinations" Patient states he receives disability and lives with "2 crack addicts" in which he pays $500 to stay in the basement. Patient presents with depressed mood but is coherent, speaking in linear sentences. Patient endorsing passive SI. "I want help for  my substance use but if I cannot go somewhere and stay a few days, I will kill myself." Patient was given outpatient resources for detox treatment. Patient is waiting to hear back from [facility] for possible admission. Writer faxed requested documents.    Per Christal, NP, patient is recommended for inpatient psychiatric admission.    Chief Complaint:  Chief Complaint  Patient presents with   alcohol detox   Visit Diagnosis: Suicidal ideation  Patient Reported Information How did you hear about [place]? Self  What Is the Reason for Your Visit/Call Today? Detox, suicidal thoughts  How Long Has This Been Causing You Problems? <Week  What Do You Feel Would Help You the Most Today? Medication(s); Treatment for Depression or other mood problem; Alcohol or Drug Use Treatment; Housing Assistance; Transportation Assistance   Have You Recently Had Any Thoughts About [place]? Yes  Are You Planning to Commit Suicide/Harm Yourself At This time? No   Have you Recently Had Thoughts About [place]? No  Are You Planning to Harm Someone at This Time? No  Explanation: No data recorded  Have You Used Any Alcohol or Drugs in the Past 24 Hours? Yes  How Long Ago Did You Use Drugs or Alcohol? No data recorded What Did You Use and How Much? Cocaine and marijuana   Do You Currently Have a Therapist/Psychiatrist? No  Name of Therapist/Psychiatrist: No data recorded  Have You Been Recently Discharged From Any Office Practice or Programs? No  Explanation of Discharge From Practice/Program: No data recorded   CCA Screening Triage Referral Assessment Type of Contact: Face-to-Face  Telemedicine Service Delivery:   Is this Initial or Reassessment?  Date Telepsych consult ordered in [facility]:    Time Telepsych consult ordered in [facility]:    Location of Assessment: [facility] ED  Provider Location: [facility] ED    Collateral Involvement: Elchanan Dalesandro - [id number]   Does Patient Have a [place]? No data recorded Name and Contact of Legal Guardian: No data recorded If Minor and Not Living with Parent(s), Who has Custody? No data recorded Is CPS involved or ever been involved? Never  Is APS involved or ever been involved? Never   Patient Determined To Be At Risk for Harm To Self or Others Based on Review of Patient Reported Information or Presenting Complaint? Yes, for Self-Harm  Method: Plan without intent  Availability of Means: -- (States, "I can get it on the streets")  Intent: Vague intent or NA  Notification Required: No data recorded Additional Information for Danger to Others Potential: No data recorded Additional Comments for Danger to Others Potential: No data recorded Are There Guns or Other Weapons in Your Home? No  Types of Guns/Weapons: No data recorded Are These Weapons Safely Secured?                            No data recorded Who Could Verify You Are Able To Have These Secured: No data recorded Do You Have any Outstanding Charges, Pending Court Dates, Parole/Probation? Denied  Contacted To Inform of Risk of Harm To Self or Others: No data recorded  Does Patient Present under Involuntary Commitment? No    [place] of Residence: [place]   Patient Currently Receiving the Following Services: Not Receiving Services   Determination of Need: Emergent (2 hours)   Options For Referral: ED Visit; Chemical Dependency Intensive Outpatient Therapy (CDIOP); Therapeutic Triage Services   Discharge Disposition:     Eula Fried, Counselor, LCAS-A

## 2022-08-26 NOTE — ED Provider Notes (Signed)
[facility] Provider Note    None    (approximate)   History   alcohol detox   HPI  Cody Young. is a 32 y.o. male history of bipolar 1 as well as polysubstance abuse cocaine use and admitted alcohol dependence presents to the ER after being evaluated yesterday for persistent hallucinations hearing voices of his stepfather as well as suicidal ideation.  Patient also checked in stating that he wants detox.  Patient called back earlier needing refills on his Abilify as well as Depakote which were called to the pharmacy.  According to family the patient is having worsening of his mental health and was told to come back to the ER for reevaluation.     Physical Exam   Triage Vital Signs: ED Triage Vitals  Enc Vitals Group     BP 08/26/22 1059 (!) 135/98     Pulse Rate 08/26/22 1059 (!) 59     Resp 08/26/22 1059 16     Temp 08/26/22 1059 98 F (36.7 C)     Temp Source 08/26/22 1059 Oral     SpO2 08/26/22 1059 100 %     Weight 08/26/22 1100 250 lb (113.4 kg)     Height 08/26/22 1100 '6\' 2"'$  (1.88 m)     Head Circumference --      Peak Flow --      Pain Score 08/26/22 1100 0     Pain Loc --      Pain Edu? --      Excl. in [place]? --     Most recent vital signs: Vitals:   08/26/22 1059  BP: (!) 135/98  Pulse: (!) 59  Resp: 16  Temp: 98 F (36.7 C)  SpO2: 100%     Constitutional: Alert  Eyes: Conjunctivae are normal.  Head: Atraumatic. Nose: No congestion/rhinnorhea. Mouth/Throat: Mucous membranes are moist.   Neck: Painless ROM.  Cardiovascular:   Good peripheral circulation. Respiratory: Normal respiratory effort.  No retractions.  Gastrointestinal: Soft and nontender.  Musculoskeletal:  no deformity Neurologic:  MAE spontaneously. No gross focal neurologic deficits are appreciated.  Skin:  Skin is warm, dry and intact. No rash noted. Psychiatric: Mood and affect are normal. Speech and behavior are normal.    ED Results /  Procedures / Treatments   Labs (all labs ordered are listed, but only abnormal results are displayed) Labs Reviewed  CBC  COMPREHENSIVE METABOLIC PANEL  ETHANOL  SALICYLATE LEVEL  ACETAMINOPHEN LEVEL  URINE DRUG SCREEN, QUALITATIVE ([place])     EKG     RADIOLOGY    PROCEDURES:  Critical Care performed: No  Procedures   MEDICATIONS ORDERED IN ED: Medications  LORazepam (ATIVAN) tablet 0.5 mg (has no administration in time range)    Or  LORazepam (ATIVAN) tablet 0-4 mg (has no administration in time range)  LORazepam (ATIVAN) tablet 0-4 mg (has no administration in time range)  thiamine (VITAMIN B1) tablet 100 mg (has no administration in time range)    Or  thiamine (VITAMIN B1) injection 100 mg (has no administration in time range)     IMPRESSION / MDM / ASSESSMENT AND PLAN / ED COURSE  I reviewed the triage vital signs and the nursing notes.                              Differential diagnosis includes, but is not limited to, Psychosis, delirium, medication effect, noncompliance,  polysubstance abuse, Si, Hi, depression   Patient here for evaluation of hallucinations, SI and substance abuse.  Patient has psych history of bipolar.  Laboratory testing was ordered to evaluation for underlying electrolyte derangement or signs of underlying organic pathology to explain today's presentation.  Based on history and physical and laboratory evaluation, it appears that the patient's presentation is 2/2 underlying psychiatric disorder and will require further evaluation and management by inpatient psychiatry.  Disposition pending psychiatric evaluation.  The patient has been placed in psychiatric observation due to the need to provide a safe environment for the patient while obtaining psychiatric consultation and evaluation, as well as ongoing medical and medication management to treat the patient's condition.         FINAL CLINICAL IMPRESSION(S) / ED DIAGNOSES    Final diagnoses:  Polysubstance abuse ([place])  Hallucinations     Rx / DC Orders   ED Discharge Orders     None        Note:  This document was prepared using Dragon voice recognition software and may include unintentional dictation errors.    Merlyn Lot, MD 08/26/22 [phone]

## 2022-08-26 NOTE — Consult Note (Signed)
[place] Psychiatry Consult   Reason for Consult: Psychiatric Evaluation Referring Physician: Dr. Jacelyn Grip Patient Identification: Cody Young. MRN:  [id number] Principal Diagnosis: <principal problem not specified> Diagnosis:  Active Problems:   Depression   Cocaine abuse ([place])   Suicidal ideation   Total Time spent with patient: 1 hour  Subjective: "I need to get my Klonopin."  Cody Young. is a 32 y.o. male patient presented to [facility] ED via POV voluntary. Per the ED triage nurses note,  Pt to ED via [facility] c/o psych eval. Pt reports hearing voices and feeling depressed wants to speak to psyachiatrist. Denies SI, HI. Pt admits to using cocaine yesterday and wants to detox from it. Pt also reports "uncontrollable bowel movements" since January, doesn't make it to restroom in time. The patient states he was admitted the end of December and discharged. The patient shared that he was given a script which he misplaced and he requesting to get a copy. The patient stated the script was for klonopin which he missed placed.He is requesting a copy of the script. The patient shared that because he does not have his Klonopin it is causing him to use cocaine. The patient share that he went to [facility] but he was afraid to enter in the door.  This provider saw the patient face-to-face, reviewed the chart, and consulted with Dr. Jacelyn Grip on 08/25/2022 due to the patient's care. It was discussed with the EDP that the patient does not meet the criteria for admission to the psychiatric inpatient unit.  On evaluation, the patient reports being alert and oriented x 4, calm, cooperative, and mood-congruent with affect. The patient does not appear to be responding to internal or external stimuli. Neither is the patient presenting with any delusional thinking. The patient denies auditory or visual hallucinations. The patient denies any suicidal, homicidal, or self-harm ideations. The patient is not  presenting with any psychotic or paranoid behaviors. During an encounter with the patient, they could answer questions appropriately.  HPI: Per Dr. Jacelyn Grip, Cody Young. is a 33 y.o. male   Past medical history of cocaine use, suicidal ideation and depression self-reported bipolar on Abilify who presents to the emergency department with suicidal ideation and hallucinations requesting psychiatric evaluation.  He states that he uses cocaine frequently and thinks he is withdrawing.  He also has explosive diarrhea watery no blood as well as nausea and vomiting over the last 3 days no known sick contacts travel or recent antibiotics.  No GI bleeding.   Aside from cocaine 2 days ago, no other coingestions alcohol use.  Suicidal ideation is passive no attempts or self-harm.  No homicidal ideation.  Vague hallucinations.   No other acute medical complaints.  Past Psychiatric History: History reviewed. No pertinent past psychiatric history  Risk to Self:   Risk to Others:   Prior Inpatient Therapy:   Prior Outpatient Therapy:    Past Medical History: History reviewed. No pertinent past medical history. History reviewed. No pertinent surgical history. Family History: History reviewed. No pertinent family history. Family Psychiatric  History: History reviewed. No pertinent family psychiatric history Social History:  Social History   Substance and Sexual Activity  Alcohol Use Yes     Social History   Substance and Sexual Activity  Drug Use Yes   Types: Marijuana    Social History   Socioeconomic History   Marital status: Single    Spouse name: Not on file   Number of children:  Not on file   Years of education: Not on file   Highest education level: Not on file  Occupational History   Not on file  Tobacco Use   Smoking status: Every Day    Packs/day: 1.00    Types: Cigarettes   Smokeless tobacco: Never  Substance and Sexual Activity   Alcohol use: Yes   Drug use: Yes     Types: Marijuana   Sexual activity: Not on file  Other Topics Concern   Not on file  Social History Narrative   Not on file   Social Determinants of Health   Financial Resource Strain: Not on file  Food Insecurity: Unknown (06/16/2022)   Hunger Vital Sign    Worried About Running Out of Food in the Last Year: Patient refused    [place] in the Last Year: Patient refused  Transportation Needs: Unknown (06/16/2022)   PRAPARE - Hydrologist (Medical): Patient refused    Lack of Transportation (Non-Medical): Patient refused  Physical Activity: Not on file  Stress: Not on file  Social Connections: Not on file   Additional Social History:    Allergies:  No Known Allergies  Labs:  Results for orders placed or performed during the hospital encounter of 08/25/22 (from the past 48 hour(s))  Comprehensive metabolic panel     Status: Abnormal   Collection Time: 08/25/22  8:53 PM  Result Value Ref Range   Sodium 140 135 - 145 mmol/L   Potassium 4.1 3.5 - 5.1 mmol/L   Chloride 105 98 - 111 mmol/L   CO2 28 22 - 32 mmol/L   Glucose, Bld 109 (H) 70 - 99 mg/dL    Comment: Glucose reference range applies only to samples taken after fasting for at least 8 hours.   BUN 12 6 - 20 mg/dL   Creatinine, Ser 1.06 0.61 - 1.24 mg/dL   Calcium 9.2 8.9 - 10.3 mg/dL   Total Protein 7.0 6.5 - 8.1 g/dL   Albumin 4.1 3.5 - 5.0 g/dL   AST 14 (L) 15 - 41 U/L   ALT 14 0 - 44 U/L   Alkaline Phosphatase 57 38 - 126 U/L   Total Bilirubin 0.9 0.3 - 1.2 mg/dL   GFR, Estimated >60 >60 mL/min    Comment: (NOTE) Calculated using the CKD-EPI Creatinine Equation (2021)    Anion gap 7 5 - 15    Comment: Performed at [facility], [address] [place], [place] [address]  Ethanol     Status: None   Collection Time: 08/25/22  8:53 PM  Result Value Ref Range   Alcohol, Ethyl (B) <10 <10 mg/dL    Comment: (NOTE) Lowest detectable limit for serum alcohol is 10  mg/dL.  For medical purposes only. Performed at [facility], [address] [place], [place] [id number]   Salicylate level     Status: Abnormal   Collection Time: 08/25/22  8:53 PM  Result Value Ref Range   Salicylate Lvl Q000111Q (L) 7.0 - 30.0 mg/dL    Comment: Performed at [facility], [address] [place], [place] [address]  Acetaminophen level     Status: Abnormal   Collection Time: 08/25/22  8:53 PM  Result Value Ref Range   Acetaminophen (Tylenol), Serum <10 (L) 10 - 30 ug/mL    Comment: (NOTE) Therapeutic concentrations vary significantly. A range of 10-30 ug/mL  may be an effective concentration for many patients. However, some  are best treated at concentrations outside of this range. Acetaminophen concentrations >150 ug/mL at 4 hours after ingestion  and >50 ug/mL at 12 hours after ingestion are often associated with  toxic reactions.  Performed at [facility], [address] [place], [place] [address]   cbc     Status: Abnormal   Collection Time: 08/25/22  8:53 PM  Result Value Ref Range   WBC 6.1 4.0 - 10.5 K/uL   RBC 5.47 4.22 - 5.81 MIL/uL   Hemoglobin 17.1 (H) 13.0 - 17.0 g/dL   HCT 51.7 39.0 - 52.0 %   MCV 94.5 80.0 - 100.0 fL   MCH 31.3 26.0 - 34.0 pg   MCHC 33.1 30.0 - 36.0 g/dL   RDW 12.8 11.5 - 15.5 %   Platelets 176 150 - 400 K/uL   nRBC 0.0 0.0 - 0.2 %    Comment: Performed at [facility], [address], [place], [place] [id number]  Urine Drug Screen, Qualitative     Status: Abnormal   Collection Time: 08/25/22 11:22 PM  Result Value Ref Range   Tricyclic, Ur Screen NONE DETECTED NONE DETECTED   Amphetamines, Ur Screen NONE DETECTED NONE DETECTED   MDMA (Ecstasy)Ur Screen NONE DETECTED NONE DETECTED   Cocaine Metabolite,Ur [place] POSITIVE (A) NONE DETECTED   Opiate, Ur Screen NONE DETECTED NONE DETECTED   Phencyclidine (PCP) Ur S NONE DETECTED NONE DETECTED   Cannabinoid 50 Ng, Ur [place] POSITIVE (A) NONE  DETECTED   Barbiturates, Ur Screen NONE DETECTED NONE DETECTED   Benzodiazepine, Ur Scrn NONE DETECTED NONE DETECTED   Methadone Scn, Ur NONE DETECTED NONE DETECTED    Comment: (NOTE) Tricyclics + metabolites, urine    Cutoff 1000 ng/mL Amphetamines + metabolites, urine  Cutoff 1000 ng/mL MDMA (Ecstasy), urine              Cutoff 500 ng/mL Cocaine Metabolite, urine          Cutoff 300 ng/mL Opiate + metabolites, urine        Cutoff 300 ng/mL Phencyclidine (PCP), urine         Cutoff 25 ng/mL Cannabinoid, urine                 Cutoff 50 ng/mL Barbiturates + metabolites, urine  Cutoff 200 ng/mL Benzodiazepine, urine              Cutoff 200 ng/mL Methadone, urine                   Cutoff 300 ng/mL  The urine drug screen provides only a preliminary, unconfirmed analytical test result and should not be used for non-medical purposes. Clinical consideration and professional judgment should be applied to any positive drug screen result due to possible interfering substances. A more specific alternate chemical method must be used in order to obtain a confirmed analytical result. Gas chromatography / mass spectrometry (GC/MS) is the preferred confirm atory method. Performed at [facility], [address] [place], [place] [address]     Current Facility-Administered Medications  Medication Dose Route Frequency Provider Last Rate Last Admin   loperamide (IMODIUM) capsule 2 mg  2 mg Oral BID PRN Lucillie Garfinkel, MD       ondansetron (ZOFRAN-ODT) disintegrating tablet 4 mg  4 mg Oral Q8H PRN Lucillie Garfinkel, MD       Current Outpatient Medications  Medication Sig Dispense Refill   loperamide (IMODIUM A-D) 2 MG tablet Take 1 tablet (2 mg  total) by mouth 4 (four) times daily as needed for diarrhea or loose stools. 30 tablet 0   ondansetron (ZOFRAN) 4 MG tablet Take 1 tablet (4 mg total) by mouth daily as needed for nausea or vomiting. 30 tablet 1   ARIPiprazole (ABILIFY) 10 MG tablet Take 1  tablet (10 mg total) by mouth daily. 30 tablet 0   ARIPiprazole ER (ABILIFY MAINTENA) 400 MG SRER injection Inject 2 mLs (400 mg total) into the muscle every 28 (twenty-eight) days. 1 each 1   clonazePAM (KLONOPIN) 1 MG disintegrating tablet Take 1 tablet (1 mg total) by mouth at bedtime. 30 tablet 1   divalproex (DEPAKOTE) 500 MG DR tablet Take 1 tablet (500 mg total) by mouth 2 (two) times daily. 60 tablet 0   nicotine (NICODERM CQ - DOSED IN MG/24 HOURS) 14 mg/24hr patch Place 1 patch (14 mg total) onto the skin daily. 14 patch 0    Musculoskeletal: Strength & Muscle Tone: within normal limits Gait & Station: normal Patient leans: N/A Psychiatric Specialty Exam:  Presentation  General Appearance:  Appropriate for Environment  Eye Contact: Good  Speech: Clear and Coherent  Speech Volume: Normal  Handedness: Right   Mood and Affect  Mood: Depressed  Affect: Congruent   Thought Process  Thought Processes: Coherent  Descriptions of Associations:Intact  Orientation:Full (Time, Place and Person)  Thought Content:Logical  History of Schizophrenia/Schizoaffective disorder:No  Duration of Psychotic Symptoms:No data recorded Hallucinations:No data recorded Ideas of Reference:None  Suicidal Thoughts:Suicidal Thoughts: No  Homicidal Thoughts:Homicidal Thoughts: No   Sensorium  Memory: Immediate Good; Recent Good; Remote Good  Judgment: Fair  Insight: Fair   Community education officer  Concentration: Fair  Attention Span: Good  Recall: Good  Fund of Knowledge: Good  Language: Good   Psychomotor Activity  Psychomotor Activity: Psychomotor Activity: Normal   Assets  Assets: Communication Skills; Desire for Improvement; Resilience   Sleep  Sleep: Sleep: Fair Number of Hours of Sleep: 5   Physical Exam: Physical Exam Vitals and nursing note reviewed.  Constitutional:      Appearance: Normal appearance. He is normal weight.  HENT:      Head: Normocephalic and atraumatic.     Right Ear: External ear normal.     Left Ear: External ear normal.     Nose: Nose normal.  Cardiovascular:     Rate and Rhythm: Normal rate.     Pulses: Normal pulses.  Pulmonary:     Effort: Pulmonary effort is normal.  Musculoskeletal:        General: Normal range of motion.     Cervical back: Normal range of motion and neck supple.  Neurological:     General: No focal deficit present.     Mental Status: He is alert and oriented to person, place, and time.  Psychiatric:        Attention and Perception: Attention and perception normal.        Mood and Affect: Mood normal. Affect is blunt and flat.        Speech: Speech normal.        Behavior: Behavior normal. Behavior is cooperative.        Thought Content: Thought content normal.        Cognition and Memory: Cognition and memory normal.        Judgment: Judgment is impulsive and inappropriate.    Review of Systems  Psychiatric/Behavioral:  Positive for depression and substance abuse. The patient is nervous/anxious.   All other systems  reviewed and are negative.  Blood pressure (!) 137/98, pulse 80, temperature 97.7 F (36.5 C), temperature source Oral, resp. rate 16, height '6\' 2"'$  (1.88 m), weight 116.6 kg, SpO2 97 %. Body mass index is 33 kg/m.  Treatment Plan Summary: Plan   Patient does meet the criteria for psychiatric inpatient admission  Disposition: No evidence of imminent risk to self or others at present.   Patient does not meet criteria for psychiatric inpatient admission. Supportive therapy provided about ongoing stressors. Refer to IOP. Discussed crisis plan, support from social network, calling 911, coming to the Emergency Department, and calling Suicide Hotline.  Caroline Sauger, NP 08/26/2022 12:26 AM

## 2022-08-26 NOTE — ED Notes (Addendum)
Pt has not been incontinent since this RN receive him at 1400 and pt denies any issues of incontinence recently; pt steady upon ambulation and denies any recent issues with dizziness or feeling off balance. Pt completes all ADL by himself; doesn't need any assistance.

## 2022-08-26 NOTE — ED Triage Notes (Signed)
Pt to ED voluntary asking to be admitted for alcohol detox and "psych issues" (bipolar). Drinks 12 pack beer/day, last drink yesterday. States lives in basement with people who use drugs and doesn't want to be there and wants "to be admitted for 3-7 days" for alcohol detox.  Also complains of "eye issue". Irritation to eyes and staets seeing "plasma waves" and also "hearing voices" of dead father. Was seen here for same yesterday and discharged this morning. States has SI, "I would slit my wrists".

## 2022-08-26 NOTE — ED Notes (Signed)
Pt belongings: (bag 1/2): Cigarette lighter and cigarettes Dark blue sweatpants Blue hospital scrubs and white socks (from last visit) Sheryle Hail Vape Black t shirt  Bag 2/2: black sneakers

## 2022-08-26 NOTE — ED Notes (Signed)
VOL to [facility] room 402 when covid back and faxed

## 2022-08-26 NOTE — Progress Notes (Signed)
Patient ID: Cody Spillman., male   DOB: December 08, 1990, 32 y.o.   MRN: [id number]  Admission Note:  32 yr male who presents VC in no acute distress for the treatment of SI and SA (ETOH , Cocaine). Pt appears flat and depressed. Pt was calm and cooperative with admission process. Pt presents with passive SI / VH and contracts for safety upon admission. Pt denies SI/AH . Pt stated he  was feeling SI 3 days before coming in. Pt said he came down to [place] 1.5 yrs ago, pt plans to live with his mother in a week when she comes down here from [place]. Pt stated he has hard time living in his current residence and does not want to go back there upon D/C due to it being"a crack house, they drink and drug there" Pt stated he drinks 12 pk of beer every other day and does crack and THC regularly. Pt stated he has 2 prior SI attempts 2017-OD and when he was 32 yo tried to slit his wrists. Pt stated he receives SSI from [place] and it is valid through 2025 and wanted to get it changed to [place] , and possibly apply for Disability. Pt +ve for Cocaine and THC on admission.   Per Assessment: Pt to ED voluntary asking to be admitted for alcohol detox and "psych issues" (bipolar). Drinks 12 pack beer/day, last drink yesterday. States lives in basement with people who use drugs and doesn't want to be there and wants "to be admitted for 3-7 days" for alcohol detox. Also complains of "eye issue". Irritation to eyes and states seeing "plasma waves" and also "hearing voices" of dead father. Was seen here for same yesterday and discharged this morning. States has SI, "I would slit my wrists".    During TTS assessment pt presents alert and oriented x 4, restless but cooperative, and mood-congruent with affect. The pt does not appear to be responding to internal or external stimuli. Neither is the pt presenting with any delusional thinking. Pt verified the information provided to triage RN.    Pt was recently discharged this morning and  returned after making phone call requesting refills for his medications. Patient reports he is "having delusions", not sleeping, and self-medicating with alcohol and cocaine. Patient reports he drinks 12 pack of beer daily and recently used 2 days ago. Patient states he is non-compliant with his medication. Patient was admitted to [facility] December 2023; however, he did not follow up for outpatient treatment when discharged. Extremely difficult to follow patient's history as he is constantly changing his story. Patient explains his need for rehab while also saying he has "delusions, auditory and visual hallucinations" Patient states he receives disability and lives with "2 crack addicts" in which he pays $500 to stay in the basement. Patient presents with depressed mood but is coherent, speaking in linear sentences. Patient endorsing passive SI. "I want help for my substance use but if I cannot go somewhere and stay a few days, I will kill myself." Patient was given outpatient resources for detox treatment. Patient is waiting to hear back from [facility] for possible admission.  A: Skin was assessed and found to be clear of any abnormal marks apart from a scar on  back, back of his head, R-leg , pt has healing sores from blisters on L-hand from smoking crack , healing blister L-great toe. PT searched and no contraband found, POC and unit policies explained and understanding verbalized. Consents obtained. Food and fluids offered,  and  accepted.   R:Pt had no additional questions or concerns.

## 2022-08-26 NOTE — ED Notes (Signed)
Pt signed consent for transfer to [facility] and transport with this RN as witness; both documents given to Dietitian.

## 2022-08-26 NOTE — ED Notes (Signed)
Pt used phone to call detox facilities from resources provided by hospital. Pt hopeful that he can get into [facility] in [place], [place]. He has pre-screening call for that facility is approx 30 minutes.

## 2022-08-26 NOTE — ED Notes (Signed)
Pt with safe transport to [facility]. Belongings given to safe transport along with paperwork.

## 2022-08-26 NOTE — Consult Note (Signed)
[facility] Consult   Reason for Consult: SI, hallucinations, substance abuse  Referring Physician:  Dr. Quentin Cornwall  Patient Identification: Cody Young. MRN:  [id number] Principal Diagnosis: Suicidal ideation Diagnosis:  Principal Problem:   Suicidal ideation Active Problems:   Depression   Cocaine abuse ([place])   Hallucinations   Total Time spent with patient: 1 hour  Subjective: " I need some help". Cody Young. is a 32 y.o. male with suicidal ideation, worsening hallucinations, and request for detox.Marland Kitchen  HPI: Chart reviewed and patient seen face-to-face.  Patient with known bipolar 1 disorder and polysubstance abuse including cocaine and alcohol dependence presented to the ED voluntarily for persistent hallucinations, suicidal ideation, and requesting detox. Patient reports he is currently residing in a basement with substance abusers. Reports worsening delusions and auditory hallucinations instructing self-harm. Admits to noncompliance with prescribed Abilify and Depakote, last taken 1 week ago, opting to self-medicate with alcohol.  Currently endorses suicidal ideation with a plan and intent to slit wrists. Sleep is poor, appetite is variable, denies homicidal ideation. UDS (+) cocaine and marijuana.  Admits to drinking a 12 pack of beer, last drink 2 days ago. EToH < 10.   Collateral: Pedram Castor (mother) [phone]. The patient's mother concerned about worsening mental health. She currently resides in [place] and is planning to return to [facility] Wednesday, 3/13.  Mother reports the patient is currently residing in a basement with substance abusers she feels that he "will harm himself" if he does not get help". The patient's mother reports he has a history of mania, wandering "for miles a day" in the streets in pajamas and no shoes on, erratic behavior, poor sleep hygiene. She states, "he needs his Abilify shot and Klonopin to keep him calm".  She states,  "he have to be stabilized to live in the home because he gets dangerous". Mother plans to manage patient's medication and regain legal guardianship upon return.   Past Psychiatric History: Diagnosed with bipolar 1 disorder, noncompliant with medications. Previous ED visit in December, 2023 for depression and suicidal ideation, started on medications and referred to [facility] but did not follow-up.    Risk to Self:  Yes Risk to Others:  No Prior Inpatient Therapy:  Yes Prior Outpatient Therapy:    Past Medical History:  Past Medical History:  Diagnosis Date   Bipolar 1 disorder ([place])     Past Surgical History:  Procedure Laterality Date   BRAIN SURGERY     Family History: History reviewed. No pertinent family history. Family Psychiatric  History: None reported. Social History:  Social History   Substance and Sexual Activity  Alcohol Use Yes   Alcohol/week: 12.0 standard drinks of alcohol   Types: 12 Cans of beer per week   Comment: 12 beers per day     Social History   Substance and Sexual Activity  Drug Use Yes   Types: Marijuana, Cocaine   Comment: last use cocaine was 08/25/22    Social History   Socioeconomic History   Marital status: Single    Spouse name: Not on file   Number of children: Not on file   Years of education: Not on file   Highest education level: Not on file  Occupational History   Not on file  Tobacco Use   Smoking status: Every Day    Packs/day: 1.00    Types: Cigarettes   Smokeless tobacco: Never  Substance and Sexual Activity   Alcohol use: Yes  Alcohol/week: 12.0 standard drinks of alcohol    Types: 12 Cans of beer per week    Comment: 12 beers per day   Drug use: Yes    Types: Marijuana, Cocaine    Comment: last use cocaine was 08/25/22   Sexual activity: Not on file  Other Topics Concern   Not on file  Social History Narrative   Not on file   Social Determinants of Health   Financial Resource Strain: Not on file  Food Insecurity:  Unknown (06/16/2022)   Hunger Vital Sign    Worried About Running Out of Food in the Last Year: Patient refused    [place] in the Last Year: Patient refused  Transportation Needs: Unknown (06/16/2022)   PRAPARE - Hydrologist (Medical): Patient refused    Lack of Transportation (Non-Medical): Patient refused  Physical Activity: Not on file  Stress: Not on file  Social Connections: Not on file   Additional Social History:    Allergies:  No Known Allergies  Labs:  Results for orders placed or performed during the hospital encounter of 08/26/22 (from the past 48 hour(s))  Comprehensive metabolic panel     Status: None   Collection Time: 08/26/22 11:06 AM  Result Value Ref Range   Sodium 138 135 - 145 mmol/L   Potassium 3.7 3.5 - 5.1 mmol/L   Chloride 104 98 - 111 mmol/L   CO2 28 22 - 32 mmol/L   Glucose, Bld 91 70 - 99 mg/dL    Comment: Glucose reference range applies only to samples taken after fasting for at least 8 hours.   BUN 14 6 - 20 mg/dL   Creatinine, Ser 0.95 0.61 - 1.24 mg/dL   Calcium 9.2 8.9 - 10.3 mg/dL   Total Protein 6.8 6.5 - 8.1 g/dL   Albumin 4.0 3.5 - 5.0 g/dL   AST 15 15 - 41 U/L   ALT 13 0 - 44 U/L   Alkaline Phosphatase 54 38 - 126 U/L   Total Bilirubin 0.8 0.3 - 1.2 mg/dL   GFR, Estimated >60 >60 mL/min    Comment: (NOTE) Calculated using the CKD-EPI Creatinine Equation (2021)    Anion gap 6 5 - 15    Comment: Performed at [facility], [address] [place], [place] [address]  Ethanol     Status: None   Collection Time: 08/26/22 11:06 AM  Result Value Ref Range   Alcohol, Ethyl (B) <10 <10 mg/dL    Comment: (NOTE) Lowest detectable limit for serum alcohol is 10 mg/dL.  For medical purposes only. Performed at [facility], [address] [place], [place] [id number]   Salicylate level     Status: Abnormal   Collection Time: 08/26/22 11:06 AM  Result Value Ref Range   Salicylate Lvl  Q000111Q (L) 7.0 - 30.0 mg/dL    Comment: Performed at [facility], [address] [place], [place] [address]  Acetaminophen level     Status: Abnormal   Collection Time: 08/26/22 11:06 AM  Result Value Ref Range   Acetaminophen (Tylenol), Serum <10 (L) 10 - 30 ug/mL    Comment: (NOTE) Therapeutic concentrations vary significantly. A range of 10-30 ug/mL  may be an effective concentration for many patients. However, some  are best treated at concentrations outside of this range. Acetaminophen concentrations >150 ug/mL at 4 hours after ingestion  and >50 ug/mL at 12 hours after ingestion are often associated with  toxic reactions.  Performed at [facility], [address] [place], [place] [address]   cbc     Status: None   Collection Time: 08/26/22 11:06 AM  Result Value Ref Range   WBC 6.6 4.0 - 10.5 K/uL   RBC 5.31 4.22 - 5.81 MIL/uL   Hemoglobin 16.9 13.0 - 17.0 g/dL   HCT 50.1 39.0 - 52.0 %   MCV 94.4 80.0 - 100.0 fL   MCH 31.8 26.0 - 34.0 pg   MCHC 33.7 30.0 - 36.0 g/dL   RDW 12.8 11.5 - 15.5 %   Platelets 160 150 - 400 K/uL   nRBC 0.0 0.0 - 0.2 %    Comment: Performed at [facility], [address], [place], [place] [address]  Urine Drug Screen, Qualitative     Status: Abnormal   Collection Time: 08/26/22 11:06 AM  Result Value Ref Range   Tricyclic, Ur Screen NONE DETECTED NONE DETECTED   Amphetamines, Ur Screen NONE DETECTED NONE DETECTED   MDMA (Ecstasy)Ur Screen NONE DETECTED NONE DETECTED   Cocaine Metabolite,Ur [place] POSITIVE (A) NONE DETECTED   Opiate, Ur Screen NONE DETECTED NONE DETECTED   Phencyclidine (PCP) Ur S NONE DETECTED NONE DETECTED   Cannabinoid 50 Ng, Ur  POSITIVE (A) NONE DETECTED   Barbiturates, Ur Screen NONE DETECTED NONE DETECTED   Benzodiazepine, Ur Scrn NONE DETECTED NONE DETECTED   Methadone Scn, Ur NONE DETECTED NONE DETECTED    Comment: (NOTE) Tricyclics + metabolites, urine    Cutoff 1000  ng/mL Amphetamines + metabolites, urine  Cutoff 1000 ng/mL MDMA (Ecstasy), urine              Cutoff 500 ng/mL Cocaine Metabolite, urine          Cutoff 300 ng/mL Opiate + metabolites, urine        Cutoff 300 ng/mL Phencyclidine (PCP), urine         Cutoff 25 ng/mL Cannabinoid, urine                 Cutoff 50 ng/mL Barbiturates + metabolites, urine  Cutoff 200 ng/mL Benzodiazepine, urine              Cutoff 200 ng/mL Methadone, urine                   Cutoff 300 ng/mL  The urine drug screen provides only a preliminary, unconfirmed analytical test result and should not be used for non-medical purposes. Clinical consideration and professional judgment should be applied to any positive drug screen result due to possible interfering substances. A more specific alternate chemical method must be used in order to obtain a confirmed analytical result. Gas chromatography / mass spectrometry (GC/MS) is the preferred confirm atory method. Performed at [facility], [address], [place], [place] [address]     Current Facility-Administered Medications  Medication Dose Route Frequency Provider Last Rate Last Admin   LORazepam (ATIVAN) tablet 0.5 mg  0.5 mg Oral Q6H Merlyn Lot, MD       Or   LORazepam (ATIVAN) tablet 0-4 mg  0-4 mg Oral Q6H Merlyn Lot, MD       [START ON 08/28/2022] LORazepam (ATIVAN) tablet 0-4 mg  0-4 mg Oral Q12H Merlyn Lot, MD       thiamine (VITAMIN B1) tablet 100 mg  100 mg Oral Daily Merlyn Lot, MD   100 mg at 08/26/22 1149   Or   thiamine (VITAMIN B1) injection 100 mg  100 mg Intravenous Daily Merlyn Lot, MD       Current Outpatient Medications  Medication Sig Dispense Refill   loperamide (IMODIUM A-D) 2 MG tablet Take 1 tablet (2 mg total) by mouth 4 (four) times daily as needed for diarrhea or loose stools. 30 tablet 0   nicotine (NICODERM CQ - DOSED IN MG/24 HOURS) 14 mg/24hr patch Place 1 patch (14 mg total) onto the  skin daily. 14 patch 0   ARIPiprazole (ABILIFY) 10 MG tablet Take 1 tablet (10 mg total) by mouth daily. 30 tablet 0   ARIPiprazole ER (ABILIFY MAINTENA) 400 MG SRER injection Inject 2 mLs (400 mg total) into the muscle every 28 (twenty-eight) days. 1 each 1   clonazePAM (KLONOPIN) 1 MG disintegrating tablet Take 1 tablet (1 mg total) by mouth at bedtime. (Patient not taking: Reported on 08/26/2022) 30 tablet 1   divalproex (DEPAKOTE) 500 MG DR tablet Take 1 tablet (500 mg total) by mouth 2 (two) times daily. 60 tablet 0   ondansetron (ZOFRAN) 4 MG tablet Take 1 tablet (4 mg total) by mouth daily as needed for nausea or vomiting. 30 tablet 1    Musculoskeletal: Strength & Muscle Tone: within normal limits Gait & Station: normal Patient leans: N/A   Psychiatric Specialty Exam: Physical Exam Vitals and nursing note reviewed.  Constitutional:      Appearance: Normal appearance.  HENT:     Head: Normocephalic and atraumatic.     Nose: Nose normal.  Pulmonary:     Effort: Pulmonary effort is normal.  Musculoskeletal:        General: Normal range of motion.     Cervical back: Normal range of motion.  Neurological:     General: No focal deficit present.     Mental Status: He is alert and oriented to person, place, and time.  Psychiatric:        Attention and Perception: Attention and perception normal.        Mood and Affect: Mood is depressed. Affect is flat and tearful.        Speech: Speech normal.        Behavior: Behavior is cooperative.        Thought Content: Thought content includes suicidal ideation. Thought content does not include homicidal ideation. Thought content includes suicidal plan.        Cognition and Memory: Cognition and memory normal.        Judgment: Judgment is inappropriate.     Review of Systems  Blood pressure (!) 135/98, pulse (!) 59, temperature 98 F (36.7 C), temperature source Oral, resp. rate 16, height '6\' 2"'$  (1.88 m), weight 113.4 kg, SpO2 100  %.Body mass index is 32.1 kg/m.  General Appearance: Casual  Eye Contact:  Fair  Speech:  Clear and Coherent  Volume:  Normal  Mood:  Depressed, Hopeless, and Worthless  Affect:  Depressed, Flat, and Tearful  Thought Process:  Coherent  Orientation:  Full (Time, Place, and Person)  Thought Content:  WDL  Suicidal Thoughts:  Yes.  with intent/plan to slit wrists  Homicidal Thoughts:  No  Memory:  Immediate;   Fair  Judgement:  Impaired  Insight:  Fair  Psychomotor Activity:  Normal  Concentration:  Concentration: Fair  Recall:  [employer] of Knowledge:  Fair  Language:  Good  Akathisia:  Negative  Handed:  Right  AIMS (if indicated):     Assets:  Communication Skills Desire for Improvement Financial Resources/Insurance Resilience Social Support  ADL's:  Intact  Cognition:  WNL  Sleep:   Poor      Physical Exam: Physical Exam Vitals and nursing note reviewed.  Constitutional:      Appearance: Normal appearance.  HENT:     Head: Normocephalic and atraumatic.     Nose: Nose normal.  Pulmonary:     Effort: Pulmonary effort is normal.  Musculoskeletal:        General: Normal range of motion.     Cervical back: Normal range of motion.  Neurological:     General: No focal deficit present.     Mental Status: He is alert and oriented to person, place, and time.  Psychiatric:        Attention and Perception: Attention and perception normal.        Mood and Affect: Mood is depressed. Affect is flat and tearful.        Speech: Speech normal.        Behavior: Behavior is cooperative.        Thought Content: Thought content includes suicidal ideation. Thought content does not include homicidal ideation. Thought content includes suicidal plan.        Cognition and Memory: Cognition and memory normal.        Judgment: Judgment is inappropriate.    ROS Blood pressure (!) 135/98, pulse (!) 59, temperature 98 F (36.7 C), temperature source Oral, resp. rate 16, height '6\' 2"'$   (1.88 m), weight 113.4 kg, SpO2 100 %. Body mass index is 32.1 kg/m.  Treatment Plan Summary: Daily contact with patient to assess and evaluate symptoms and progress in treatment, Medication management, and Plan recommend inpatient psychiatric admission for mood stabilization and safety, reestablish medication regimen with Abilify and Depakote, provide resources for substance abuse treatment, for family involvement in care and medication management upon mother's return.  Reviewed with Dr. Jari Pigg.  Disposition:  Recommend psychiatric Inpatient admission when medically cleared. Supportive therapy provided about ongoing stressors.  Ronny Flurry, NP 08/26/2022 4:48 PM

## 2022-08-26 NOTE — ED Notes (Signed)
Pt using phone to call RTS.

## 2022-08-27 ENCOUNTER — Encounter (HOSPITAL_COMMUNITY): Payer: Self-pay

## 2022-08-27 ENCOUNTER — Other Ambulatory Visit (HOSPITAL_COMMUNITY): Payer: Self-pay

## 2022-08-27 ENCOUNTER — Other Ambulatory Visit: Payer: Self-pay

## 2022-08-27 DIAGNOSIS — R45851 Suicidal ideations: Secondary | ICD-10-CM | POA: Diagnosis not present

## 2022-08-27 MED ORDER — INFLUENZA VAC SPLIT QUAD 0.5 ML IM SUSY
0.5000 mL | PREFILLED_SYRINGE | INTRAMUSCULAR | Status: AC
  Administered 2022-09-01: 0.5 mL via INTRAMUSCULAR
  Filled 2022-08-27: qty 0.5
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
## 2022-08-27 NOTE — Plan of Care (Signed)
  Problem: Education: Goal: Knowledge of [facility] Education information/materials will improve Outcome: Progressing Goal: Emotional status will improve Outcome: Progressing Goal: Mental status will improve Outcome: Progressing Goal: Verbalization of understanding the information provided will improve Outcome: Progressing   Problem: Coping: Goal: Ability to verbalize frustrations and anger appropriately will improve Outcome: Progressing   Problem: Safety: Goal: Periods of time without injury will increase Outcome: Progressing   Problem: Coping: Goal: Coping ability will improve Outcome: Progressing
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
## 2022-08-27 NOTE — BHH Suicide Risk Assessment (Signed)
[place] INPATIENT:  Family/Significant Other Suicide Prevention Education  Suicide Prevention Education:  Education Completed; Orville Greth ([phone]) [id number] (Mother) has been identified by the patient as the family member/significant other with whom the patient will be residing, and identified as the person(s) who will aid the patient in the event of a mental health crisis (suicidal ideations/suicide attempt).  With written consent from the patient, the family member/significant other has been provided the following suicide prevention education, prior to the and/or following the discharge of the patient.  The suicide prevention education provided includes the following: Suicide risk factors Suicide prevention and interventions National Suicide Hotline telephone number [facility] assessment telephone number [facility] Emergency Assistance [place] and/or Residential Mobile Crisis Unit telephone number  Request made of family/significant other to: Remove weapons (e.g., guns, rifles, knives), all items previously/currently identified as safety concern.   Remove drugs/medications (over-the-counter, prescriptions, illicit drugs), all items previously/currently identified as a safety concern.  The family member/significant other verbalizes understanding of the suicide prevention education information provided.  The family member/significant other agrees to remove the items of safety concern listed above.  CSW spoke with Mrs. Caballeros who states that her son can live with her after discharge.  She states that she will be able to accept him at her home on 09/04/2022.  She states that she would like him to be placed back on his Klonopin and receive an injection of Abilify instead of the pill.  She states that she will be seeking Guardianship and will be managing his money and medications for him.  Mrs. Waitkus also confirms that there are no firearms or weapons in the home.  CSW  completed SPE with Mrs. Hopke.   Darleen Crocker 08/27/2022, 3:36 PM

## 2022-08-27 NOTE — H&P (Signed)
Psychiatric Admission Assessment Adult  Patient Identification: Cody Young. MRN:  [id number] Date of Evaluation:  08/27/2022 Chief Complaint:  Suicidal ideation [R45.851] Principal Diagnosis: Suicidal ideation Diagnosis:  Principal Problem:   Suicidal ideation  Identifying information and reason for admission: The patient is a 32 year old Caucasian male who was admitted through the ED on a voluntary basis with symptoms of depression and suicidal ideations.  Apparently had relapsed on alcohol and cocaine for the last 3 to 4 months.  He was having persistent thoughts of wanting to cut his wrist and was also having some command hallucinations for which she went to the ED on a voluntary basis.  From the [facility] ED the patient was sent to [facility] behavioral health for further treatment. History of present illness: The patient reports that he has been using drugs including alcohol, crack cocaine and marijuana and was actually living in a crack house with people who use drugs and claimed that he did not want to be there.  He reports some thoughts of wanting to slit his wrists.  He also reported that he had stopped taking all his medications for bipolar disorder.  He was admitted to the [facility] in December 2023 but did not follow-up with outpatient treatment upon discharge.  He was not happy with his living arrangement anymore and endorsed the fact that he would try to hurt himself if he cannot go to the hospital.  He was voluntarily admitted for further treatment. Since admission to this facility the patient has been alertOriented and cooperative but has been focused on his medications.  He did report that he was taking Depakote, Abilify and Cymbalta but was more focused on benzodiazepines and asked if Ativan or Klonopin can be restarted. Past psychiatric history: By self-report patient has multiple hospitalizations both in [place] and here in [place].  His most recent hospitalization was in December  2023 to [facility]. Alcohol and substance abuse history: Patient gives an extensive history of alcohol and substance abuse.  He has been through detox in the past but denies going to rehab. Family and social history: The patient reports that he is originally from [place].  His father is deceased.  His mother lives in [place] and he was living with his aunt in [place] but recently moved out of that house and has been staying in a basement of a house.  People use drugs.  He is single and has no children has 1/9 grade education and is on Social Security disability. Mental status examination: Patient is alert oriented and cooperative.  He maintained fair to good eye contact.  His speech was coherent without any obvious looseness of associations or flight of ideas.  He endorses depression, anxiety and passive suicidal ideations with a plan to cut his wrists.  He also reports that he has been abusing alcohol and drugs for over 3 months he will since his discharge from [facility].  He admits to vague hallucinations and claims that he hears his dead father's voice he also admits to vague paranoia related to and in the context of drugs.  He is cognitively intact.  He is able to contract for safety. Associated Signs/Symptoms: Depression Symptoms:  depressed mood, psychomotor retardation, fatigue, hopelessness, suicidal thoughts with specific plan, anxiety, (Hypo) Manic Symptoms:  Impulsivity, Anxiety Symptoms:  Excessive Worry, Psychotic Symptoms:  Delusions, Hallucinations: Auditory Command:  He claims that he hears his father's voice and the voices telling him to hurt himself. PTSD Symptoms: Negative Total Time  spent with patient: 30 minutes  Past Psychiatric History: Multiple hospitalizations in the past.  Most recent hospitalization to [facility] in December 2023  Is the patient at risk to self? Yes.    Has the patient been a risk to self in the past 6 months? Yes.    Has the patient been a risk to self  within the distant past? Yes.    Is the patient a risk to others? No.  Has the patient been a risk to others in the past 6 months? No.  Has the patient been a risk to others within the distant past? No.   [place] Scale:  [facility] Admission (Current) from 08/26/2022 in [facility] Most recent reading at 08/26/2022  8:49 PM ED from 08/26/2022 in [facility] Emergency Department at [facility] Most recent reading at 08/26/2022 11:02 AM ED from 08/25/2022 in [facility] Emergency Department at [facility] Most recent reading at 08/25/2022  8:50 PM  C-SSRS RISK CATEGORY Low Risk Low Risk No Risk        Prior Inpatient Therapy: Yes.   If yes, describe [facility]   Prior Outpatient Therapy: No. If yes, describe Non-compliant   Alcohol Screening: 1. How often do you have a drink containing alcohol?: 2 to 3 times a week 2. How many drinks containing alcohol do you have on a typical day when you are drinking?: 10 or more 3. How often do you have six or more drinks on one occasion?: Weekly AUDIT-C Score: 10 4. How often during the last year have you found that you were not able to stop drinking once you had started?: Weekly 5. How often during the last year have you failed to do what was normally expected from you because of drinking?: Weekly 6. How often during the last year have you needed a first drink in the morning to get yourself going after a heavy drinking session?: Weekly 7. How often during the last year have you had a feeling of guilt of remorse after drinking?: Weekly 8. How often during the last year have you been unable to remember what happened the night before because you had been drinking?: Weekly 9. Have you or someone else been injured as a result of your drinking?: Yes, during the last year 10. Has a relative or friend or a doctor or another health worker been concerned about your drinking or suggested you cut down?: Yes, but not in the last  year Alcohol Use Disorder Identification Test Final Score (AUDIT): 31 Alcohol Brief Interventions/Follow-up: Alcohol education/Brief advice Substance Abuse History in the last 12 months:  Yes.   Consequences of Substance Abuse: Family Consequences:  Unable to stay with mother or his aunt due to substance abuse. Withdrawal Symptoms:   Cramps Headaches Tremors Previous Psychotropic Medications: Yes  Psychological Evaluations: No  Past Medical History:  Past Medical History:  Diagnosis Date   Bipolar 1 disorder ([place])     Past Surgical History:  Procedure Laterality Date   BRAIN SURGERY     Family History: History reviewed. No pertinent family history. Family Psychiatric  History: Unknown at this time Tobacco Screening:  Social History   Tobacco Use  Smoking Status Every Day   Packs/day: 1.00   Years: 20.00   Total pack years: 20.00   Types: Cigarettes  Smokeless Tobacco Never    BH Tobacco Counseling     Are you interested in Tobacco Cessation Medications?  Yes, implement Nicotene Replacement Protocol Counseled  patient on smoking cessation:  Refused/Declined practical counseling Reason Tobacco Screening Not Completed: Patient Refused Screening       Social History:  Social History   Substance and Sexual Activity  Alcohol Use Yes   Alcohol/week: 12.0 standard drinks of alcohol   Types: 12 Cans of beer per week   Comment: 12 beers per day     Social History   Substance and Sexual Activity  Drug Use Yes   Types: Marijuana, Cocaine, "Crack" cocaine   Comment: last use cocaine was 08/25/22    Additional Social History: Marital status: Single Are you sexually active?: Yes What is your sexual orientation?: Heterosexual Has your sexual activity been affected by drugs, alcohol, medication, or emotional stress?: N/A Does patient have children?: No                         Allergies:  No Known Allergies Lab Results:  Results for orders placed or performed  during the hospital encounter of 08/26/22 (from the past 48 hour(s))  Comprehensive metabolic panel     Status: None   Collection Time: 08/26/22 11:06 AM  Result Value Ref Range   Sodium 138 135 - 145 mmol/L   Potassium 3.7 3.5 - 5.1 mmol/L   Chloride 104 98 - 111 mmol/L   CO2 28 22 - 32 mmol/L   Glucose, Bld 91 70 - 99 mg/dL    Comment: Glucose reference range applies only to samples taken after fasting for at least 8 hours.   BUN 14 6 - 20 mg/dL   Creatinine, Ser 0.95 0.61 - 1.24 mg/dL   Calcium 9.2 8.9 - 10.3 mg/dL   Total Protein 6.8 6.5 - 8.1 g/dL   Albumin 4.0 3.5 - 5.0 g/dL   AST 15 15 - 41 U/L   ALT 13 0 - 44 U/L   Alkaline Phosphatase 54 38 - 126 U/L   Total Bilirubin 0.8 0.3 - 1.2 mg/dL   GFR, Estimated >60 >60 mL/min    Comment: (NOTE) Calculated using the CKD-EPI Creatinine Equation (2021)    Anion gap 6 5 - 15    Comment: Performed at [facility], [address] [place], [place] [address]  Ethanol     Status: None   Collection Time: 08/26/22 11:06 AM  Result Value Ref Range   Alcohol, Ethyl (B) <10 <10 mg/dL    Comment: (NOTE) Lowest detectable limit for serum alcohol is 10 mg/dL.  For medical purposes only. Performed at [facility], [address] [place], [place] [id number]   Salicylate level     Status: Abnormal   Collection Time: 08/26/22 11:06 AM  Result Value Ref Range   Salicylate Lvl [id number] (L) 7.0 - 30.0 mg/dL    Comment: Performed at [facility], [address] [place], [place] [address]  Acetaminophen level     Status: Abnormal   Collection Time: 08/26/22 11:06 AM  Result Value Ref Range   Acetaminophen (Tylenol), Serum <10 (L) 10 - 30 ug/mL    Comment: (NOTE) Therapeutic concentrations vary significantly. A range of 10-30 ug/mL  may be an effective concentration for many patients. However, some  are best treated at concentrations outside of this range. Acetaminophen concentrations >150 ug/mL at 4 hours after  ingestion  and >50 ug/mL at 12 hours after ingestion are often associated with  toxic reactions.  Performed at [facility], [address], [place], [place] [address]   cbc  Status: None   Collection Time: 08/26/22 11:06 AM  Result Value Ref Range   WBC 6.6 4.0 - 10.5 K/uL   RBC 5.31 4.22 - 5.81 MIL/uL   Hemoglobin 16.9 13.0 - 17.0 g/dL   HCT 50.1 39.0 - 52.0 %   MCV 94.4 80.0 - 100.0 fL   MCH 31.8 26.0 - 34.0 pg   MCHC 33.7 30.0 - 36.0 g/dL   RDW 12.8 11.5 - 15.5 %   Platelets 160 150 - 400 K/uL   nRBC 0.0 0.0 - 0.2 %    Comment: Performed at [facility], [address] [place], [place] [address]  Urine Drug Screen, Qualitative     Status: Abnormal   Collection Time: 08/26/22 11:06 AM  Result Value Ref Range   Tricyclic, Ur Screen NONE DETECTED NONE DETECTED   Amphetamines, Ur Screen NONE DETECTED NONE DETECTED   MDMA (Ecstasy)Ur Screen NONE DETECTED NONE DETECTED   Cocaine Metabolite,Ur [place] POSITIVE (A) NONE DETECTED   Opiate, Ur Screen NONE DETECTED NONE DETECTED   Phencyclidine (PCP) Ur S NONE DETECTED NONE DETECTED   Cannabinoid 50 Ng, Ur  POSITIVE (A) NONE DETECTED   Barbiturates, Ur Screen NONE DETECTED NONE DETECTED   Benzodiazepine, Ur Scrn NONE DETECTED NONE DETECTED   Methadone Scn, Ur NONE DETECTED NONE DETECTED    Comment: (NOTE) Tricyclics + metabolites, urine    Cutoff 1000 ng/mL Amphetamines + metabolites, urine  Cutoff 1000 ng/mL MDMA (Ecstasy), urine              Cutoff 500 ng/mL Cocaine Metabolite, urine          Cutoff 300 ng/mL Opiate + metabolites, urine        Cutoff 300 ng/mL Phencyclidine (PCP), urine         Cutoff 25 ng/mL Cannabinoid, urine                 Cutoff 50 ng/mL Barbiturates + metabolites, urine  Cutoff 200 ng/mL Benzodiazepine, urine              Cutoff 200 ng/mL Methadone, urine                   Cutoff 300 ng/mL  The urine drug screen provides only a preliminary, unconfirmed analytical test result and  should not be used for non-medical purposes. Clinical consideration and professional judgment should be applied to any positive drug screen result due to possible interfering substances. A more specific alternate chemical method must be used in order to obtain a confirmed analytical result. Gas chromatography / mass spectrometry (GC/MS) is the preferred confirm atory method. Performed at [facility], [address] [place], [place] [address]   Resp panel by RT-PCR (RSV, Flu A&B, Covid) Anterior Nasal Swab     Status: None   Collection Time: 08/26/22  5:00 PM   Specimen: Anterior Nasal Swab  Result Value Ref Range   SARS Coronavirus 2 by RT PCR NEGATIVE NEGATIVE    Comment: (NOTE) SARS-CoV-2 target nucleic acids are NOT DETECTED.  The SARS-CoV-2 RNA is generally detectable in upper respiratory specimens during the acute phase of infection. The lowest concentration of SARS-CoV-2 viral copies this assay can detect is 138 copies/mL. A negative result does not preclude SARS-Cov-2 infection and should not be used as the sole basis for treatment or other patient management decisions. A negative result may occur with  improper specimen collection/handling, submission of specimen other than nasopharyngeal swab, presence of viral mutation(s) within  the areas targeted by this assay, and inadequate number of viral copies(<138 copies/mL). A negative result must be combined with clinical observations, patient history, and epidemiological information. The expected result is Negative.  Fact Sheet for Patients:  EntrepreneurPulse.com.au  Fact Sheet for Healthcare Providers:  IncredibleEmployment.be  This test is no t yet approved or cleared by the [place] FDA and  has been authorized for detection and/or diagnosis of SARS-CoV-2 by FDA under an Emergency Use Authorization (EUA). This EUA will remain  in effect (meaning this test can be used)  for the duration of the COVID-19 declaration under Section 564(b)(1) of the Act, 21 U.S.C.section 360bbb-3(b)(1), unless the authorization is terminated  or revoked sooner.       Influenza A by PCR NEGATIVE NEGATIVE   Influenza B by PCR NEGATIVE NEGATIVE    Comment: (NOTE) The Xpert Xpress SARS-CoV-2/FLU/RSV plus assay is intended as an aid in the diagnosis of influenza from Nasopharyngeal swab specimens and should not be used as a sole basis for treatment. Nasal washings and aspirates are unacceptable for Xpert Xpress SARS-CoV-2/FLU/RSV testing.  Fact Sheet for Patients: EntrepreneurPulse.com.au  Fact Sheet for Healthcare Providers: IncredibleEmployment.be  This test is not yet approved or cleared by the [place] FDA and has been authorized for detection and/or diagnosis of SARS-CoV-2 by FDA under an Emergency Use Authorization (EUA). This EUA will remain in effect (meaning this test can be used) for the duration of the COVID-19 declaration under Section 564(b)(1) of the Act, 21 U.S.C. section 360bbb-3(b)(1), unless the authorization is terminated or revoked.     Resp Syncytial Virus by PCR NEGATIVE NEGATIVE    Comment: (NOTE) Fact Sheet for Patients: EntrepreneurPulse.com.au  Fact Sheet for Healthcare Providers: IncredibleEmployment.be  This test is not yet approved or cleared by the [place] FDA and has been authorized for detection and/or diagnosis of SARS-CoV-2 by FDA under an Emergency Use Authorization (EUA). This EUA will remain in effect (meaning this test can be used) for the duration of the COVID-19 declaration under Section 564(b)(1) of the Act, 21 U.S.C. section 360bbb-3(b)(1), unless the authorization is terminated or revoked.  Performed at [facility], [address] [place], [place] [address]     Blood Alcohol level:  Lab Results  Component Value Date    [facility] <10 08/26/2022   ETH <10 [id number]    Metabolic Disorder Labs:  No results found for: "HGBA1C", "MPG" No results found for: "PROLACTIN" No results found for: "CHOL", "TRIG", "HDL", "CHOLHDL", "VLDL", "LDLCALC"  Current Medications: Current Facility-Administered Medications  Medication Dose Route Frequency Provider Last Rate Last Admin   acetaminophen (TYLENOL) tablet 650 mg  650 mg Oral Q6H PRN Bennett, Christal H, NP   650 mg at 08/27/22 1009   alum & mag hydroxide-simeth (MAALOX/MYLANTA) 200-200-20 MG/5ML suspension 30 mL  30 mL Oral Q4H PRN Bennett, Christal H, NP       ARIPiprazole (ABILIFY) tablet 10 mg  10 mg Oral Daily Bennett, Christal H, NP   10 mg at 08/27/22 1006   diphenhydrAMINE (BENADRYL) capsule 50 mg  50 mg Oral BID PRN Bennett, Christal H, NP       Or   diphenhydrAMINE (BENADRYL) injection 50 mg  50 mg Intramuscular BID PRN Bennett, Christal H, NP       divalproex (DEPAKOTE) DR tablet 500 mg  500 mg Oral BID Bennett, Christal H, NP   500 mg at 08/27/22 1006   haloperidol (HALDOL) tablet 5 mg  5 mg Oral BID  PRN Richardson Landry, Christal H, NP       Or   haloperidol lactate (HALDOL) injection 5 mg  5 mg Intramuscular BID PRN Bennett, Christal H, NP       hydrOXYzine (ATARAX) tablet 25 mg  25 mg Oral NOW Evette Georges, NP       hydrOXYzine (ATARAX) tablet 25 mg  25 mg Oral Q6H PRN Evette Georges, NP       [START ON 08/28/2022] influenza vac split quadrivalent PF (FLUARIX) injection 0.5 mL  0.5 mL Intramuscular Tomorrow-1000 Evette Georges, NP       loperamide (IMODIUM) capsule 2-4 mg  2-4 mg Oral PRN Evette Georges, NP       LORazepam (ATIVAN) tablet 2 mg  2 mg Oral BID PRN Bennett, Christal H, NP       Or   LORazepam (ATIVAN) injection 2 mg  2 mg Intramuscular BID PRN Bennett, Christal H, NP       LORazepam (ATIVAN) tablet 1 mg  1 mg Oral Q6H PRN Evette Georges, NP   1 mg at 08/27/22 1006   magnesium hydroxide (MILK OF MAGNESIA) suspension 30 mL  30 mL Oral Daily PRN Richardson Landry,  Christal H, NP       multivitamin with minerals tablet 1 tablet  1 tablet Oral Daily Evette Georges, NP   1 tablet at 08/27/22 1006   ondansetron (ZOFRAN-ODT) disintegrating tablet 4 mg  4 mg Oral Q6H PRN Evette Georges, NP       thiamine (Vitamin B-1) tablet 100 mg  100 mg Oral Daily Evette Georges, NP   100 mg at 08/27/22 1006   traZODone (DESYREL) tablet 100 mg  100 mg Oral QHS PRN Evette Georges, NP       PTA Medications: Medications Prior to Admission  Medication Sig Dispense Refill Last Dose   ARIPiprazole (ABILIFY) 10 MG tablet Take 1 tablet (10 mg total) by mouth daily. 30 tablet 0    ARIPiprazole ER (ABILIFY MAINTENA) 400 MG SRER injection Inject 2 mLs (400 mg total) into the muscle every 28 (twenty-eight) days. 1 each 1    clonazePAM (KLONOPIN) 1 MG disintegrating tablet Take 1 tablet (1 mg total) by mouth at bedtime. (Patient not taking: Reported on 08/26/2022) 30 tablet 1    divalproex (DEPAKOTE) 500 MG DR tablet Take 1 tablet (500 mg total) by mouth 2 (two) times daily. 60 tablet 0    loperamide (IMODIUM A-D) 2 MG tablet Take 1 tablet (2 mg total) by mouth 4 (four) times daily as needed for diarrhea or loose stools. 30 tablet 0    nicotine (NICODERM CQ - DOSED IN MG/24 HOURS) 14 mg/24hr patch Place 1 patch (14 mg total) onto the skin daily. 14 patch 0    ondansetron (ZOFRAN) 4 MG tablet Take 1 tablet (4 mg total) by mouth daily as needed for nausea or vomiting. 30 tablet 1     Musculoskeletal: Strength & Muscle Tone: within normal limits Gait & Station: normal Patient leans: N/A            Psychiatric Specialty Exam:  Presentation  General Appearance:  Appropriate for Environment; Disheveled  Eye Contact: Fair  Speech: Clear and Coherent  Speech Volume: Decreased  Handedness: Right   Mood and Affect  Mood: Anxious; Depressed  Affect: Restricted; Congruent   Thought Process  Thought Processes: Linear  Duration of Psychotic Symptoms:N/A Past  Diagnosis of Schizophrenia or Psychoactive disorder: No  Descriptions of Associations:Intact  Orientation:Full (Time, Place and Person)  Thought Content:Perseveration; Paranoid Ideation; Rumination  Hallucinations:Hallucinations: Command Description of Command Hallucinations: He claims he hears his dead father's voice.  Ideas of Reference:Paranoia  Suicidal Thoughts:Suicidal Thoughts: Yes, Passive SI Passive Intent and/or Plan: With Intent; With Plan  Homicidal Thoughts:Homicidal Thoughts: No   Sensorium  Memory: Immediate Fair; Remote Fair; Recent Fair  Judgment: Poor  Insight: Fair   Materials engineer: Fair  Attention Span: Fair  Recall: [employer] of Knowledge: Fair  Language: Fair   Psychomotor Activity  Psychomotor Activity: Psychomotor Activity: Normal   Assets  Assets: Desire for Improvement; Social Support; Communication Skills   Sleep  Sleep: Sleep: Fair    Physical Exam: Physical Exam Vitals and nursing note reviewed.  Constitutional:      Appearance: Normal appearance.  Neurological:     General: No focal deficit present.     Mental Status: He is alert and oriented to person, place, and time. Mental status is at baseline.  Psychiatric:        Thought Content: Thought content normal.        Judgment: Judgment normal.    Review of Systems  Psychiatric/Behavioral:  Positive for depression, hallucinations, substance abuse and suicidal ideas.   All other systems reviewed and are negative.  Blood pressure 112/76, pulse 72, temperature 97.7 F (36.5 C), resp. rate 16, height '6\' 2"'$  (1.88 m), weight 106 kg, SpO2 99 %. Body mass index is 29.99 kg/m.  Treatment Plan Summary: Daily contact with patient to assess and evaluate symptoms and progress in treatment and Medication management  Observation Level/Precautions:  Detox Fall 15 minute checks  Laboratory:   Admission labs.  Psychotherapy:    Medications:     Consultations:    Discharge Concerns:    Estimated LOS:  Other:     Physician Treatment Plan for Primary Diagnosis: Suicidal ideation Long Term Goal(s): Improvement in symptoms so as ready for discharge  Short Term Goals: Ability to identify changes in lifestyle to reduce recurrence of condition will improve, Ability to disclose and discuss suicidal ideas, Ability to demonstrate self-control will improve, and Compliance with prescribed medications will improve  Physician Treatment Plan for Secondary Diagnosis: Principal Problem:   Suicidal ideation  Long Term Goal(s): Improvement in symptoms so as ready for discharge  Short Term Goals: Ability to identify changes in lifestyle to reduce recurrence of condition will improve, Ability to disclose and discuss suicidal ideas, Ability to identify and develop effective coping behaviors will improve, and Compliance with prescribed medications will improve   PLAN: Safety and Monitoring:             --  Voluntary admission to inpatient psychiatric unit for safety, stabilization and treatment             -- Daily contact with patient to assess and evaluate symptoms and progress in treatment             -- Patient's case to be discussed in multi-disciplinary team meeting             -- Observation Level : q15 minute checks             -- Vital signs:  q12 hours             -- Precautions: suicide, elopement, and assault   2. Psychiatric Diagnoses and Treatment:              The patient was restarted on admission on the following medications: Abilify 10 mg a  day Depakote ER 500 mg twice a day Hydroxyzine 25 mg as needed He is on the CIWA protocol. Patient claims that he was on Cymbalta but not clear whether it was effective.  Will continue to explore this further. --The risks/benefits/side-effects/alternatives to this medication were discussed in detail with the patient and time was given for questions. The patient consents to medication trial.   -- FDA             -- Metabolic profile and EKG monitoring obtained while on an atypical antipsychotic (BMI: Lipid Panel: HbgA1c: QTc:) as indicated             -- Encouraged patient to participate in unit milieu and in scheduled group therapies              -- Short Term Goals: Ability to disclose and discuss suicidal ideas, Ability to demonstrate self-control will improve, and Ability to identify and develop effective coping behaviors will improve             -- Long Term Goals: Improvement in symptoms so as ready for discharge                3. Medical Issues Being Addressed:              Tobacco Use Disorder             -- Nicotine patch '21mg'$ /24 hours ordered             -- Smoking cessation encouraged   4. Discharge Planning:              -- Social work and case management to assist with discharge planning and identification of hospital follow-up needs prior to discharge             -- Estimated LOS: 5-7 days             -- Discharge Concerns: Need to establish a safety plan; Medication compliance and effectiveness             -- Discharge Goals: Return home with outpatient referrals for mental health follow-up including medication management/psychotherapy   I certify that inpatient services furnished can reasonably be expected to improve the patient's condition.    Ranae Palms, MD 3/7/20243:42 PM Total Time Spent in Direct Patient Care:  I personally spent 30 minutes on the unit in direct patient care. The direct patient care time included face-to-face time with the patient, reviewing the patient's chart, communicating with other professionals, and coordinating care. Greater than 50% of this time was spent in counseling or coordinating care with the patient regarding goals of hospitalization, psycho-education, and discharge planning needs.   [place] [facility]

## 2022-08-27 NOTE — Progress Notes (Signed)
   08/27/22 1000  Psych Admission Type (Psych Patients Only)  Admission Status Voluntary  Psychosocial Assessment  Patient Complaints Sadness  Eye Contact Brief  Facial Expression Sad  Affect Anxious;Depressed  Speech Logical/coherent  Interaction Assertive  Motor Activity Slow  Appearance/Hygiene In scrubs  Behavior Characteristics Cooperative  Mood Pleasant  Thought Process  Coherency Disorganized  Content WDL  Delusions Paranoid  Perception Hallucinations  Hallucination Visual  Judgment Poor  Confusion WDL  Danger to Self  Current suicidal ideation? Passive  Self-Injurious Behavior Some self-injurious ideation observed or expressed.  No lethal plan expressed   Agreement Not to Harm Self Yes  Description of Agreement verbally contracts for safety  Danger to Others  Danger to Others None reported or observed

## 2022-08-27 NOTE — Group Note (Signed)
LCSW Group Therapy Note   Group Date: 08/27/2022 Start Time: 1100 End Time: 1200  Type of Therapy and Topic:  Group Therapy:  Self-Care after Hospitalization  Participation Level:  Did Not Attend   Description of Group This process group involved patients discussing how they plan to take care of themselves in a better manner when they get home from the hospital.  The group started with patients listing one healthy self-care they hope to engage in at discharge that they did not use prior to admission.  We discussed a variety of other means of self-care which had a large range from hygiene activities to eating to setting boundaries to participating in peer support groups.  The primary focus by CSW was to point out commonalities.  When there were participants who stated everyone else can get help, but they themselves are "beyond help," this was discussed in detail and this belief was gently challenged.  Finally, it was announced that immediately following group was to be "Hygiene Hour" where everyone would go to their rooms and take care of their personal hygiene.  This was met with wide acceptance.  Therapeutic Goals Patient will identify and describe one self-care activity to deliberately plan to use upon hospital discharge. Patient will participate in generating additional ideas about healthy self-care options when they return to the community. Patients will be supportive of one another and receive support from others. Patients will be challenged to realize that they are not "beyond help" any more than other participants in the room are.  Summary of Patient Progress:  Did not attend    Therapeutic Modalities Brief Solution-Focused Therapy Psychoeducation  Darleen Crocker, LCSWA 08/27/2022  1:10 PM

## 2022-08-28 ENCOUNTER — Encounter (HOSPITAL_COMMUNITY): Payer: Self-pay

## 2022-08-28 DIAGNOSIS — R45851 Suicidal ideations: Secondary | ICD-10-CM | POA: Diagnosis not present

## 2022-08-28 MED ORDER — DULOXETINE HCL 30 MG PO CPEP
30.0000 mg | ORAL_CAPSULE | Freq: Every day | ORAL | Status: DC
  Administered 2022-08-28 – 2022-09-04 (×8): 30 mg via ORAL
  Filled 2022-08-28: qty 1
  Filled 2022-08-28: qty 7
  Filled 2022-08-28 (×5): qty 1
  Filled 2022-08-28: qty 7
  Filled 2022-08-28 (×2): qty 1

## 2022-08-28 NOTE — [facility] Group Notes (Signed)
[facility] Group Notes:  (Nursing/MHT/Case Management/Adjunct)  Date:  08/28/2022  Time:  8:20 PM  Type of Therapy:   AA  Participation Level:  Did Not Attend  Participation Quality:   na  Affect:   na  Cognitive:   n a  Insight:  None  Engagement in Group:   na  Modes of Intervention:   na  Summary of Progress/Problems: did not attend.  Orvan Falconer 08/28/2022, 8:20 PM

## 2022-08-28 NOTE — BH IP Treatment Plan (Signed)
Interdisciplinary Treatment and Diagnostic Plan New  08/28/2022 Time of Session: 1115 Cody Young. MRN: [id number]  Principal Diagnosis: Suicidal ideation  Secondary Diagnoses: Principal Problem:   Suicidal ideation   Current Medications:  Current Facility-Administered Medications  Medication Dose Route Frequency Provider Last Rate Last Admin   acetaminophen (TYLENOL) tablet 650 mg  650 mg Oral Q6H PRN Bennett, Christal H, NP   650 mg at 08/27/22 1009   alum & mag hydroxide-simeth (MAALOX/MYLANTA) 200-200-20 MG/5ML suspension 30 mL  30 mL Oral Q4H PRN Bennett, Christal H, NP       ARIPiprazole (ABILIFY) tablet 10 mg  10 mg Oral Daily Bennett, Christal H, NP   10 mg at 08/28/22 1019   divalproex (DEPAKOTE) DR tablet 500 mg  500 mg Oral BID Bennett, Christal H, NP   500 mg at 08/28/22 1019   DULoxetine (CYMBALTA) DR capsule 30 mg  30 mg Oral Daily Ranae Palms, MD       hydrOXYzine (ATARAX) tablet 25 mg  25 mg Oral Q6H PRN Evette Georges, NP   25 mg at 08/28/22 1021   influenza vac split quadrivalent PF (FLUARIX) injection 0.5 mL  0.5 mL Intramuscular Tomorrow-1000 Evette Georges, NP       loperamide (IMODIUM) capsule 2-4 mg  2-4 mg Oral PRN Evette Georges, NP       LORazepam (ATIVAN) tablet 1 mg  1 mg Oral Q6H PRN Evette Georges, NP   1 mg at 08/27/22 1006   magnesium hydroxide (MILK OF MAGNESIA) suspension 30 mL  30 mL Oral Daily PRN Bennett, Christal H, NP       multivitamin with minerals tablet 1 tablet  1 tablet Oral Daily Evette Georges, NP   1 tablet at 08/28/22 1019   ondansetron (ZOFRAN-ODT) disintegrating tablet 4 mg  4 mg Oral Q6H PRN Evette Georges, NP       thiamine (Vitamin B-1) tablet 100 mg  100 mg Oral Daily Evette Georges, NP   100 mg at 08/28/22 1020   traZODone (DESYREL) tablet 100 mg  100 mg Oral QHS PRN Evette Georges, NP   100 mg at 08/27/22 2144   PTA Medications: Medications Prior to Admission  Medication Sig Dispense Refill Last Dose   ARIPiprazole (ABILIFY)  10 MG tablet Take 1 tablet (10 mg total) by mouth daily. 30 tablet 0    ARIPiprazole ER (ABILIFY MAINTENA) 400 MG SRER injection Inject 2 mLs (400 mg total) into the muscle every 28 (twenty-eight) days. 1 each 1    clonazePAM (KLONOPIN) 1 MG disintegrating tablet Take 1 tablet (1 mg total) by mouth at bedtime. (Patient not taking: Reported on 08/26/2022) 30 tablet 1    divalproex (DEPAKOTE) 500 MG DR tablet Take 1 tablet (500 mg total) by mouth 2 (two) times daily. 60 tablet 0    loperamide (IMODIUM A-D) 2 MG tablet Take 1 tablet (2 mg total) by mouth 4 (four) times daily as needed for diarrhea or loose stools. 30 tablet 0    nicotine (NICODERM CQ - DOSED IN MG/24 HOURS) 14 mg/24hr patch Place 1 patch (14 mg total) onto the skin daily. 14 patch 0    ondansetron (ZOFRAN) 4 MG tablet Take 1 tablet (4 mg total) by mouth daily as needed for nausea or vomiting. 30 tablet 1     Patient Stressors: Health problems   Medication change or noncompliance   Substance abuse    Patient Strengths: Psychologist, clinical for treatment/growth  Treatment Modalities: Medication Management, Group therapy, Case management,  1 to 1 session with clinician, Psychoeducation, Recreational therapy.   Physician Treatment Plan for Primary Diagnosis: Suicidal ideation Long Term Goal(s): Improvement in symptoms so as ready for discharge   Short Term Goals: Ability to identify changes in lifestyle to reduce recurrence of condition will improve Ability to disclose and discuss suicidal ideas Ability to identify and develop effective coping behaviors will improve Compliance with prescribed medications will improve Ability to demonstrate self-control will improve  Medication Management: Evaluate patient's response, side effects, and tolerance of medication regimen.  Therapeutic Interventions: 1 to 1 sessions, Unit Group sessions and Medication administration.  Evaluation of Outcomes:  Progressing  Physician Treatment Plan for Secondary Diagnosis: Principal Problem:   Suicidal ideation  Long Term Goal(s): Improvement in symptoms so as ready for discharge   Short Term Goals: Ability to identify changes in lifestyle to reduce recurrence of condition will improve Ability to disclose and discuss suicidal ideas Ability to identify and develop effective coping behaviors will improve Compliance with prescribed medications will improve Ability to demonstrate self-control will improve     Medication Management: Evaluate patient's response, side effects, and tolerance of medication regimen.  Therapeutic Interventions: 1 to 1 sessions, Unit Group sessions and Medication administration.  Evaluation of Outcomes: Progressing   RN Treatment Plan for Primary Diagnosis: Suicidal ideation Long Term Goal(s): Knowledge of disease and therapeutic regimen to maintain health will improve  Short Term Goals: Ability to remain free from injury will improve, Ability to verbalize frustration and anger appropriately will improve, Ability to demonstrate self-control, Ability to participate in decision making will improve, Ability to verbalize feelings will improve, Ability to disclose and discuss suicidal ideas, Ability to identify and develop effective coping behaviors will improve, and Compliance with prescribed medications will improve  Medication Management: RN will administer medications as ordered by provider, will assess and evaluate patient's response and provide education to patient for prescribed medication. RN will report any adverse and/or side effects to prescribing provider.  Therapeutic Interventions: 1 on 1 counseling sessions, Psychoeducation, Medication administration, Evaluate responses to treatment, Monitor vital signs and CBGs as ordered, Perform/monitor CIWA, COWS, AIMS and Fall Risk screenings as ordered, Perform wound care treatments as ordered.  Evaluation of Outcomes:  Progressing   LCSW Treatment Plan for Primary Diagnosis: Suicidal ideation Long Term Goal(s): Safe transition to appropriate next level of care at discharge, Engage patient in therapeutic group addressing interpersonal concerns.  Short Term Goals: Engage patient in aftercare planning with referrals and resources, Increase social support, Increase ability to appropriately verbalize feelings, Increase emotional regulation, Facilitate acceptance of mental health diagnosis and concerns, Facilitate patient progression through stages of change regarding substance use diagnoses and concerns, Identify triggers associated with mental health/substance abuse issues, and Increase skills for wellness and recovery  Therapeutic Interventions: Assess for all discharge needs, 1 to 1 time with Social worker, Explore available resources and support systems, Assess for adequacy in community support network, Educate family and significant other(s) on suicide prevention, Complete Psychosocial Assessment, Interpersonal group therapy.  Evaluation of Outcomes: Progressing   Progress in Treatment: Attending groups: Yes. Participating in groups: Yes. Taking medication as prescribed: Yes. Toleration medication: Yes. Family/Significant other contact made: Yes, individual(s) contacted:  Nafees Steuer [phone]) [id number] (Mother) Patient understands diagnosis: Yes. Discussing patient identified problems/goals with staff: Yes. Medical problems stabilized or resolved: Yes. Denies suicidal/homicidal ideation: Yes. Issues/concerns per patient self-inventory: Yes. Other:   New problem(s) identified: No, Describe:  None Reported  New Short Term/Long Term Goal(s): ): medication stabilization, elimination of SI thoughts, development of comprehensive mental wellness plan.     Patient Goals:  Detox, Medication Stabilization  Discharge Plan or Barriers: Patient recently admitted. CSW will continue to follow and assess for  appropriate referrals and possible discharge planning.     Reason for Continuation of Hospitalization: Depression Hallucinations Medication stabilization Suicidal ideation  Estimated Length of Stay: 3-7 Days  Last [place] Suicide Severity Risk Score: [place] Admission (Current) from 08/26/2022 in [facility] Most recent reading at 08/26/2022  8:49 PM ED from 08/26/2022 in [facility] Emergency Department at [facility] Most recent reading at 08/26/2022 11:02 AM ED from 08/25/2022 in [facility] Emergency Department at [facility] Most recent reading at 08/25/2022  8:50 PM  C-SSRS RISK CATEGORY Low Risk Low Risk No Risk       Last PHQ 2/9 Scores:     No data to display          medication stabilization, elimination of SI thoughts, development of comprehensive mental wellness plan.   Scribe for Treatment Team: Windle Guard, LCSW 08/28/2022 2:54 PM

## 2022-08-28 NOTE — Plan of Care (Signed)
  Problem: Education: Goal: Verbalization of understanding the information provided will improve Outcome: Progressing   Problem: Activity: Goal: Sleeping patterns will improve Outcome: Progressing   

## 2022-08-28 NOTE — Progress Notes (Signed)
   08/28/22 0600  15 Minute Checks  Location Bedroom  Visual Appearance Calm  Behavior Composed  Sleep (Behavioral Health Patients Only)  Calculate sleep? (Click Yes once per 24 hr at 0600 safety check) Yes  Documented sleep last 24 hours .75

## 2022-08-28 NOTE — Progress Notes (Signed)
Pt reports Passive SI and says his depression and anxiety level are at a 10 out of 10 with 10 being the highest amount. Pt contracts for safety on the unit.   RN encouraged pt to come out of his room and attend groups.  Pt did not attend groups so far today, but he said he will go to the wrap up group this evening.  Pt's EKG completed and put in pt's chart.

## 2022-08-28 NOTE — Progress Notes (Signed)
Psychiatric Admission Assessment Adult  Patient Identification: Cody Young. MRN:  [id number] Date of Evaluation:  08/28/2022 Chief Complaint:  Suicidal ideation [R45.851] Principal Diagnosis: Suicidal ideation Diagnosis:  Principal Problem:   Suicidal ideation  Identifying information and reason for admission: The patient is a 32 year old Caucasian male who was admitted through the ED on a voluntary basis with symptoms of depression and suicidal ideations.  Apparently had relapsed on alcohol and cocaine for the last 3 to 4 months.  He was having persistent thoughts of wanting to cut his wrist and was also having some command hallucinations for which she went to the ED on a voluntary basis.  From the [facility] ED the patient was sent to [facility] behavioral health for further treatment.  Last 24 hrs: Patient was seen, chart reviewed and case discussed with the treatment team.  Staff reports compliance with medications and no behavioral issues overnight.  He remains depressed and endorses very poor sleep.  However he is denying any withdrawal symptoms.  He received Tylenol, hydroxyzine, lorazepam and trazodone as a as needed medication at night.  Subjective: The patient continues to focus on poor sleep although nursing staff has reported him sleeping about 7.5 hours.  The quality of sleep is poor.  Patient also endorses depression but no active suicidal ideations.  He does report that his mother is coming from [place] to help him either with placement or to take him back to [place].  He remains sad and depressed but denies agitation.  He is attending groups.  His focus is on getting Social Security disability and to get help for his substance abuse.  Mental status examination: Patient is alert oriented and cooperative.  He maintained fair to good eye contact.  His speech was coherent without any obvious looseness of associations or flight of ideas.  He endorses depression, anxiety but denies any  suicidal ideations today.  He denies any acute withdrawal symptoms.  Today he denied any SI/HI/AVH.  He is calm and intact. Associated Signs/Symptoms: Depression Symptoms:  depressed mood, psychomotor retardation, fatigue, hopelessness, suicidal thoughts with specific plan, anxiety, (Hypo) Manic Symptoms:  Impulsivity, Anxiety Symptoms:  Excessive Worry, Psychotic Symptoms:  Delusions, Hallucinations: Auditory Command:  He claims that he hears his father's voice and the voices telling him to hurt himself. PTSD Symptoms: Negative Total Time spent with patient: 30 minutes  Past Psychiatric History: Multiple hospitalizations in the past.  Most recent hospitalization to [facility] in December 2023  Is the patient at risk to self? Yes.    Has the patient been a risk to self in the past 6 months? Yes.    Has the patient been a risk to self within the distant past? Yes.    Is the patient a risk to others? No.  Has the patient been a risk to others in the past 6 months? No.  Has the patient been a risk to others within the distant past? No.   [place] Scale:  [place] Admission (Current) from 08/26/2022 in [place] [facility] Most recent reading at 08/26/2022  8:49 PM ED from 08/26/2022 in [facility] Emergency Department at [facility] Most recent reading at 08/26/2022 11:02 AM ED from 08/25/2022 in [facility] Emergency Department at [facility] Most recent reading at 08/25/2022  8:50 PM  C-SSRS RISK CATEGORY Low Risk Low Risk No Risk        Prior Inpatient Therapy: Yes.   If yes, describe [facility]   Prior  Outpatient Therapy: No. If yes, describe Non-compliant   Alcohol Screening: 1. How often do you have a drink containing alcohol?: 2 to 3 times a week 2. How many drinks containing alcohol do you have on a typical day when you are drinking?: 10 or more 3. How often do you have six or more drinks on one occasion?: Weekly AUDIT-C Score: 10 4. How often during  the last year have you found that you were not able to stop drinking once you had started?: Weekly 5. How often during the last year have you failed to do what was normally expected from you because of drinking?: Weekly 6. How often during the last year have you needed a first drink in the morning to get yourself going after a heavy drinking session?: Weekly 7. How often during the last year have you had a feeling of guilt of remorse after drinking?: Weekly 8. How often during the last year have you been unable to remember what happened the night before because you had been drinking?: Weekly 9. Have you or someone else been injured as a result of your drinking?: Yes, during the last year 10. Has a relative or friend or a doctor or another health worker been concerned about your drinking or suggested you cut down?: Yes, but not in the last year Alcohol Use Disorder Identification Test Final Score (AUDIT): 31 Alcohol Brief Interventions/Follow-up: Alcohol education/Brief advice Substance Abuse History in the last 12 months:  Yes.   Consequences of Substance Abuse: Family Consequences:  Unable to stay with mother or his aunt due to substance abuse. Withdrawal Symptoms:   Cramps Headaches Tremors Previous Psychotropic Medications: Yes  Psychological Evaluations: No  Past Medical History:  Past Medical History:  Diagnosis Date   Bipolar 1 disorder ([place])     Past Surgical History:  Procedure Laterality Date   BRAIN SURGERY     Family History: History reviewed. No pertinent family history. Family Psychiatric  History: Unknown at this time Tobacco Screening:  Social History   Tobacco Use  Smoking Status Every Day   Packs/day: 1.00   Years: 20.00   Total pack years: 20.00   Types: Cigarettes  Smokeless Tobacco Never    BH Tobacco Counseling     Are you interested in Tobacco Cessation Medications?  Yes, implement Nicotene Replacement Protocol Counseled patient on smoking cessation:   Refused/Declined practical counseling Reason Tobacco Screening Not Completed: Patient Refused Screening       Social History:  Social History   Substance and Sexual Activity  Alcohol Use Yes   Alcohol/week: 12.0 standard drinks of alcohol   Types: 12 Cans of beer per week   Comment: 12 beers per day     Social History   Substance and Sexual Activity  Drug Use Yes   Types: Marijuana, Cocaine, "Crack" cocaine   Comment: last use cocaine was 08/25/22    Additional Social History: Marital status: Single Are you sexually active?: Yes What is your sexual orientation?: Heterosexual Has your sexual activity been affected by drugs, alcohol, medication, or emotional stress?: N/A Does patient have children?: No                         Allergies:  No Known Allergies Lab Results:  Results for orders placed or performed during the hospital encounter of 08/26/22 (from the past 48 hour(s))  Resp panel by RT-PCR (RSV, Flu A&B, Covid) Anterior Nasal Swab     Status: None  Collection Time: 08/26/22  5:00 PM   Specimen: Anterior Nasal Swab  Result Value Ref Range   SARS Coronavirus 2 by RT PCR NEGATIVE NEGATIVE    Comment: (NOTE) SARS-CoV-2 target nucleic acids are NOT DETECTED.  The SARS-CoV-2 RNA is generally detectable in upper respiratory specimens during the acute phase of infection. The lowest concentration of SARS-CoV-2 viral copies this assay can detect is 138 copies/mL. A negative result does not preclude SARS-Cov-2 infection and should not be used as the sole basis for treatment or other patient management decisions. A negative result may occur with  improper specimen collection/handling, submission of specimen other than nasopharyngeal swab, presence of viral mutation(s) within the areas targeted by this assay, and inadequate number of viral copies(<138 copies/mL). A negative result must be combined with clinical observations, patient history, and  epidemiological information. The expected result is Negative.  Fact Sheet for Patients:  EntrepreneurPulse.com.au  Fact Sheet for Healthcare Providers:  IncredibleEmployment.be  This test is no t yet approved or cleared by the [place] FDA and  has been authorized for detection and/or diagnosis of SARS-CoV-2 by FDA under an Emergency Use Authorization (EUA). This EUA will remain  in effect (meaning this test can be used) for the duration of the COVID-19 declaration under Section 564(b)(1) of the Act, 21 U.S.C.section 360bbb-3(b)(1), unless the authorization is terminated  or revoked sooner.       Influenza A by PCR NEGATIVE NEGATIVE   Influenza B by PCR NEGATIVE NEGATIVE    Comment: (NOTE) The Xpert Xpress SARS-CoV-2/FLU/RSV plus assay is intended as an aid in the diagnosis of influenza from Nasopharyngeal swab specimens and should not be used as a sole basis for treatment. Nasal washings and aspirates are unacceptable for Xpert Xpress SARS-CoV-2/FLU/RSV testing.  Fact Sheet for Patients: EntrepreneurPulse.com.au  Fact Sheet for Healthcare Providers: IncredibleEmployment.be  This test is not yet approved or cleared by the [place] FDA and has been authorized for detection and/or diagnosis of SARS-CoV-2 by FDA under an Emergency Use Authorization (EUA). This EUA will remain in effect (meaning this test can be used) for the duration of the COVID-19 declaration under Section 564(b)(1) of the Act, 21 U.S.C. section 360bbb-3(b)(1), unless the authorization is terminated or revoked.     Resp Syncytial Virus by PCR NEGATIVE NEGATIVE    Comment: (NOTE) Fact Sheet for Patients: EntrepreneurPulse.com.au  Fact Sheet for Healthcare Providers: IncredibleEmployment.be  This test is not yet approved or cleared by the [place] FDA and has been authorized for  detection and/or diagnosis of SARS-CoV-2 by FDA under an Emergency Use Authorization (EUA). This EUA will remain in effect (meaning this test can be used) for the duration of the COVID-19 declaration under Section 564(b)(1) of the Act, 21 U.S.C. section 360bbb-3(b)(1), unless the authorization is terminated or revoked.  Performed at [facility], [address] [place], [place] [address]     Blood Alcohol level:  Lab Results  Component Value Date   [facility] <10 08/26/2022   ETH <10 [id number]    Metabolic Disorder Labs:  No results found for: "HGBA1C", "MPG" No results found for: "PROLACTIN" No results found for: "CHOL", "TRIG", "HDL", "CHOLHDL", "VLDL", "LDLCALC"  Current Medications: Current Facility-Administered Medications  Medication Dose Route Frequency Provider Last Rate Last Admin   acetaminophen (TYLENOL) tablet 650 mg  650 mg Oral Q6H PRN Bennett, Christal H, NP   650 mg at 08/27/22 1009   alum & mag hydroxide-simeth (MAALOX/MYLANTA) 200-200-20 MG/5ML suspension 30 mL  30  mL Oral Q4H PRN Bennett, Christal H, NP       ARIPiprazole (ABILIFY) tablet 10 mg  10 mg Oral Daily Bennett, Christal H, NP   10 mg at 08/28/22 1019   divalproex (DEPAKOTE) DR tablet 500 mg  500 mg Oral BID Bennett, Christal H, NP   500 mg at 08/28/22 1019   hydrOXYzine (ATARAX) tablet 25 mg  25 mg Oral Q6H PRN Evette Georges, NP   25 mg at 08/28/22 1021   influenza vac split quadrivalent PF (FLUARIX) injection 0.5 mL  0.5 mL Intramuscular Tomorrow-1000 Evette Georges, NP       loperamide (IMODIUM) capsule 2-4 mg  2-4 mg Oral PRN Evette Georges, NP       LORazepam (ATIVAN) tablet 1 mg  1 mg Oral Q6H PRN Evette Georges, NP   1 mg at 08/27/22 1006   magnesium hydroxide (MILK OF MAGNESIA) suspension 30 mL  30 mL Oral Daily PRN Richardson Landry, Christal H, NP       multivitamin with minerals tablet 1 tablet  1 tablet Oral Daily Evette Georges, NP   1 tablet at 08/28/22 1019   ondansetron (ZOFRAN-ODT)  disintegrating tablet 4 mg  4 mg Oral Q6H PRN Evette Georges, NP       thiamine (Vitamin B-1) tablet 100 mg  100 mg Oral Daily Evette Georges, NP   100 mg at 08/28/22 1020   traZODone (DESYREL) tablet 100 mg  100 mg Oral QHS PRN Evette Georges, NP   100 mg at 08/27/22 2144   PTA Medications: Medications Prior to Admission  Medication Sig Dispense Refill Last Dose   ARIPiprazole (ABILIFY) 10 MG tablet Take 1 tablet (10 mg total) by mouth daily. 30 tablet 0    ARIPiprazole ER (ABILIFY MAINTENA) 400 MG SRER injection Inject 2 mLs (400 mg total) into the muscle every 28 (twenty-eight) days. 1 each 1    clonazePAM (KLONOPIN) 1 MG disintegrating tablet Take 1 tablet (1 mg total) by mouth at bedtime. (Patient not taking: Reported on 08/26/2022) 30 tablet 1    divalproex (DEPAKOTE) 500 MG DR tablet Take 1 tablet (500 mg total) by mouth 2 (two) times daily. 60 tablet 0    loperamide (IMODIUM A-D) 2 MG tablet Take 1 tablet (2 mg total) by mouth 4 (four) times daily as needed for diarrhea or loose stools. 30 tablet 0    nicotine (NICODERM CQ - DOSED IN MG/24 HOURS) 14 mg/24hr patch Place 1 patch (14 mg total) onto the skin daily. 14 patch 0    ondansetron (ZOFRAN) 4 MG tablet Take 1 tablet (4 mg total) by mouth daily as needed for nausea or vomiting. 30 tablet 1     Musculoskeletal: Strength & Muscle Tone: within normal limits Gait & Station: normal Patient leans: N/A            Psychiatric Specialty Exam:  Presentation  General Appearance:  Appropriate for Environment; Disheveled  Eye Contact: Good  Speech: Clear and Coherent  Speech Volume: Decreased  Handedness: Right   Mood and Affect  Mood: Anxious; Depressed  Affect: Restricted   Thought Process  Thought Processes: Linear  Duration of Psychotic Symptoms:N/A Past Diagnosis of Schizophrenia or Psychoactive disorder: No  Descriptions of Associations:Intact  Orientation:Full (Time, Place and Person)  Thought  Content:Rumination  Hallucinations:Hallucinations: None Description of Command Hallucinations: He claims he hears his dead father's voice.  Ideas of Reference:None  Suicidal Thoughts:Suicidal Thoughts: No SI Passive Intent and/or Plan: With Intent; With Plan  Homicidal  Thoughts:Homicidal Thoughts: No   Sensorium  Memory: Immediate Fair; Remote Fair; Recent Fair  Judgment: Poor  Insight: Fair   Community education officer  Concentration: Good  Attention Span: Good  Recall: Good  Fund of Knowledge: Fair  Language: Good   Psychomotor Activity  Psychomotor Activity: Psychomotor Activity: Normal   Assets  Assets: Desire for Improvement; Communication Skills; Social Support   Sleep  Sleep: Sleep: Poor    Physical Exam: Physical Exam Vitals and nursing note reviewed.  Constitutional:      Appearance: Normal appearance.  Neurological:     General: No focal deficit present.     Mental Status: He is alert and oriented to person, place, and time. Mental status is at baseline.  Psychiatric:        Thought Content: Thought content normal.        Judgment: Judgment normal.   Review of Systems  Psychiatric/Behavioral:  Positive for depression, hallucinations, substance abuse and suicidal ideas.   All other systems reviewed and are negative.  Blood pressure 111/84, pulse 82, temperature 97.6 F (36.4 C), temperature source Oral, resp. rate 16, height '6\' 2"'$  (1.88 m), weight 106 kg, SpO2 100 %. Body mass index is 29.99 kg/m.  Treatment Plan Summary: Daily contact with patient to assess and evaluate symptoms and progress in treatment and Medication management  Observation Level/Precautions:  Detox Fall 15 minute checks  Laboratory:   Admission labs.  Psychotherapy:    Medications:    Consultations:    Discharge Concerns:    Estimated LOS:  Other:     Physician Treatment Plan for Primary Diagnosis: Suicidal ideation Long Term Goal(s): Improvement in  symptoms so as ready for discharge  Short Term Goals: Ability to identify changes in lifestyle to reduce recurrence of condition will improve, Ability to disclose and discuss suicidal ideas, Ability to demonstrate self-control will improve, and Compliance with prescribed medications will improve  Physician Treatment Plan for Secondary Diagnosis: Principal Problem:   Suicidal ideation  Long Term Goal(s): Improvement in symptoms so as ready for discharge  Short Term Goals: Ability to identify changes in lifestyle to reduce recurrence of condition will improve, Ability to disclose and discuss suicidal ideas, Ability to identify and develop effective coping behaviors will improve, and Compliance with prescribed medications will improve   PLAN: Safety and Monitoring:             --  Voluntary admission to inpatient psychiatric unit for safety, stabilization and treatment             -- Daily contact with patient to assess and evaluate symptoms and progress in treatment             -- Patient's case to be discussed in multi-disciplinary team meeting             -- Observation Level : q15 minute checks             -- Vital signs:  q12 hours             -- Precautions: suicide, elopement, and assault   2. Psychiatric Diagnoses and Treatment:              The patient was restarted on admission on the following medications: Abilify 10 mg a day Depakote ER 500 mg twice a day, may need valproic acid level by Monday Hydroxyzine 25 mg as needed He is on the CIWA protocol. Restart Cymbalta 30 mg a day --The risks/benefits/side-effects/alternatives to this medication were discussed  in detail with the patient and time was given for questions. The patient consents to medication trial.  -- FDA             -- Metabolic profile and EKG monitoring obtained while on an atypical antipsychotic (BMI: Lipid Panel: HbgA1c: QTc:) as indicated             -- Encouraged patient to participate in unit milieu and in  scheduled group therapies              -- Short Term Goals: Ability to disclose and discuss suicidal ideas, Ability to demonstrate self-control will improve, and Ability to identify and develop effective coping behaviors will improve             -- Long Term Goals: Improvement in symptoms so as ready for discharge                3. Medical Issues Being Addressed:              Tobacco Use Disorder             -- Nicotine patch '21mg'$ /24 hours ordered             -- Smoking cessation encouraged   4. Discharge Planning:              -- Social work and case management to assist with discharge planning and identification of hospital follow-up needs prior to discharge             -- Estimated LOS: 5-7 days             -- Discharge Concerns: Need to establish a safety plan; Medication compliance and effectiveness             -- Discharge Goals: Return home with outpatient referrals for mental health follow-up including medication management/psychotherapy   I certify that inpatient services furnished can reasonably be expected to improve the patient's condition.    Ranae Palms, MD 3/8/202411:19 AM Total Time Spent in Direct Patient Care:  I personally spent 30 minutes on the unit in direct patient care. The direct patient care time included face-to-face time with the patient, reviewing the patient's chart, communicating with other professionals, and coordinating care. Greater than 50% of this time was spent in counseling or coordinating care with the patient regarding goals of hospitalization, psycho-education, and discharge planning needs.   Natalia Psychiatrist  Patient ID: Providence Lanius., male   DOB: February 27, 1991, 31 y.o.   MRN: [id number]

## 2022-08-28 NOTE — Progress Notes (Signed)
D: Pt denies SI/AH. Pt endorsed visual hallucinations that he reports have been improving. Pt has kept to self and has been sleeping within bedroom. Pt goal for today is arranging plans to be discharged as mother from [place] is to be visiting soon.    A: Pt was offered support and encouragement. Pt was given scheduled medications. Pt was encouraged to attend groups. Q 15 minute checks were done for safety.  R:Pt did not attend group. Minimal interaction with peers and staff. Pt is taking medication. Pt has no complaints.Pt receptive to treatment and safety maintained on unit.

## 2022-08-28 NOTE — Group Note (Signed)
Date:  08/28/2022 Time:  12:59 PM  Group Topic/Focus:  Orientation:   The focus of this group is to educate the patient on the purpose and policies of crisis stabilization and provide a format to answer questions about their admission.  The group details unit policies and expectations of patients while admitted.    Participation Level:  Did Not Attend  Participation Quality:      Affect:      Cognitive:      Insight: None  Engagement in Group:    Modes of Intervention:      Additional Comments:      Jerrye Beavers 08/28/2022, 12:59 PM

## 2022-08-28 NOTE — Group Note (Signed)
Date:  08/28/2022 Time:  1:01 PM  Group Topic/Focus:  Healthy Communication:   The focus of this group is to discuss communication, barriers to communication, as well as healthy ways to communicate with others.    Participation Level:  Did Not Attend  Participation Quality:      Affect:      Cognitive:      Insight: None  Engagement in Group:      Modes of Intervention:      Additional Comments:     Jerrye Beavers 08/28/2022, 1:01 PM

## 2022-08-29 DIAGNOSIS — R45851 Suicidal ideations: Secondary | ICD-10-CM | POA: Diagnosis not present

## 2022-08-29 MED ORDER — TRAZODONE HCL 150 MG PO TABS
75.0000 mg | ORAL_TABLET | Freq: Every evening | ORAL | Status: DC | PRN
  Administered 2022-08-29: 75 mg via ORAL
  Filled 2022-08-29: qty 1

## 2022-08-29 NOTE — Progress Notes (Signed)
D:  Patient's self inventory sheet, patient has fair sleep, sleep medication helpful.  Fair appetite, normal energy level, poor concentration.  Rated depression and hopeless 9, anxiety 10.  Denied withdrawals., then checked tremors, cravings, cramping, agitation, nausea, runny nose.  Denied SI.  Denied physical problems.  Denied physical pain.  Goal is discharge.  Plans to work on medicines.  No discharge plan. A:  Medications administered per MD orders.  Emotional support and encouragement given patient. R:  Denied SI and HI, contracts for safety.  Denied A/V hallucinations.  Safety maintained with 15 minute checks.  Patient stated hopes the medicines will work for him.  Rated anxiety 10+ and depression 9.  Slept 8 hours.   Patient has been pleasant and cooperative.  Plans to discharge Monday.

## 2022-08-29 NOTE — [facility] Group Notes (Signed)
[facility] Group Notes:  (Nursing/MHT/Case Management/Adjunct)  Date:  08/29/2022  Time:  8:55 PM  Type of Therapy:  Group Therapy  Participation Level:  Did Not Attend  Participation Quality:   na  Affect:   na  Cognitive:   na  Insight:  None  Engagement in Group:   na  Modes of Intervention:   na  Summary of Progress/Problems: did not attend .  Orvan Falconer 08/29/2022, 8:55 PM

## 2022-08-29 NOTE — Group Note (Signed)
[facility] LCSW Group Therapy Note   Group Date: 08/29/2022 Start Time: 1300 End Time: 1400  Type of Therapy and Topic:  Group Therapy:  Feelings around Relapse and Recovery  Participation Level:  Did Not Attend   Mood:  Description of Group:    Patients in this group will discuss emotions they experience before and after a relapse. They will process how experiencing these feelings, or avoidance of experiencing them, relates to having a relapse. Facilitator will guide patients to explore emotions they have related to recovery. Patients will be encouraged to process which emotions are more powerful. They will be guided to discuss the emotional reaction significant others in their lives may have to patients' relapse or recovery. Patients will be assisted in exploring ways to respond to the emotions of others without this contributing to a relapse.  Therapeutic Goals: Patient will identify two or more emotions that lead to relapse for them:  Patient will identify two emotions that result when they relapse:  Patient will identify two emotions related to recovery:  Patient will demonstrate ability to communicate their needs through discussion and/or role plays.   Summary of Patient Progress: Patient did not attend group despite encouraged participation.    Therapeutic Modalities:   Cognitive Behavioral Therapy Solution-Focused Therapy Assertiveness Training Relapse Prevention Therapy   Durenda Hurt, [place]

## 2022-08-29 NOTE — Progress Notes (Signed)
   08/29/22 2229  Psych Admission Type (Psych Patients Only)  Admission Status Voluntary  Psychosocial Assessment  Patient Complaints Depression  Eye Contact Brief  Facial Expression Flat  Affect Appropriate to circumstance  Speech Logical/coherent  Interaction Assertive  Motor Activity Other (Comment) (WDL)  Appearance/Hygiene Unremarkable  Behavior Characteristics Appropriate to situation  Mood Depressed;Pleasant  Thought Process  Coherency WDL  Content WDL  Delusions None reported or observed  Perception WDL  Hallucination None reported or observed  Judgment Impaired  Confusion None  Danger to Self  Current suicidal ideation? Denies  Self-Injurious Behavior No self-injurious ideation or behavior indicators observed or expressed   Agreement Not to Harm Self Yes  Description of Agreement verbal  Danger to Others  Danger to Others None reported or observed

## 2022-08-29 NOTE — Progress Notes (Signed)
Patient refused flu vaccine today.  Rescheduled for Sunday 08/30/2022.

## 2022-08-29 NOTE — Progress Notes (Signed)
[facility] MD Progress Note  08/29/2022 4:03 PM Cody Young.  MRN:  [id number] Subjective:   The patient is a 32 year old Caucasian male who was admitted through the ED on a voluntary basis with symptoms of depression and suicidal ideations.  Apparently had relapsed on alcohol and cocaine for the last 3 to 4 months.  He was having persistent thoughts of wanting to cut his wrist and was also having some command hallucinations for which he went to the ED on a voluntary basis.  From the [facility] ED the patient was sent to [facility] behavioral health for further treatment.  The patient is admitted on a voluntary basis.  On assessment 3/9, the patient appears tired with a depressed affect.  His thought process is linear and logical.  The patient reports that his auditory hallucinations have resolved.  He denies side effects to the medications.  He request decrease in trazodone which will be done.  He reports that he relapsed because he was living with cocaine addicts.  He states that he plans to go live with his mother and to group therapy for substance use.  He states that he was clean for 11 years previously.  Principal Problem: Suicidal ideation Diagnosis: Principal Problem:   Suicidal ideation Active Problems:   Depression   Cocaine abuse ([place])   Hallucinations  Total Time spent with patient: 20 minutes  Past Psychiatric History: as above  Past Medical History:  Past Medical History:  Diagnosis Date   Bipolar 1 disorder ([place])     Past Surgical History:  Procedure Laterality Date   BRAIN SURGERY     Family History: History reviewed. No pertinent family history. Family Psychiatric  History: per H and P Social History:  Social History   Substance and Sexual Activity  Alcohol Use Yes   Alcohol/week: 12.0 standard drinks of alcohol   Types: 12 Cans of beer per week   Comment: 12 beers per day     Social History   Substance and Sexual Activity  Drug Use Yes   Types: Marijuana, Cocaine,  "Crack" cocaine   Comment: last use cocaine was 08/25/22    Social History   Socioeconomic History   Marital status: Single    Spouse name: Not on file   Number of children: Not on file   Years of education: Not on file   Highest education level: Not on file  Occupational History   Not on file  Tobacco Use   Smoking status: Every Day    Packs/day: 1.00    Years: 20.00    Total pack years: 20.00    Types: Cigarettes   Smokeless tobacco: Never  Substance and Sexual Activity   Alcohol use: Yes    Alcohol/week: 12.0 standard drinks of alcohol    Types: 12 Cans of beer per week    Comment: 12 beers per day   Drug use: Yes    Types: Marijuana, Cocaine, "Crack" cocaine    Comment: last use cocaine was 08/25/22   Sexual activity: Yes    Birth control/protection: Condom  Other Topics Concern   Not on file  Social History Narrative   Not on file   Social Determinants of Health   Financial Resource Strain: Not on file  Food Insecurity: No Food Insecurity (08/27/2022)   Hunger Vital Sign    Worried About Running Out of Food in the Last Year: Never true    Ran Out of Food in the Last Year: Never true  Transportation  Needs: No Transportation Needs (08/27/2022)   PRAPARE - Hydrologist (Medical): No    Lack of Transportation (Non-Medical): No  Physical Activity: Not on file  Stress: Not on file  Social Connections: Not on file   Additional Social History:                         Sleep: Fair  Appetite:  Fair  Current Medications: Current Facility-Administered Medications  Medication Dose Route Frequency Provider Last Rate Last Admin   acetaminophen (TYLENOL) tablet 650 mg  650 mg Oral Q6H PRN Bennett, Christal H, NP   650 mg at 08/27/22 1009   alum & mag hydroxide-simeth (MAALOX/MYLANTA) 200-200-20 MG/5ML suspension 30 mL  30 mL Oral Q4H PRN Bennett, Christal H, NP       ARIPiprazole (ABILIFY) tablet 10 mg  10 mg Oral Daily Bennett, Christal  H, NP   10 mg at 08/29/22 0748   divalproex (DEPAKOTE) DR tablet 500 mg  500 mg Oral BID Bennett, Christal H, NP   500 mg at 08/29/22 0749   DULoxetine (CYMBALTA) DR capsule 30 mg  30 mg Oral Daily Ranae Palms, MD   30 mg at 08/29/22 0749   hydrOXYzine (ATARAX) tablet 25 mg  25 mg Oral Q6H PRN Evette Georges, NP   25 mg at 08/28/22 1021   influenza vac split quadrivalent PF (FLUARIX) injection 0.5 mL  0.5 mL Intramuscular Tomorrow-1000 Evette Georges, NP       loperamide (IMODIUM) capsule 2-4 mg  2-4 mg Oral PRN Evette Georges, NP       LORazepam (ATIVAN) tablet 1 mg  1 mg Oral Q6H PRN Evette Georges, NP   1 mg at 08/27/22 1006   magnesium hydroxide (MILK OF MAGNESIA) suspension 30 mL  30 mL Oral Daily PRN Bennett, Christal H, NP       multivitamin with minerals tablet 1 tablet  1 tablet Oral Daily Evette Georges, NP   1 tablet at 08/29/22 0752   ondansetron (ZOFRAN-ODT) disintegrating tablet 4 mg  4 mg Oral Q6H PRN Evette Georges, NP       thiamine (Vitamin B-1) tablet 100 mg  100 mg Oral Daily Evette Georges, NP   100 mg at 08/29/22 0749   traZODone (DESYREL) tablet 75 mg  75 mg Oral QHS PRN Dian Situ, MD        Lab Results: No results found for this or any previous visit (from the past 48 hour(s)).  Blood Alcohol level:  Lab Results  Component Value Date   ETH <10 08/26/2022   ETH <10 [id number]    Metabolic Disorder Labs: No results found for: "HGBA1C", "MPG" No results found for: "PROLACTIN" No results found for: "CHOL", "TRIG", "HDL", "CHOLHDL", "VLDL", "[place]" Psychiatric Specialty Exam: Physical Exam Constitutional:      Appearance: the patient is not toxic-appearing.  Pulmonary:     Effort: Pulmonary effort is normal.  Neurological:     General: No focal deficit present.     Mental Status: the patient is alert and oriented to person, place, and time.   Review of Systems  Respiratory:  Negative for shortness of breath.   Cardiovascular:  Negative for chest pain.   Gastrointestinal:  Negative for abdominal pain, constipation, diarrhea, nausea and vomiting.  Neurological:  Negative for headaches.      BP 104/70   Pulse 64   Temp 97.8 F (36.6 C) (Oral)   Resp 18  Ht '6\' 2"'$  (1.88 m)   Wt 106 kg   SpO2 99%   BMI 29.99 kg/m   General Appearance: Fairly Groomed  Eye Contact:  Good  Speech:  Clear and Coherent  Volume:  Normal  Mood: depressed  Affect:  Congruent  Thought Process:  Coherent  Orientation:  Full (Time, Place, and Person)  Thought Content: Logical   Suicidal Thoughts:  No  Homicidal Thoughts:  No  Memory:  Immediate;   Good  Judgement:  fair  Insight:  fair  Psychomotor Activity:  Normal  Concentration:  Concentration: Good  Recall:  Good  Fund of Knowledge: Good  Language: Good  Akathisia:  No  Handed:    AIMS (if indicated): not done  Assets:  Communication Skills Desire for Improvement Financial Resources/Insurance Housing Leisure Time Physical Health  ADL's:  Intact  Cognition: WNL  Sleep:  Fair    Treatment Plan Summary: Daily contact with patient to assess and evaluate symptoms and progress in treatment and Medication management  PLAN: Safety and Monitoring:             --  Voluntary admission to inpatient psychiatric unit for safety, stabilization and treatment             -- Daily contact with patient to assess and evaluate symptoms and progress in treatment             -- Patient's case to be discussed in multi-disciplinary team meeting             -- Observation Level : q15 minute checks             -- Vital signs:  q12 hours             -- Precautions: suicide, elopement, and assault   2. Psychiatric Diagnoses and Treatment:              The patient was restarted on admission on the following medications: Abilify 10 mg a day Depakote ER 500 mg twice a day, may need valproic acid level by Monday Hydroxyzine 25 mg as needed He is on the CIWA protocol. Restart Cymbalta 30 mg a day --The  risks/benefits/side-effects/alternatives to this medication were discussed in detail with the patient and time was given for questions. The patient consents to medication trial.  -- FDA             -- Metabolic profile and EKG monitoring obtained while on an atypical antipsychotic (BMI: Lipid Panel: HbgA1c: QTc:) as indicated             -- Encouraged patient to participate in unit milieu and in scheduled group therapies              -- Short Term Goals: Ability to disclose and discuss suicidal ideas, Ability to demonstrate self-control will improve, and Ability to identify and develop effective coping behaviors will improve             -- Long Term Goals: Improvement in symptoms so as ready for discharge                3. Medical Issues Being Addressed:              Tobacco Use Disorder             -- Nicotine patch '21mg'$ /24 hours ordered             -- Smoking cessation encouraged   4. Discharge Planning:              --  Social work and case management to assist with discharge planning and identification of hospital follow-up needs prior to discharge             -- Estimated LOS: 5-7 days             -- Discharge Concerns: Need to establish a safety plan; Medication compliance and effectiveness             -- Discharge Goals: Return home with outpatient referrals for mental health follow-up including medication management/psychotherapy  Corky Sox, MD 08/29/2022, 4:03 PM

## 2022-08-29 NOTE — Plan of Care (Signed)
Nurse discussed anxiety, depression and coping skills with patient.  

## 2022-08-30 DIAGNOSIS — R45851 Suicidal ideations: Secondary | ICD-10-CM | POA: Diagnosis not present

## 2022-08-30 LAB — VALPROIC ACID LEVEL: Valproic Acid Lvl: 37 ug/mL — ABNORMAL LOW (ref 50.0–100.0)

## 2022-08-30 MED ORDER — DIVALPROEX SODIUM ER 500 MG PO TB24
1000.0000 mg | ORAL_TABLET | Freq: Every day | ORAL | Status: DC
  Administered 2022-08-30 – 2022-09-03 (×5): 1000 mg via ORAL
  Filled 2022-08-30: qty 2
  Filled 2022-08-30: qty 14
  Filled 2022-08-30 (×3): qty 2
  Filled 2022-08-30: qty 14
  Filled 2022-08-30: qty 2

## 2022-08-30 MED ORDER — TRAZODONE HCL 100 MG PO TABS
100.0000 mg | ORAL_TABLET | Freq: Every evening | ORAL | Status: DC | PRN
  Administered 2022-08-30 – 2022-09-01 (×3): 100 mg via ORAL
  Filled 2022-08-30 (×4): qty 1

## 2022-08-30 MED ORDER — NICOTINE POLACRILEX 2 MG MT GUM
2.0000 mg | CHEWING_GUM | OROMUCOSAL | Status: DC | PRN
  Administered 2022-09-01 – 2022-09-02 (×4): 2 mg via ORAL
  Filled 2022-08-30: qty 1

## 2022-08-30 NOTE — Group Note (Signed)
Date:  08/30/2022 Time:  4:30 PM  Group Topic/Focus:  Goals Group:   The focus of this group is to help patients establish daily goals to achieve during treatment and discuss how the patient can incorporate goal setting into their daily lives to aide in recovery.    Participation Level:  Did Not Attend   Affect:      Cognitive:      Insight: None  Engagement in Group:    Modes of Intervention:      Additional Comments:     Jerrye Beavers 08/30/2022, 4:30 PM

## 2022-08-30 NOTE — BHH Group Notes (Signed)
The focus of this group is to help patients review their daily goal of treatment and discuss progress on daily workbooks. Pt was not present for tonight's wrap up group.

## 2022-08-30 NOTE — Group Note (Signed)
Date:  08/30/2022 Time:  9:51 AM  Group Topic/Focus:  Dimensions of Wellness:   The focus of this group is to introduce the topic of wellness and discuss the role each dimension of wellness plays in total health.    Participation Level:  Did Not Attend  Participation Quality:    Affect:      Cognitive:      Insight: None  Engagement in Group:      Modes of Intervention:      Additional Comments:     Jerrye Beavers 08/30/2022, 9:51 AM

## 2022-08-30 NOTE — Progress Notes (Signed)
   08/30/22 2215  Psych Admission Type (Psych Patients Only)  Admission Status Voluntary  Psychosocial Assessment  Patient Complaints Depression  Eye Contact Brief  Facial Expression Flat  Affect Appropriate to circumstance;Blunted;Depressed  Speech Logical/coherent  Interaction Assertive  Motor Activity Slow  Appearance/Hygiene Disheveled  Behavior Characteristics Appropriate to situation  Mood Depressed;Pleasant  Thought Process  Coherency WDL  Content WDL  Delusions None reported or observed  Perception WDL  Hallucination None reported or observed  Judgment Poor  Confusion None  Danger to Self  Current suicidal ideation? Denies  Self-Injurious Behavior No self-injurious ideation or behavior indicators observed or expressed   Agreement Not to Harm Self No  Description of Agreement verbal  Danger to Others  Danger to Others None reported or observed

## 2022-08-30 NOTE — Progress Notes (Signed)
[facility] MD Progress Note  08/30/2022 1:53 PM Cody Young.  MRN:  [id number] Subjective:   The patient is a 32 year old Caucasian male who was admitted through the ED on a voluntary basis with symptoms of depression and suicidal ideations.  Apparently had relapsed on alcohol and cocaine for the last 3 to 4 months.  He was having persistent thoughts of wanting to cut his wrist and was also having some command hallucinations for which he went to the ED on a voluntary basis.  From the [facility] ED the patient was sent to [facility] behavioral health for further treatment.  The patient is admitted on a voluntary basis.  On assessment 3/10, the patient reports continued drowsiness.  He states that he is hopeful about his substance use treatment.  He continues to seek benzodiazepines.  He reports the Cymbalta is working well for him.  Discussed changing his Depakote to nighttime extended release to avoid morning sedation.  The patient is in agreement.  Discussed increasing trazodone to 100 mg given the patient's reported benefit.  The patient denies auditory/visual hallucinations and first rank symptoms.  He reports good mood, appetite, and sleep. He denies suicidal and homicidal thoughts. He denies side effects from his medications.  Review of systems as below.   Principal Problem: Suicidal ideation Diagnosis: Principal Problem:   Suicidal ideation Active Problems:   Depression   Cocaine abuse ([place])   Hallucinations  Total Time spent with patient: 20 minutes  Past Psychiatric History: as above  Past Medical History:  Past Medical History:  Diagnosis Date   Bipolar 1 disorder ([place])     Past Surgical History:  Procedure Laterality Date   BRAIN SURGERY     Family History: History reviewed. No pertinent family history. Family Psychiatric  History: per H and P Social History:  Social History   Substance and Sexual Activity  Alcohol Use Yes   Alcohol/week: 12.0 standard drinks of alcohol    Types: 12 Cans of beer per week   Comment: 12 beers per day     Social History   Substance and Sexual Activity  Drug Use Yes   Types: Marijuana, Cocaine, "Crack" cocaine   Comment: last use cocaine was 08/25/22    Social History   Socioeconomic History   Marital status: Single    Spouse name: Not on file   Number of children: Not on file   Years of education: Not on file   Highest education level: Not on file  Occupational History   Not on file  Tobacco Use   Smoking status: Every Day    Packs/day: 1.00    Years: 20.00    Total pack years: 20.00    Types: Cigarettes   Smokeless tobacco: Never  Substance and Sexual Activity   Alcohol use: Yes    Alcohol/week: 12.0 standard drinks of alcohol    Types: 12 Cans of beer per week    Comment: 12 beers per day   Drug use: Yes    Types: Marijuana, Cocaine, "Crack" cocaine    Comment: last use cocaine was 08/25/22   Sexual activity: Yes    Birth control/protection: Condom  Other Topics Concern   Not on file  Social History Narrative   Not on file   Social Determinants of Health   Financial Resource Strain: Not on file  Food Insecurity: No Food Insecurity (08/27/2022)   Hunger Vital Sign    Worried About Running Out of Food in the Last Year: Never true  Ran Out of Food in the Last Year: Never true  Transportation Needs: No Transportation Needs (08/27/2022)   PRAPARE - Hydrologist (Medical): No    Lack of Transportation (Non-Medical): No  Physical Activity: Not on file  Stress: Not on file  Social Connections: Not on file   Additional Social History:                         Sleep: Fair  Appetite:  Fair  Current Medications: Current Facility-Administered Medications  Medication Dose Route Frequency Provider Last Rate Last Admin   acetaminophen (TYLENOL) tablet 650 mg  650 mg Oral Q6H PRN Bennett, Christal H, NP   650 mg at 08/27/22 1009   alum & mag hydroxide-simeth  (MAALOX/MYLANTA) 200-200-20 MG/5ML suspension 30 mL  30 mL Oral Q4H PRN Bennett, Christal H, NP       ARIPiprazole (ABILIFY) tablet 10 mg  10 mg Oral Daily Bennett, Christal H, NP   10 mg at 08/30/22 0748   divalproex (DEPAKOTE) DR tablet 500 mg  500 mg Oral BID Bennett, Christal H, NP   500 mg at 08/30/22 0748   DULoxetine (CYMBALTA) DR capsule 30 mg  30 mg Oral Daily Ranae Palms, MD   30 mg at 08/30/22 0748   influenza vac split quadrivalent PF (FLUARIX) injection 0.5 mL  0.5 mL Intramuscular Tomorrow-1000 Evette Georges, NP       magnesium hydroxide (MILK OF MAGNESIA) suspension 30 mL  30 mL Oral Daily PRN Bennett, Christal H, NP       multivitamin with minerals tablet 1 tablet  1 tablet Oral Daily Evette Georges, NP   1 tablet at 08/30/22 0748   thiamine (Vitamin B-1) tablet 100 mg  100 mg Oral Daily Evette Georges, NP   100 mg at 08/30/22 0749   traZODone (DESYREL) tablet 75 mg  75 mg Oral QHS PRN Dian Situ, MD   75 mg at 08/29/22 2103    Lab Results:  Results for orders placed or performed during the hospital encounter of 08/26/22 (from the past 48 hour(s))  Valproic acid level     Status: Abnormal   Collection Time: 08/30/22  6:42 AM  Result Value Ref Range   Valproic Acid Lvl 37 (L) 50.0 - 100.0 ug/mL    Comment: Performed at [facility], [place] [address] [place], [place] [address]    Blood Alcohol level:  Lab Results  Component Value Date   ETH <10 08/26/2022   ETH <10 [id number]    Metabolic Disorder Labs: No results found for: "HGBA1C", "MPG" No results found for: "PROLACTIN" No results found for: "CHOL", "TRIG", "HDL", "CHOLHDL", "VLDL", "[place]" Psychiatric Specialty Exam: Physical Exam Constitutional:      Appearance: the patient is not toxic-appearing.  Pulmonary:     Effort: Pulmonary effort is normal.  Neurological:     General: No focal deficit present.     Mental Status: the patient is alert and oriented to person, place, and time.    Review of Systems  Respiratory:  Negative for shortness of breath.   Cardiovascular:  Negative for chest pain.  Gastrointestinal:  Negative for abdominal pain, constipation, diarrhea, nausea and vomiting.  Neurological:  Negative for headaches.      BP 101/74 (BP Location: Left Arm)   Pulse 74   Temp 97.7 F (36.5 C) (Oral)   Resp 20   Ht '6\' 2"'$  (1.88 m)   Wt  106 kg   SpO2 99%   BMI 29.99 kg/m   General Appearance: Fairly Groomed  Eye Contact:  Good  Speech:  Clear and Coherent  Volume:  Normal  Mood: depressed  Affect:  Congruent  Thought Process:  Coherent  Orientation:  Full (Time, Place, and Person)  Thought Content: Logical   Suicidal Thoughts:  No  Homicidal Thoughts:  No  Memory:  Immediate;   Good  Judgement:  fair  Insight:  fair  Psychomotor Activity:  Normal  Concentration:  Concentration: Good  Recall:  Good  Fund of Knowledge: Good  Language: Good  Akathisia:  No  Handed:    AIMS (if indicated): not done  Assets:  Communication Skills Desire for Improvement Financial Resources/Insurance Housing Leisure Time Physical Health  ADL's:  Intact  Cognition: WNL  Sleep:  Fair    Treatment Plan Summary: Daily contact with patient to assess and evaluate symptoms and progress in treatment and Medication management  PLAN: Safety and Monitoring:             --  Voluntary admission to inpatient psychiatric unit for safety, stabilization and treatment             -- Daily contact with patient to assess and evaluate symptoms and progress in treatment             -- Patient's case to be discussed in multi-disciplinary team meeting             -- Observation Level : q15 minute checks             -- Vital signs:  q12 hours             -- Precautions: suicide, elopement, and assault   2. Psychiatric Diagnoses and Treatment:              The patient was restarted on admission on the following medications: Abilify 10 mg a day Depakote is being changed from  immediate release twice daily dosing to extended release nightly dosing.  Start Depakote extended release 1000 mg nightly on 3/10. The patient's Depakote level on 3/9 was subtherapeutic at 37. Hydroxyzine 25 mg as needed He is on the CIWA protocol. Restart Cymbalta 30 mg a day Trazodone 100 mg nightly --The risks/benefits/side-effects/alternatives to this medication were discussed in detail with the patient and time was given for questions. The patient consents to medication trial.  -- FDA             -- Metabolic profile and EKG monitoring obtained while on an atypical antipsychotic (BMI: Lipid Panel: HbgA1c: QTc:) as indicated             -- Encouraged patient to participate in unit milieu and in scheduled group therapies              -- Short Term Goals: Ability to disclose and discuss suicidal ideas, Ability to demonstrate self-control will improve, and Ability to identify and develop effective coping behaviors will improve             -- Long Term Goals: Improvement in symptoms so as ready for discharge                3. Medical Issues Being Addressed:              Tobacco Use Disorder             -- Nicotine patch '21mg'$ /24 hours ordered             --  Smoking cessation encouraged   4. Discharge Planning:              -- Social work and case management to assist with discharge planning and identification of hospital follow-up needs prior to discharge             -- Estimated LOS: 5-7 days             -- Discharge Concerns: Need to establish a safety plan; Medication compliance and effectiveness             -- Discharge Goals: Return home with outpatient referrals for mental health follow-up including medication management/psychotherapy  Corky Sox, MD 08/30/2022, 1:53 PM

## 2022-08-30 NOTE — Progress Notes (Signed)
Patient has been isolative to his room tonight and did not attend group. He was compliant with his medications. He was encouraged to attend groups on tomorrow and meals. Support given and safety maintained with 15 min checks.  08/30/22 2215  Psych Admission Type (Psych Patients Only)  Admission Status Voluntary  Psychosocial Assessment  Patient Complaints Depression  Eye Contact Brief  Facial Expression Flat  Affect Appropriate to circumstance;Blunted;Depressed  Speech Logical/coherent  Interaction Assertive  Motor Activity Slow  Appearance/Hygiene Disheveled  Behavior Characteristics Appropriate to situation  Mood Depressed;Pleasant  Thought Process  Coherency WDL  Content WDL  Delusions None reported or observed  Perception WDL  Hallucination None reported or observed  Judgment Poor  Confusion None  Danger to Self  Current suicidal ideation? Denies  Self-Injurious Behavior No self-injurious ideation or behavior indicators observed or expressed   Agreement Not to Harm Self No  Description of Agreement verbal  Danger to Others  Danger to Others None reported or observed

## 2022-08-31 NOTE — Plan of Care (Signed)
  Problem: Education: Goal: Knowledge of [facility] Education information/materials will improve Outcome: Progressing Goal: Emotional status will improve Outcome: Progressing Goal: Mental status will improve Outcome: Progressing Goal: Verbalization of understanding the information provided will improve Outcome: Progressing   Problem: Activity: Goal: Interest or engagement in activities will improve Outcome: Progressing Goal: Sleeping patterns will improve Outcome: Progressing   

## 2022-08-31 NOTE — [facility] Group Notes (Signed)
Spiritual care group on grief and loss facilitated by Chaplain Katy Tonyetta Berko, Bcc and Colleen Pesci, counseling intern.  Group Goal: Support / Education around grief and loss  Members engage in facilitated group support and psycho-social education.  Group Description:  Following introductions and group rules, group members engaged in facilitated group dialogue and support around topic of loss, with particular support around experiences of loss in their lives. Group Identified types of loss (relationships / self / things) and identified patterns, circumstances, and changes that precipitate losses. Reflected on thoughts / feelings around loss, normalized grief responses, and recognized variety in grief experience. Group encouraged individual reflection on safe space and on the coping skills that they are already utilizing.  Group drew on Adlerian / Rogerian and narrative framework  Patient Progress: Did not attend.  

## 2022-08-31 NOTE — Progress Notes (Signed)
   08/31/22 0932  Psych Admission Type (Psych Patients Only)  Admission Status Voluntary  Psychosocial Assessment  Patient Complaints Depression  Eye Contact Brief  Facial Expression Flat  Affect Appropriate to circumstance  Speech Logical/coherent  Interaction Assertive  Motor Activity Slow  Appearance/Hygiene Disheveled  Behavior Characteristics Appropriate to situation  Mood Depressed;Pleasant  Aggressive Behavior  Effect No apparent injury  Thought Process  Coherency WDL  Content WDL  Delusions WDL  Perception WDL  Hallucination None reported or observed  Judgment Poor  Confusion None  Danger to Self  Current suicidal ideation? Denies  Self-Injurious Behavior No self-injurious ideation or behavior indicators observed or expressed   Agreement Not to Harm Self No  Description of Agreement Verbal  Danger to Others  Danger to Others None reported or observed

## 2022-08-31 NOTE — Progress Notes (Signed)
[facility] MD Progress Note  08/31/2022 3:08 PM Providence Lanius.  MRN:  [id number]  Subjective:   The patient is a 32 year old Caucasian male who was admitted through the ED on a voluntary basis with symptoms of depression and suicidal ideations.  Apparently had relapsed on alcohol and cocaine for the last 3 to 4 months.  He was having persistent thoughts of wanting to cut his wrist and was also having some command hallucinations for which he went to the ED on a voluntary basis.  From the [facility] ED the patient was sent to [facility] behavioral health for further treatment.  The patient is admitted on a voluntary basis.   Patient states that he continues to have difficulty with terminal insomnia.  He reports that fatigue is not changed with augmenting his Depakote from twice daily dosing to nightly only dosing.  He reports feeling fatigued because he feels so depressed.  Reports mood is depressed down and sad.  Reports appetite is okay.  He continues to isolate to his room.  He reports going to 1 group this morning and hopes to go to 1 in the afternoon.  We discussed that this would be a reasonable goal, and that tomorrow the goal would be for him to go to groups in the morning and 2 groups in the afternoon.  Patient reports that concentration is poor due to depression.  He denies having any suicidal thoughts at this time.  Denies any HI.  Denies any psychotic symptoms. He denies any side effects to Cymbalta, Depakote, or Abilify.  Reports these are somewhat helpful since starting.  I inquired further into his prior to admission medication history-he states he was taking Depakote prior to admission, but was not taking Abilify regularly.  He reports last Abilify LAI was in December 2023.  Reports having out-of-state Medicaid.  He clarified a previous psychiatric diagnosis of bipolar disorder.   Principal Problem: Severe bipolar I disorder, most recent episode depressed ([place]) Diagnosis: Principal Problem:   Severe  bipolar I disorder, most recent episode depressed ([place]) Active Problems:   Cocaine abuse ([place])   Hallucinations  Total Time spent with patient: 15 minutes  Past Psychiatric History:  By self-report patient has multiple hospitalizations both in [place] and here in [place].  His most recent hospitalization was in December 2023 to [facility].   Past Medical History:  Past Medical History:  Diagnosis Date   Bipolar 1 disorder ([place])     Past Surgical History:  Procedure Laterality Date   BRAIN SURGERY     Family History: History reviewed. No pertinent family history. Family Psychiatric  History: See H&P Social History:  Social History   Substance and Sexual Activity  Alcohol Use Yes   Alcohol/week: 12.0 standard drinks of alcohol   Types: 12 Cans of beer per week   Comment: 12 beers per day     Social History   Substance and Sexual Activity  Drug Use Yes   Types: Marijuana, Cocaine, "Crack" cocaine   Comment: last use cocaine was 08/25/22    Social History   Socioeconomic History   Marital status: Single    Spouse name: Not on file   Number of children: Not on file   Years of education: Not on file   Highest education level: Not on file  Occupational History   Not on file  Tobacco Use   Smoking status: Every Day    Packs/day: 1.00    Years: 20.00    Total  pack years: 20.00    Types: Cigarettes   Smokeless tobacco: Never  Substance and Sexual Activity   Alcohol use: Yes    Alcohol/week: 12.0 standard drinks of alcohol    Types: 12 Cans of beer per week    Comment: 12 beers per day   Drug use: Yes    Types: Marijuana, Cocaine, "Crack" cocaine    Comment: last use cocaine was 08/25/22   Sexual activity: Yes    Birth control/protection: Condom  Other Topics Concern   Not on file  Social History Narrative   Not on file   Social Determinants of Health   Financial Resource Strain: Not on file  Food Insecurity: No Food Insecurity (08/27/2022)   Hunger Vital  Sign    Worried About Running Out of Food in the Last Year: Never true    Ran Out of Food in the Last Year: Never true  Transportation Needs: No Transportation Needs (08/27/2022)   PRAPARE - Hydrologist (Medical): No    Lack of Transportation (Non-Medical): No  Physical Activity: Not on file  Stress: Not on file  Social Connections: Not on file   Additional Social History:                         Sleep: Fair  Appetite:  Fair  Current Medications: Current Facility-Administered Medications  Medication Dose Route Frequency Provider Last Rate Last Admin   acetaminophen (TYLENOL) tablet 650 mg  650 mg Oral Q6H PRN Bennett, Christal H, NP   650 mg at 08/30/22 2118   alum & mag hydroxide-simeth (MAALOX/MYLANTA) 200-200-20 MG/5ML suspension 30 mL  30 mL Oral Q4H PRN Bennett, Christal H, NP       ARIPiprazole (ABILIFY) tablet 10 mg  10 mg Oral Daily Bennett, Christal H, NP   10 mg at 08/31/22 0941   divalproex (DEPAKOTE ER) 24 hr tablet 1,000 mg  1,000 mg Oral QHS Corky Sox, MD   1,000 mg at 08/30/22 2116   DULoxetine (CYMBALTA) DR capsule 30 mg  30 mg Oral Daily Ranae Palms, MD   30 mg at 08/31/22 0941   influenza vac split quadrivalent PF (FLUARIX) injection 0.5 mL  0.5 mL Intramuscular Tomorrow-1000 Evette Georges, NP       magnesium hydroxide (MILK OF MAGNESIA) suspension 30 mL  30 mL Oral Daily PRN Bennett, Christal H, NP       multivitamin with minerals tablet 1 tablet  1 tablet Oral Daily Evette Georges, NP   1 tablet at 08/31/22 0941   nicotine polacrilex (NICORETTE) gum 2 mg  2 mg Oral PRN Bobbitt, Shalon E, NP       thiamine (Vitamin B-1) tablet 100 mg  100 mg Oral Daily Evette Georges, NP   100 mg at 08/31/22 0941   traZODone (DESYREL) tablet 100 mg  100 mg Oral QHS PRN Corky Sox, MD   100 mg at 08/30/22 2116    Lab Results:  Results for orders placed or performed during the hospital encounter of 08/26/22 (from the past 48  hour(s))  Valproic acid level     Status: Abnormal   Collection Time: 08/30/22  6:42 AM  Result Value Ref Range   Valproic Acid Lvl 37 (L) 50.0 - 100.0 ug/mL    Comment: Performed at [facility], [place] [address] [place], [place] [address]    Blood Alcohol level:  Lab Results  Component Value Date  ETH <10 08/26/2022   ETH <10 [id number]    Metabolic Disorder Labs: No results found for: "HGBA1C", "MPG" No results found for: "PROLACTIN" No results found for: "CHOL", "TRIG", "HDL", "CHOLHDL", "VLDL", "LDLCALC"  Physical Findings: AIMS:  , ,  ,  ,    CIWA:  CIWA-Ar Total: 1 COWS:     Musculoskeletal: Strength & Muscle Tone: within normal limits Gait & Station: normal Patient leans: N/A  Psychiatric Specialty Exam:  Presentation  General Appearance:  Casual  Eye Contact: Fair  Speech: Normal Rate  Speech Volume: Decreased  Handedness: Right   Mood and Affect  Mood: Depressed  Affect: Depressed; Constricted   Thought Process  Thought Processes: Linear  Descriptions of Associations:Intact  Orientation:Full (Time, Place and Person)  Thought Content:Logical  History of Schizophrenia/Schizoaffective disorder:No  Duration of Psychotic Symptoms:Less than six months  Hallucinations:Hallucinations: None  Ideas of Reference:None  Suicidal Thoughts:Suicidal Thoughts: No  Homicidal Thoughts:Homicidal Thoughts: No   Sensorium  Memory: Immediate Fair; Recent Fair; Remote Fair  Judgment: Intact  Insight: Fair   Community education officer  Concentration: Fair  Attention Span: Fair  Recall: Good  Fund of Knowledge: Fair  Language: Good   Psychomotor Activity  Psychomotor Activity: Psychomotor Activity: Decreased   Assets  Assets: Desire for Improvement; Communication Skills; Social Support   Sleep  Sleep: Sleep: Poor    Physical Exam: Physical Exam Vitals reviewed.  Pulmonary:     Effort: Pulmonary  effort is normal.  Neurological:     Mental Status: He is alert.     Motor: No weakness.     Gait: Gait normal.    Review of Systems  Neurological:  Negative for dizziness, tingling, tremors and headaches.  Psychiatric/Behavioral:  Positive for depression. The patient is nervous/anxious and has insomnia.    Blood pressure 124/83, pulse 79, temperature 97.7 F (36.5 C), temperature source Oral, resp. rate 20, height '6\' 2"'$  (1.88 m), weight 106 kg, SpO2 100 %. Body mass index is 29.99 kg/m.   Treatment Plan Summary: Daily contact with patient to assess and evaluate symptoms and progress in treatment and Medication management  Assessment: -Bipolar disorder, current episode is depressed -Alcohol use -Cocaine use  PLAN: Safety and Monitoring:             --  Voluntary admission to inpatient psychiatric unit for safety, stabilization and treatment             -- Daily contact with patient to assess and evaluate symptoms and progress in treatment             -- Patient's case to be discussed in multi-disciplinary team meeting             -- Observation Level : q15 minute checks             -- Vital signs:  q12 hours             -- Precautions: suicide, elopement, and assault   2. Psychiatric Diagnoses and Treatment:               -Continue Abilify 10 mg once daily -Continue Depakote ER 1000 mg nightly (change from twice daily dosing to QHS only dosing on 3-10) -Continue Cymbalta 30 mg once daily -Continue trazodone 100 mg nightly as needed    --The risks/benefits/side-effects/alternatives to this medication were discussed in detail with the patient and time was given for questions. The patient consents to medication trial.  -- FDA             --  Metabolic profile and EKG monitoring obtained while on an atypical antipsychotic (BMI: Lipid Panel: HbgA1c: QTc:) as indicated             -- Encouraged patient to participate in unit milieu and in scheduled group therapies                              3. Medical Issues Being Addressed:              Tobacco Use Disorder             -- Nicotine gum prn    4. Discharge Planning:              -- Social work and case management to assist with discharge planning and identification of hospital follow-up needs prior to discharge             -- Estimated LOS: 3-4 more days additional estimated               -- Discharge Concerns: Need to establish a safety plan; Medication compliance and effectiveness             -- Discharge Goals: Return home with outpatient referrals for mental health follow-up including medication management/psychotherapy  Christoper Allegra, MD 08/31/2022, 3:08 PM  Total Time Spent in Direct Patient Care:  I personally spent 35 minutes on the unit in direct patient care. The direct patient care time included face-to-face time with the patient, reviewing the patient's chart, communicating with other professionals, and coordinating care. Greater than 50% of this time was spent in counseling or coordinating care with the patient regarding goals of hospitalization, psycho-education, and discharge planning needs.   Janine Limbo, MD Psychiatrist

## 2022-08-31 NOTE — Group Note (Signed)
Recreation Therapy Group Note   Group Topic:Health and Wellness  Group Date: 08/31/2022 Start Time: 0940 End Time: 0957 Facilitators: Ridgely Anastacio-McCall, LRT,CTRS Location: [address] Dayroom   Goal Area(s) Addresses:  Patient will define components of whole wellness. Patient will verbalize benefit of whole wellness.  Group Description:  Exercise.  LRT and patients discussed the importance of exercise.  LRT and patients then completed a short exercise session.  LRT explained to patients they would take turns leading the group in exercises of their choosing.  Patients led group in everything from push-ups, planks, jumping jacks, etc.  Patients were instructed to take breaks and get water as needed.   Affect/Mood: N/A   Participation Level: Did not attend    Clinical Observations/Individualized Feedback:     Plan: Continue to engage patient in RT group sessions 2-3x/week.   Taegan Standage-McCall, LRT,CTRS 08/31/2022 12:38 PM

## 2022-08-31 NOTE — Group Note (Signed)
Date:  08/31/2022 Time:  10:15 AM  Group Topic/Focus:  Goals Group:   The focus of this group is to help patients establish daily goals to achieve during treatment and discuss how the patient can incorporate goal setting into their daily lives to aide in recovery.    Participation Level:  Active  Participation Quality:  Appropriate  Affect:  Appropriate  Cognitive:  Appropriate  Insight: Appropriate  Engagement in Group:  Engaged  Modes of Intervention:  Activity and Education  Additional Comments:   Pt attended and actively participated in the goals group. Pt identified obtaining a new apartment as his personal goal.   Barbaraann Share 08/31/2022, 10:15 AM

## 2022-08-31 NOTE — Progress Notes (Signed)
   08/31/22 2100  Psych Admission Type (Psych Patients Only)  Admission Status Voluntary  Psychosocial Assessment  Patient Complaints Depression  Eye Contact Brief  Facial Expression Flat  Affect Appropriate to circumstance  Speech Logical/coherent  Interaction Assertive  Motor Activity Slow  Appearance/Hygiene Disheveled  Behavior Characteristics Appropriate to situation;Cooperative  Mood Depressed;Pleasant  Aggressive Behavior  Effect No apparent injury  Thought Process  Coherency WDL  Content WDL  Delusions WDL  Perception WDL  Hallucination None reported or observed  Judgment Poor  Confusion None  Danger to Self  Current suicidal ideation? Denies  Self-Injurious Behavior No self-injurious ideation or behavior indicators observed or expressed   Agreement Not to Harm Self Yes  Description of Agreement verbal  Danger to Others  Danger to Others None reported or observed

## 2022-09-01 ENCOUNTER — Other Ambulatory Visit: Payer: Self-pay

## 2022-09-01 NOTE — Group Note (Signed)
LCSW Group Therapy Note   Group Date: 09/01/2022 Start Time: 1100 End Time: 1200  Type of Therapy and Topic: Group Therapy: Effective Communication   Participation Level: Active   Description of Group:  In this group patients will be asked to identify their own styles of communication as well as defining and identifying passive, assertive, and aggressive styles of communication. Participants will identify strategies to communicate in a more assertive manner in an effort to appropriately meet their needs. This group will be process-oriented, with patients participating in exploration of their own experiences as well as giving and receiving support and challenge from other group members.   Therapeutic Goals: 1. Patient will identify their personal communication style. 2. Patient will identify passive, assertive, and aggressive forms of communication. 3. Patient will identify strategies for developing more effective communication to appropriately meet their needs.    Summary of Patient Progress: The Pt attended group and remained there the entire time. The Pt accepted all worksheets and materials that were provided.  The Pt was appropriate with their peers and staff.  The Pt demonstrated understanding of the topic being discussed by asking questions and sharing their thoughts and feelings with their peers and staff.      Therapeutic Modalities:  Communication Skills Solution Focused Therapy Motivational Interviewing  Darleen Crocker, [place] 09/01/2022  1:09 PM

## 2022-09-01 NOTE — Progress Notes (Signed)
D: Pt denied SI/HI/AVH this morning. Pt rated his depression a 8/10, anxiety a 9/10, and feelings of hopelessness a 7/10. Pt has been on room lockout for groups and meals throughout the day per MD order. Pt seen interacting well on the unit throughout the day. Pt has been pleasant, calm, and cooperative throughout the shift.   A: RN provided support and encouragement to patient. Pt given scheduled medications as prescribed. Q15 min checks verified for safety.    R: Patient verbally contracts for safety. Patient compliant with medications and treatment plan. Patient is interacting well on the unit. Pt is safe on the unit.   09/01/22 0900  Psych Admission Type (Psych Patients Only)  Admission Status Voluntary  Psychosocial Assessment  Patient Complaints Anxiety;Depression (Pt rates his anxiety a 9/10 and his depression a 8/10)  Eye Contact Brief  Facial Expression Flat  Affect Appropriate to circumstance  Speech Logical/coherent  Interaction Assertive  Motor Activity Slow  Appearance/Hygiene Disheveled  Behavior Characteristics Appropriate to situation;Cooperative  Mood Pleasant;Anxious;Depressed  Thought Process  Coherency WDL  Content WDL  Delusions None reported or observed  Perception WDL  Hallucination None reported or observed  Judgment Poor  Confusion None  Danger to Self  Current suicidal ideation? Denies  Self-Injurious Behavior No self-injurious ideation or behavior indicators observed or expressed   Agreement Not to Harm Self Yes  Description of Agreement Verbal  Danger to Others  Danger to Others None reported or observed

## 2022-09-01 NOTE — Group Note (Signed)
Recreation Therapy Group Note   Group Topic:Animal Assisted Therapy   Group Date: 09/01/2022 Start Time: [id number] End Time: 1033 Facilitators: Harpreet Signore-McCall, LRT,CTRS Location: 300 Hall Dayroom   Animal-Assisted Activity (AAA) Program Checklist/Progress Notes Patient Eligibility Criteria Checklist & Daily Group note for Rec Tx Intervention  AAA/T Program Assumption of Risk Form signed by Patient/ or Parent Legal Guardian Yes  Patient is free of allergies or severe asthma Yes  Patient reports no fear of animals Yes  Patient reports no history of cruelty to animals Yes  Patient understands his/her participation is voluntary Yes  Patient washes hands before animal contact Yes  Patient washes hands after animal contact Yes   Affect/Mood: Appropriate   Participation Level: Engaged   Participation Quality: Independent   Behavior: Appropriate    Clinical Observations/Individualized Feedback:  Patient attended session and interacted appropriately with therapy dog and peers. Patient asked appropriate questions about therapy dog and his training. Patient shared stories about their pets at home with group.    Plan: Continue to engage patient in RT group sessions 2-3x/week.   Alexandrea Westergard-McCall, LRT,CTRS 09/01/2022 1:16 PM

## 2022-09-01 NOTE — Progress Notes (Signed)
   09/01/22 0610  15 Minute Checks  Location Bedroom  Visual Appearance Calm  Behavior Composed  Sleep (Behavioral Health Patients Only)  Calculate sleep? (Click Yes once per 24 hr at 0600 safety check) Yes  Documented sleep last 24 hours 7.5

## 2022-09-01 NOTE — Progress Notes (Signed)
Psychoeducational Group Note  Date:  09/01/2022 Time:  2058  Group Topic/Focus:  Wrap-Up Group:   The focus of this group is to help patients review their daily goal of treatment and discuss progress on daily workbooks.  Participation Level: Did Not Attend  Participation Quality:  Not Applicable  Affect:  Not Applicable  Cognitive:  Not Applicable  Insight:  Not Applicable  Engagement in Group: Not Applicable  Additional Comments:  The patient did not attend group this evening.   Archie Balboa S 09/01/2022, 8:58 PM

## 2022-09-01 NOTE — Progress Notes (Signed)
[facility] MD Progress Note  09/01/2022 11:12 AM Providence Lanius.  MRN:  [id number]  Subjective:   The patient is a 32 year old Caucasian male who was admitted through the ED on a voluntary basis with symptoms of depression and suicidal ideations.  Apparently had relapsed on alcohol and cocaine for the last 3 to 4 months.  He was having persistent thoughts of wanting to cut his wrist and was also having some command hallucinations for which he went to the ED on a voluntary basis.  From the [facility] ED the patient was sent to [facility] behavioral health for further treatment.  The patient is admitted on a voluntary basis.   On my assessment today, the patient reports that his mood is less depressed compared to yesterday.  Reports his sleep has improved as well, and reports he had not experienced any terminal insomnia last night.  Reports appetite is better.  Per nursing the patient continues to isolate to room.  We will start him walk out for groups and meals to encourage the patient to receive some benefit from the therapeutic milieu.  He otherwise denies side effects from current medications.  He denies any SI or HI.  We discussed discharge planning.  He reports that he is able to return home to live with his mother at the earliest on Thursday, 314.  He reports that if he was discharged before that day, he would have "to return to where I was living before (a self-described crack house) and relapse".   I attempted to call the patient's mother during the interview with the patient today on speakerphone, [place] [phone], to discuss discharge planning, any concerns of discharge, and discharge time line, that she did not answer, and at the request of the patient I left a voicemail.    Principal Problem: Severe bipolar I disorder, most recent episode depressed ([place]) Diagnosis: Principal Problem:   Severe bipolar I disorder, most recent episode depressed ([place]) Active Problems:   Cocaine abuse ([place])    Hallucinations  Total Time spent with patient: 15 minutes  Past Psychiatric History: He self-reports patient has multiple hospitalizations both in [place] and here in [place].  His most recent hospitalization was in December 2023 to [facility].   Past Medical History:  Past Medical History:  Diagnosis Date   Bipolar 1 disorder ([place])     Past Surgical History:  Procedure Laterality Date   BRAIN SURGERY     Family History: History reviewed. No pertinent family history. Family Psychiatric  History: See H&P Social History:  Social History   Substance and Sexual Activity  Alcohol Use Yes   Alcohol/week: 12.0 standard drinks of alcohol   Types: 12 Cans of beer per week   Comment: 12 beers per day     Social History   Substance and Sexual Activity  Drug Use Yes   Types: Marijuana, Cocaine, "Crack" cocaine   Comment: last use cocaine was 08/25/22    Social History   Socioeconomic History   Marital status: Single    Spouse name: Not on file   Number of children: Not on file   Years of education: Not on file   Highest education level: Not on file  Occupational History   Not on file  Tobacco Use   Smoking status: Every Day    Packs/day: 1.00    Years: 20.00    Total pack years: 20.00    Types: Cigarettes   Smokeless tobacco: Never  Substance and Sexual  Activity   Alcohol use: Yes    Alcohol/week: 12.0 standard drinks of alcohol    Types: 12 Cans of beer per week    Comment: 12 beers per day   Drug use: Yes    Types: Marijuana, Cocaine, "Crack" cocaine    Comment: last use cocaine was 08/25/22   Sexual activity: Yes    Birth control/protection: Condom  Other Topics Concern   Not on file  Social History Narrative   Not on file   Social Determinants of Health   Financial Resource Strain: Not on file  Food Insecurity: No Food Insecurity (08/27/2022)   Hunger Vital Sign    Worried About Running Out of Food in the Last Year: Never true    Ran Out of Food in the Last  Year: Never true  Transportation Needs: No Transportation Needs (08/27/2022)   PRAPARE - Hydrologist (Medical): No    Lack of Transportation (Non-Medical): No  Physical Activity: Not on file  Stress: Not on file  Social Connections: Not on file   Additional Social History:                         Sleep: Fair  Appetite:  Fair  Current Medications: Current Facility-Administered Medications  Medication Dose Route Frequency Provider Last Rate Last Admin   acetaminophen (TYLENOL) tablet 650 mg  650 mg Oral Q6H PRN Bennett, Christal H, NP   650 mg at 08/30/22 2118   alum & mag hydroxide-simeth (MAALOX/MYLANTA) 200-200-20 MG/5ML suspension 30 mL  30 mL Oral Q4H PRN Bennett, Christal H, NP       ARIPiprazole (ABILIFY) tablet 10 mg  10 mg Oral Daily Bennett, Christal H, NP   10 mg at 09/01/22 0733   divalproex (DEPAKOTE ER) 24 hr tablet 1,000 mg  1,000 mg Oral QHS Corky Sox, MD   1,000 mg at 08/31/22 2120   DULoxetine (CYMBALTA) DR capsule 30 mg  30 mg Oral Daily Ranae Palms, MD   30 mg at 09/01/22 0734   influenza vac split quadrivalent PF (FLUARIX) injection 0.5 mL  0.5 mL Intramuscular Tomorrow-1000 Evette Georges, NP       magnesium hydroxide (MILK OF MAGNESIA) suspension 30 mL  30 mL Oral Daily PRN Bennett, Christal H, NP       multivitamin with minerals tablet 1 tablet  1 tablet Oral Daily Evette Georges, NP   1 tablet at 09/01/22 0734   nicotine polacrilex (NICORETTE) gum 2 mg  2 mg Oral PRN Bobbitt, Shalon E, NP   2 mg at 09/01/22 0835   thiamine (Vitamin B-1) tablet 100 mg  100 mg Oral Daily Evette Georges, NP   100 mg at 09/01/22 0734   traZODone (DESYREL) tablet 100 mg  100 mg Oral QHS PRN Corky Sox, MD   100 mg at 08/31/22 2121    Lab Results:  No results found for this or any previous visit (from the past 48 hour(s)).   Blood Alcohol level:  Lab Results  Component Value Date   ETH <10 08/26/2022   ETH <10 [id number]     Metabolic Disorder Labs: No results found for: "HGBA1C", "MPG" No results found for: "PROLACTIN" No results found for: "CHOL", "TRIG", "HDL", "CHOLHDL", "VLDL", "LDLCALC"  Physical Findings: AIMS:  , ,  ,  ,    CIWA:  CIWA-Ar Total: 1 COWS:     Musculoskeletal: Strength & Muscle Tone: within normal limits Gait &  Station: normal Patient leans: N/A  Psychiatric Specialty Exam:  Presentation  General Appearance:  Casual  Eye Contact: Fair  Speech: Normal Rate  Speech Volume: Decreased  Handedness: Right   Mood and Affect  Mood: Depressed  Affect: Depressed; Constricted   Thought Process  Thought Processes: Linear  Descriptions of Associations:Intact  Orientation:Full (Time, Place and Person)  Thought Content:Logical  History of Schizophrenia/Schizoaffective disorder:No  Duration of Psychotic Symptoms:Less than six months  Hallucinations:Hallucinations: None  Ideas of Reference:None  Suicidal Thoughts:Suicidal Thoughts: No  Homicidal Thoughts:Homicidal Thoughts: No   Sensorium  Memory: Immediate Fair; Recent Fair; Remote Fair  Judgment: Intact  Insight: Fair   Community education officer  Concentration: Fair  Attention Span: Fair  Recall: Good  Fund of Knowledge: Fair  Language: Good   Psychomotor Activity  Psychomotor Activity: Psychomotor Activity: Decreased   Assets  Assets: Desire for Improvement; Communication Skills; Social Support   Sleep  Sleep: Sleep: Poor    Physical Exam: Physical Exam Vitals reviewed.  Pulmonary:     Effort: Pulmonary effort is normal.  Neurological:     Mental Status: He is alert.     Motor: No weakness.     Gait: Gait normal.    Review of Systems  Neurological:  Negative for dizziness, tingling, tremors and headaches.  Psychiatric/Behavioral:  Positive for depression. The patient is nervous/anxious and has insomnia.    Blood pressure 112/80, pulse 79, temperature 97.6  F (36.4 C), temperature source Oral, resp. rate 20, height '6\' 2"'$  (1.88 m), weight 106 kg, SpO2 98 %. Body mass index is 29.99 kg/m.   Treatment Plan Summary: Daily contact with patient to assess and evaluate symptoms and progress in treatment and Medication management  Assessment: -Bipolar disorder, current episode is depressed -Alcohol use -Cocaine use  PLAN: Safety and Monitoring:             --  Voluntary admission to inpatient psychiatric unit for safety, stabilization and treatment             -- Daily contact with patient to assess and evaluate symptoms and progress in treatment             -- Patient's case to be discussed in multi-disciplinary team meeting             -- Observation Level : q15 minute checks             -- Vital signs:  q12 hours             -- Precautions: suicide, elopement, and assault   2. Psychiatric Diagnoses and Treatment:               -Continue Abilify 10 mg once daily -Continue Depakote ER 1000 mg nightly (change from twice daily dosing to QHS only dosing on 3-10) -Continue Cymbalta 30 mg once daily -Continue trazodone 100 mg nightly as needed    --The risks/benefits/side-effects/alternatives to this medication were discussed in detail with the patient and time was given for questions. The patient consents to medication trial.  -- FDA             -- Metabolic profile and EKG monitoring obtained while on an atypical antipsychotic (BMI: Lipid Panel: HbgA1c: QTc:) as indicated             -- Encouraged patient to participate in unit milieu and in scheduled group therapies  3. Medical Issues Being Addressed:              Tobacco Use Disorder             -- Nicotine gum prn    4. Discharge Planning:              -- Social work and case management to assist with discharge planning and identification of hospital follow-up needs prior to discharge             -- Estimated LOS: Thursday 09-03-22                --  Discharge Concerns: Need to establish a safety plan; Medication compliance and effectiveness             -- Discharge Goals: Return home with outpatient referrals for mental health follow-up including medication management/psychotherapy  Christoper Allegra, MD 09/01/2022, 11:12 AM  Total Time Spent in Direct Patient Care:  I personally spent 30 minutes on the unit in direct patient care. The direct patient care time included face-to-face time with the patient, reviewing the patient's chart, communicating with other professionals, and coordinating care. Greater than 50% of this time was spent in counseling or coordinating care with the patient regarding goals of hospitalization, psycho-education, and discharge planning needs.   Janine Limbo, MD Psychiatrist

## 2022-09-01 NOTE — Progress Notes (Signed)
   09/01/22 2300  Psych Admission Type (Psych Patients Only)  Admission Status Voluntary  Psychosocial Assessment  Patient Complaints Depression;Restlessness  Eye Contact Brief  Facial Expression Flat  Affect Depressed  Speech Logical/coherent  Interaction Minimal  Motor Activity Slow  Appearance/Hygiene Disheveled  Behavior Characteristics Cooperative;Appropriate to situation;Restless  Mood Depressed;Pleasant  Thought Process  Coherency WDL  Content WDL  Delusions None reported or observed  Perception WDL  Hallucination None reported or observed  Judgment Poor  Confusion None  Danger to Self  Current suicidal ideation? Denies  Self-Injurious Behavior No self-injurious ideation or behavior indicators observed or expressed   Agreement Not to Harm Self Yes  Description of Agreement verbal  Danger to Others  Danger to Others None reported or observed

## 2022-09-02 ENCOUNTER — Other Ambulatory Visit: Payer: Self-pay

## 2022-09-02 ENCOUNTER — Encounter (HOSPITAL_COMMUNITY): Payer: Self-pay

## 2022-09-02 LAB — LIPID PANEL
Cholesterol: 186 mg/dL (ref 0–200)
HDL: 35 mg/dL — ABNORMAL LOW (ref >40)
LDL Cholesterol: 132 mg/dL — ABNORMAL HIGH (ref 0–99)
Total CHOL/HDL Ratio: 5.3 RATIO
Triglycerides: 94 mg/dL (ref <150)
VLDL: 19 mg/dL (ref 0–40)

## 2022-09-02 LAB — HEMOGLOBIN A1C
Hgb A1c MFr Bld: 5.2 % (ref 4.8–5.6)
Mean Plasma Glucose: 103 mg/dL

## 2022-09-02 MED ORDER — TRAZODONE HCL 150 MG PO TABS
150.0000 mg | ORAL_TABLET | Freq: Every evening | ORAL | Status: DC | PRN
  Administered 2022-09-02 – 2022-09-03 (×2): 150 mg via ORAL
  Filled 2022-09-02 (×2): qty 1
  Filled 2022-09-02: qty 7

## 2022-09-02 NOTE — BH IP Treatment Plan (Signed)
Interdisciplinary Treatment and Diagnostic Plan Update  09/02/2022 Time of Session: 10:15am  Cody Young. MRN: [id number]  Principal Diagnosis: Severe bipolar I disorder, most recent episode depressed (Cody Young)  Secondary Diagnoses: Principal Problem:   Severe bipolar I disorder, most recent episode depressed (Cody Young) Active Problems:   Cocaine abuse (Cody Young)   Hallucinations   Current Medications:  Current Facility-Administered Medications  Medication Dose Route Frequency Provider Last Rate Last Admin   acetaminophen (TYLENOL) tablet 650 mg  650 mg Oral Q6H PRN Bennett, Christal H, NP   650 mg at 08/30/22 2118   alum & mag hydroxide-simeth (MAALOX/MYLANTA) 200-200-20 MG/5ML suspension 30 mL  30 mL Oral Q4H PRN Bennett, Christal H, NP       ARIPiprazole (ABILIFY) tablet 10 mg  10 mg Oral Daily Bennett, Christal H, NP   10 mg at 09/02/22 0754   divalproex (DEPAKOTE ER) 24 hr tablet 1,000 mg  1,000 mg Oral QHS Cody Sox, MD   1,000 mg at 09/01/22 2052   DULoxetine (CYMBALTA) DR capsule 30 mg  30 mg Oral Daily Cody Palms, MD   30 mg at 09/02/22 0754   magnesium hydroxide (MILK OF MAGNESIA) suspension 30 mL  30 mL Oral Daily PRN Bennett, Christal H, NP       multivitamin with minerals tablet 1 tablet  1 tablet Oral Daily Cody Georges, NP   1 tablet at 09/02/22 0754   nicotine polacrilex (NICORETTE) gum 2 mg  2 mg Oral PRN Cody Young, Shalon E, NP   2 mg at 09/02/22 0756   thiamine (Vitamin B-1) tablet 100 mg  100 mg Oral Daily Cody Georges, NP   100 mg at 09/02/22 0754   traZODone (DESYREL) tablet 100 mg  100 mg Oral QHS PRN Cody Sox, MD   100 mg at 09/01/22 2053   PTA Medications: Medications Prior to Admission  Medication Sig Dispense Refill Last Dose   ARIPiprazole (ABILIFY) 10 MG tablet Take 1 tablet (10 mg total) by mouth daily. 30 tablet 0    ARIPiprazole ER (ABILIFY MAINTENA) 400 MG SRER injection Inject 2 mLs (400 mg total) into the muscle every 28 (twenty-eight)  days. 1 each 1    clonazePAM (KLONOPIN) 1 MG disintegrating tablet Take 1 tablet (1 mg total) by mouth at bedtime. (Patient not taking: Reported on 08/26/2022) 30 tablet 1    divalproex (DEPAKOTE) 500 MG DR tablet Take 1 tablet (500 mg total) by mouth 2 (two) times daily. 60 tablet 0    loperamide (IMODIUM A-D) 2 MG tablet Take 1 tablet (2 mg total) by mouth 4 (four) times daily as needed for diarrhea or loose stools. 30 tablet 0    nicotine (NICODERM CQ - DOSED IN MG/24 HOURS) 14 mg/24hr patch Place 1 patch (14 mg total) onto the skin daily. 14 patch 0    ondansetron (ZOFRAN) 4 MG tablet Take 1 tablet (4 mg total) by mouth daily as needed for nausea or vomiting. 30 tablet 1     Patient Stressors: Health problems   Medication change or noncompliance   Substance abuse    Patient Strengths: Psychologist, clinical for treatment/growth   Treatment Modalities: Medication Management, Group therapy, Case management,  1 to 1 session with clinician, Psychoeducation, Recreational therapy.   Physician Treatment Plan for Primary Diagnosis: Severe bipolar I disorder, most recent episode depressed ([name]) Long Term Goal(s): Improvement in symptoms so as ready for discharge   Short Term Goals: Ability to identify changes in  lifestyle to reduce recurrence of condition will improve Ability to disclose and discuss suicidal ideas Ability to identify and develop effective coping behaviors will improve Compliance with prescribed medications will improve Ability to demonstrate self-control will improve  Medication Management: Evaluate patient's response, side effects, and tolerance of medication regimen.  Therapeutic Interventions: 1 to 1 sessions, Unit Group sessions and Medication administration.  Evaluation of Outcomes: Progressing  Physician Treatment Plan for Secondary Diagnosis: Principal Problem:   Severe bipolar I disorder, most recent episode depressed ([name]) Active Problems:    Cocaine abuse (Cody Young)   Hallucinations  Long Term Goal(s): Improvement in symptoms so as ready for discharge   Short Term Goals: Ability to identify changes in lifestyle to reduce recurrence of condition will improve Ability to disclose and discuss suicidal ideas Ability to identify and develop effective coping behaviors will improve Compliance with prescribed medications will improve Ability to demonstrate self-control will improve     Medication Management: Evaluate patient's response, side effects, and tolerance of medication regimen.  Therapeutic Interventions: 1 to 1 sessions, Unit Group sessions and Medication administration.  Evaluation of Outcomes: Progressing   RN Treatment Plan for Primary Diagnosis: Severe bipolar I disorder, most recent episode depressed (Cody Young) Long Term Goal(s): Knowledge of disease and therapeutic regimen to maintain health will improve  Short Term Goals: Ability to remain free from injury will improve, Ability to participate in decision making will improve, Ability to verbalize feelings will improve, Ability to disclose and discuss suicidal ideas, and Ability to identify and develop effective coping behaviors will improve  Medication Management: RN will administer medications as ordered by provider, will assess and evaluate patient's response and provide education to patient for prescribed medication. RN will report any adverse and/or side effects to prescribing provider.  Therapeutic Interventions: 1 on 1 counseling sessions, Psychoeducation, Medication administration, Evaluate responses to treatment, Monitor vital signs and CBGs as ordered, Perform/monitor CIWA, COWS, AIMS and Fall Risk screenings as ordered, Perform wound care treatments as ordered.  Evaluation of Outcomes: Progressing   LCSW Treatment Plan for Primary Diagnosis: Severe bipolar I disorder, most recent episode depressed (Cody Young) Long Term Goal(s): Safe transition to appropriate next level of  care at discharge, Engage patient in therapeutic group addressing interpersonal concerns.  Short Term Goals: Engage patient in aftercare planning with referrals and resources, Increase social support, Increase emotional regulation, Facilitate acceptance of mental health diagnosis and concerns, Identify triggers associated with mental health/substance abuse issues, and Increase skills for wellness and recovery  Therapeutic Interventions: Assess for all discharge needs, 1 to 1 time with Social worker, Explore available resources and support systems, Assess for adequacy in community support network, Educate family and significant other(s) on suicide prevention, Complete Psychosocial Assessment, Interpersonal group therapy.  Evaluation of Outcomes: Progressing   Progress in Treatment: Attending groups: Yes. Participating in groups: Yes. Taking medication as prescribed: Yes. Toleration medication: Yes. Family/Significant other contact made: Yes, individual(s) contacted:  Binyamin Klym [phone] [id number] (Mother) Patient understands diagnosis: Yes. Discussing patient identified problems/goals with staff: Yes. Medical problems stabilized or resolved: Yes. Denies suicidal/homicidal ideation: Yes. Issues/concerns per patient self-inventory: Yes. Other:    New problem(s) identified: No, Describe:  None Reported   New Short Term/Long Term Goal(s): ): medication stabilization, elimination of SI thoughts, development of comprehensive mental wellness plan.      Patient Goals:  Detox, Medication Stabilization   Discharge Plan or Barriers: Patient recently admitted. CSW will continue to follow and assess for appropriate referrals and possible discharge planning.  Reason for Continuation of Hospitalization: Depression Hallucinations Medication stabilization Suicidal ideation   Estimated Length of Stay: 3-7 Days  Last [place] Suicide Severity Risk Score: [place] Admission (Current)  from 08/26/2022 in [facility] Most recent reading at 08/26/2022  8:49 PM ED from 08/26/2022 in [facility] Emergency Department at [facility] Most recent reading at 08/26/2022 11:02 AM ED from 08/25/2022 in [facility] Emergency Department at [facility] Most recent reading at 08/25/2022  8:50 PM  C-SSRS RISK CATEGORY Low Risk Low Risk No Risk       Last PHQ 2/9 Scores:     No data to display          Scribe for Treatment Team: Darleen Crocker, Latanya Presser 09/02/2022 11:25 AM

## 2022-09-02 NOTE — BHH Group Notes (Signed)
[place] Group Notes:  (Nursing/MHT/Case Management/Adjunct)  Date:  09/02/2022  Time:  9:26 PM  Type of Therapy:  Group Therapy  Participation Level:  Active  Participation Quality:  Appropriate  Affect:  Appropriate  Cognitive:  Appropriate  Insight:  Appropriate  Engagement in Group:  Engaged  Modes of Intervention:  Education  Summary of Progress/Problems:Participated in anger management exercise. Reports goal to stay positive. Day 8/10.  Orvan Falconer 09/02/2022, 9:26 PM

## 2022-09-02 NOTE — BHH Group Notes (Signed)
[place] Group Notes:  (Nursing/MHT/Case Management/Adjunct)  Date:  09/02/2022  Time:  9:19 AM  Type of Therapy:  Group Topic/ Focus: Goals Group: The focus of this group is to help patients establish daily goals to achieve during treatment and discuss how the patient can incorporate goal setting into their daily lives to aide in recovery.   Participation Level:  Active  Participation Quality:  Appropriate  Affect:  Appropriate  Cognitive:  Appropriate  Insight:  Appropriate  Engagement in Group:  Engaged  Modes of Intervention:  Discussion  Summary of Progress/Problems:  Patient attended and participated in orientation/ goals group today. Patient's goal for today is to stay positive and safe when he discharges.   Elza Rafter 09/02/2022, 9:19 AM

## 2022-09-02 NOTE — Progress Notes (Addendum)
Pt denied SI/HI/AVH this morning. Pt presents with a depressed/ flat affect. Pt reports that he woke up at 4:30am last night and was unable to fall back asleep. Pt given scheduled medications as prescribed. Q15 min checks verified for safety. Patient compliant with medications and treatment plan. Patient is interacting well on the unit. Pt is safe on the unit.   09/02/22 0900  Psych Admission Type (Psych Patients Only)  Admission Status Voluntary  Psychosocial Assessment  Patient Complaints Sleep disturbance  Eye Contact Brief  Facial Expression Flat  Affect Depressed  Speech Logical/coherent  Interaction Minimal  Motor Activity Slow  Appearance/Hygiene Disheveled  Behavior Characteristics Cooperative  Mood Depressed  Thought Process  Coherency WDL  Content WDL  Delusions None reported or observed  Perception WDL  Hallucination None reported or observed  Judgment Impaired  Confusion None  Danger to Self  Current suicidal ideation? Denies  Self-Injurious Behavior No self-injurious ideation or behavior indicators observed or expressed   Agreement Not to Harm Self Yes  Description of Agreement Pt verbally contracts for safety  Danger to Others  Danger to Others None reported or observed

## 2022-09-02 NOTE — Progress Notes (Signed)
[facility] MD Progress Note  09/02/2022 2:35 PM Providence Lanius.  MRN:  [id number]  Subjective:   The patient is a 32 year old Caucasian male who was admitted through the ED on a voluntary basis with symptoms of depression and suicidal ideations.  Apparently had relapsed on alcohol and cocaine for the last 3 to 4 months.  He was having persistent thoughts of wanting to cut his wrist and was also having some command hallucinations for which he went to the ED on a voluntary basis.  From the [facility] ED the patient was sent to [facility] behavioral health for further treatment.  The patient is admitted on a voluntary basis.   On my assessment today, the patient reports that his mood is much improved. He reports that sleep was worse last night, with terminal insomnia (waking up around 4am). Reports appetite is stable. He otherwise denies side effects from current medications.  He denies any SI or HI.  We again discussed discharge planning.  He reports that he is able to return home to live with his mother at the earliest tomorrow, 3/14. Per SW, safety planning has been done with pt's mother for tomorrow, Thursday 3/14.      Principal Problem: Severe bipolar I disorder, most recent episode depressed ([place]) Diagnosis: Principal Problem:   Severe bipolar I disorder, most recent episode depressed ([place]) Active Problems:   Cocaine abuse ([place])   Hallucinations  Total Time spent with patient: 15 minutes  Past Psychiatric History: He self-reports patient has multiple hospitalizations both in [place] and here in [place].  His most recent hospitalization was in December 2023 to [facility].   Past Medical History:  Past Medical History:  Diagnosis Date   Bipolar 1 disorder ([place])     Past Surgical History:  Procedure Laterality Date   BRAIN SURGERY     Family History: History reviewed. No pertinent family history. Family Psychiatric  History: See H&P Social History:  Social History   Substance and Sexual  Activity  Alcohol Use Yes   Alcohol/week: 12.0 standard drinks of alcohol   Types: 12 Cans of beer per week   Comment: 12 beers per day     Social History   Substance and Sexual Activity  Drug Use Yes   Types: Marijuana, Cocaine, "Crack" cocaine   Comment: last use cocaine was 08/25/22    Social History   Socioeconomic History   Marital status: Single    Spouse name: Not on file   Number of children: Not on file   Years of education: Not on file   Highest education level: Not on file  Occupational History   Not on file  Tobacco Use   Smoking status: Every Day    Packs/day: 1.00    Years: 20.00    Total pack years: 20.00    Types: Cigarettes   Smokeless tobacco: Never  Substance and Sexual Activity   Alcohol use: Yes    Alcohol/week: 12.0 standard drinks of alcohol    Types: 12 Cans of beer per week    Comment: 12 beers per day   Drug use: Yes    Types: Marijuana, Cocaine, "Crack" cocaine    Comment: last use cocaine was 08/25/22   Sexual activity: Yes    Birth control/protection: Condom  Other Topics Concern   Not on file  Social History Narrative   Not on file   Social Determinants of Health   Financial Resource Strain: Not on file  Food Insecurity: No Food  Insecurity (08/27/2022)   Hunger Vital Sign    Worried About Running Out of Food in the Last Year: Never true    Ran Out of Food in the Last Year: Never true  Transportation Needs: No Transportation Needs (08/27/2022)   PRAPARE - Hydrologist (Medical): No    Lack of Transportation (Non-Medical): No  Physical Activity: Not on file  Stress: Not on file  Social Connections: Not on file   Additional Social History:                         Sleep: Fair  Appetite:  Fair  Current Medications: Current Facility-Administered Medications  Medication Dose Route Frequency Provider Last Rate Last Admin   acetaminophen (TYLENOL) tablet 650 mg  650 mg Oral Q6H PRN Richardson Landry,  Christal H, NP   650 mg at 08/30/22 2118   alum & mag hydroxide-simeth (MAALOX/MYLANTA) 200-200-20 MG/5ML suspension 30 mL  30 mL Oral Q4H PRN Bennett, Christal H, NP       ARIPiprazole (ABILIFY) tablet 10 mg  10 mg Oral Daily Bennett, Christal H, NP   10 mg at 09/02/22 0754   divalproex (DEPAKOTE ER) 24 hr tablet 1,000 mg  1,000 mg Oral QHS Corky Sox, MD   1,000 mg at 09/01/22 2052   DULoxetine (CYMBALTA) DR capsule 30 mg  30 mg Oral Daily Ranae Palms, MD   30 mg at 09/02/22 0754   magnesium hydroxide (MILK OF MAGNESIA) suspension 30 mL  30 mL Oral Daily PRN Richardson Landry, Christal H, NP       multivitamin with minerals tablet 1 tablet  1 tablet Oral Daily Evette Georges, NP   1 tablet at 09/02/22 0754   nicotine polacrilex (NICORETTE) gum 2 mg  2 mg Oral PRN Bobbitt, Shalon E, NP   2 mg at 09/02/22 1251   thiamine (Vitamin B-1) tablet 100 mg  100 mg Oral Daily Evette Georges, NP   100 mg at 09/02/22 0754   traZODone (DESYREL) tablet 100 mg  100 mg Oral QHS PRN Corky Sox, MD   100 mg at 09/01/22 2053    Lab Results:  Results for orders placed or performed during the hospital encounter of 08/26/22 (from the past 48 hour(s))  Lipid panel     Status: Abnormal   Collection Time: 09/02/22  6:31 AM  Result Value Ref Range   Cholesterol 186 0 - 200 mg/dL   Triglycerides 94 <150 mg/dL   HDL 35 (L) >40 mg/dL   Total CHOL/HDL Ratio 5.3 RATIO   VLDL 19 0 - 40 mg/dL   LDL Cholesterol 132 (H) 0 - 99 mg/dL    Comment:        Total Cholesterol/HDL:CHD Risk Coronary Heart Disease Risk Table                     Men   Women  1/2 Average Risk   3.4   3.3  Average Risk       5.0   4.4  2 X Average Risk   9.6   7.1  3 X Average Risk  23.4   11.0        Use the calculated Patient Ratio above and the CHD Risk Table to determine the patient's CHD Risk.        ATP III CLASSIFICATION (LDL):  <100     mg/dL   Optimal  100-129  mg/dL   Near  or Above                    Optimal  130-159  mg/dL    Borderline  160-189  mg/dL   High  >190     mg/dL   Very High Performed at [facility] [address] [place], [place] [address]      Blood Alcohol level:  Lab Results  Component Value Date   ETH <10 08/26/2022   ETH <10 [id number]    Metabolic Disorder Labs: No results found for: "HGBA1C", "MPG" No results found for: "PROLACTIN" Lab Results  Component Value Date   CHOL 186 09/02/2022   TRIG 94 09/02/2022   HDL 35 (L) 09/02/2022   CHOLHDL 5.3 09/02/2022   VLDL 19 09/02/2022   LDLCALC 132 (H) 09/02/2022    Physical Findings: AIMS:  , ,  ,  ,    CIWA:  CIWA-Ar Total: 1 COWS:     Musculoskeletal: Strength & Muscle Tone: within normal limits Gait & Station: normal Patient leans: N/A  Psychiatric Specialty Exam:  Presentation  General Appearance:  Casual  Eye Contact: Fair  Speech: Normal Rate  Speech Volume: Decreased  Handedness: Right   Mood and Affect  Mood: Euthymic   Affect: Constricted   Thought Process  Thought Processes: Linear  Descriptions of Associations:Intact  Orientation:Full (Time, Place and Person)  Thought Content:Logical  History of Schizophrenia/Schizoaffective disorder:No  Duration of Psychotic Symptoms:Less than six months  Hallucinations:No data recorded DENIES  Ideas of Reference:None  Suicidal Thoughts:No data recorded DENIES  Homicidal Thoughts:No data recorded DENIES   Sensorium  Memory: Immediate Fair; Recent Fair; Remote Fair  Judgment: Intact  Insight: Fair   Community education officer  Concentration: Fair  Attention Span: Fair  Recall: Good  Fund of Knowledge: Fair  Language: Good   Psychomotor Activity  Psychomotor Activity: No data recorded NML   Assets  Assets: Desire for Improvement; Communication Skills; Social Support   Sleep  Sleep: No data recorded APPETITE   Physical Exam: Physical Exam Vitals reviewed.  Pulmonary:     Effort:  Pulmonary effort is normal.  Neurological:     Mental Status: He is alert.     Motor: No weakness.     Gait: Gait normal.    Review of Systems  Neurological:  Negative for dizziness, tingling, tremors and headaches.  Psychiatric/Behavioral:  Positive for depression. The patient is nervous/anxious and has insomnia.    Blood pressure 106/89, pulse 70, temperature 97.6 F (36.4 C), temperature source Oral, resp. rate 20, height '6\' 2"'$  (1.88 m), weight 106 kg, SpO2 100 %. Body mass index is 29.99 kg/m.   Treatment Plan Summary: Daily contact with patient to assess and evaluate symptoms and progress in treatment and Medication management  Assessment: -Bipolar disorder, current episode is depressed -Alcohol use -Cocaine use  PLAN: Safety and Monitoring:             --  Voluntary admission to inpatient psychiatric unit for safety, stabilization and treatment             -- Daily contact with patient to assess and evaluate symptoms and progress in treatment             -- Patient's case to be discussed in multi-disciplinary team meeting             -- Observation Level : q15 minute checks             -- Vital  signs:  q12 hours             -- Precautions: suicide, elopement, and assault   2. Psychiatric Diagnoses and Treatment:               -Continue Abilify 10 mg once daily -Continue Depakote ER 1000 mg nightly (change from twice daily dosing to QHS only dosing on 3-10) -Continue Cymbalta 30 mg once daily -increase trazodone from 100 mg to 150 mg nightly as needed for insomnia     --The risks/benefits/side-effects/alternatives to this medication were discussed in detail with the patient and time was given for questions. The patient consents to medication trial.  -- FDA             -- Metabolic profile and EKG monitoring obtained while on an atypical antipsychotic (BMI: Lipid Panel: HbgA1c: QTc:) as indicated             -- Encouraged patient to participate in unit milieu and in  scheduled group therapies                             3. Medical Issues Being Addressed:              Tobacco Use Disorder             -- Nicotine gum prn    4. Discharge Planning:              -- Social work and case management to assist with discharge planning and identification of hospital follow-up needs prior to discharge             -- Estimated LOS: Thursday 09-03-22                -- Discharge Concerns: Need to establish a safety plan; Medication compliance and effectiveness             -- Discharge Goals: Return home with outpatient referrals for mental health follow-up including medication management/psychotherapy  Christoper Allegra, MD 09/02/2022, 2:35 PM  Total Time Spent in Direct Patient Care:  I personally spent 35 minutes on the unit in direct patient care. The direct patient care time included face-to-face time with the patient, reviewing the patient's chart, communicating with other professionals, and coordinating care. Greater than 50% of this time was spent in counseling or coordinating care with the patient regarding goals of hospitalization, psycho-education, and discharge planning needs.   Janine Limbo, MD Psychiatrist

## 2022-09-02 NOTE — Progress Notes (Signed)
   09/02/22 2200  Psych Admission Type (Psych Patients Only)  Admission Status Voluntary  Psychosocial Assessment  Patient Complaints Sleep disturbance  Eye Contact Fair  Facial Expression Animated  Affect Depressed  Speech Logical/coherent  Interaction Minimal  Motor Activity Slow  Appearance/Hygiene Disheveled  Behavior Characteristics Cooperative  Mood Pleasant  Thought Process  Coherency WDL  Content WDL  Delusions None reported or observed  Perception WDL  Hallucination None reported or observed  Judgment Impaired  Confusion None  Danger to Self  Current suicidal ideation? Denies  Self-Injurious Behavior No self-injurious ideation or behavior indicators observed or expressed   Agreement Not to Harm Self Yes  Description of Agreement verbal  Danger to Others  Danger to Others None reported or observed

## 2022-09-02 NOTE — Progress Notes (Signed)
   09/02/22 0615  15 Minute Checks  Location Hallway  Visual Appearance Calm  Behavior Composed  Sleep (Behavioral Health Patients Only)  Calculate sleep? (Click Yes once per 24 hr at 0600 safety check) Yes  Documented sleep last 24 hours 6.75

## 2022-09-03 MED ORDER — ARIPIPRAZOLE 10 MG PO TABS: 10.0000 mg | ORAL_TABLET | Freq: Every day | ORAL | 0 refills | Status: DC

## 2022-09-03 MED ORDER — NICOTINE POLACRILEX 2 MG MT GUM: 2.0000 mg | CHEWING_GUM | OROMUCOSAL | 0 refills | Status: DC | PRN

## 2022-09-03 MED ORDER — NICOTINE 14 MG/24HR TD PT24: 14.0000 mg | MEDICATED_PATCH | Freq: Every day | TRANSDERMAL | 0 refills | Status: DC

## 2022-09-03 MED ORDER — TRAZODONE HCL 150 MG PO TABS: 150.0000 mg | ORAL_TABLET | Freq: Every evening | ORAL | 0 refills | Status: DC | PRN

## 2022-09-03 MED ORDER — DIVALPROEX SODIUM ER 500 MG PO TB24: 1000.0000 mg | ORAL_TABLET | Freq: Every day | ORAL | 0 refills | Status: DC

## 2022-09-03 MED ORDER — DULOXETINE HCL 30 MG PO CPEP: 30.0000 mg | ORAL_CAPSULE | Freq: Every day | ORAL | 0 refills | Status: DC

## 2022-09-03 NOTE — Discharge Instructions (Signed)

## 2022-09-03 NOTE — Progress Notes (Signed)
[facility] MD Progress Note  09/03/2022 9:56 AM Cody Young.  MRN:  [id number]  Subjective:   The patient is a 32 year old Caucasian male who was admitted through the ED on a voluntary basis with symptoms of depression and suicidal ideations.  Apparently had relapsed on alcohol and cocaine for the last 3 to 4 months.  He was having persistent thoughts of wanting to cut his wrist and was also having some command hallucinations for which he went to the ED on a voluntary basis.  From the [facility] ED the patient was sent to [facility] behavioral health for further treatment.  The patient is admitted on a voluntary basis.   On my assessment today, the patient reports that his mood is much improved. He reports that sleep was better last night. Reports appetite is stable. He otherwise denies side effects from current medications.  He denies any SI or HI.   We planned for dc to care of mother today 09-03-22. I put in dc orders, med rec, sra, dc summary. When SW called mother, mother ABSOLUTELY REFUSED to pickup patient today stating she was not ready for his discharge, house as not ready, and states she will pick him up tomorrow at 1pm tomorrow and will not pick him up today.  (See SW note).  Dc planning for tomorrow, 09-04-22 at 1pm.       Principal Problem: Severe bipolar I disorder, most recent episode depressed ([place]) Diagnosis: Principal Problem:   Severe bipolar I disorder, most recent episode depressed ([place]) Active Problems:   Cocaine abuse ([place])   Hallucinations  Total Time spent with patient: 15 minutes  Past Psychiatric History: He self-reports patient has multiple hospitalizations both in [place] and here in [place].  His most recent hospitalization was in December 2023 to [facility].   Past Medical History:  Past Medical History:  Diagnosis Date   Bipolar 1 disorder ([place])     Past Surgical History:  Procedure Laterality Date   BRAIN SURGERY     Family History: History reviewed. No  pertinent family history. Family Psychiatric  History: See H&P Social History:  Social History   Substance and Sexual Activity  Alcohol Use Yes   Alcohol/week: 12.0 standard drinks of alcohol   Types: 12 Cans of beer per week   Comment: 12 beers per day     Social History   Substance and Sexual Activity  Drug Use Yes   Types: Marijuana, Cocaine, "Crack" cocaine   Comment: last use cocaine was 08/25/22    Social History   Socioeconomic History   Marital status: Single    Spouse name: Not on file   Number of children: Not on file   Years of education: Not on file   Highest education level: Not on file  Occupational History   Not on file  Tobacco Use   Smoking status: Every Day    Packs/day: 1.00    Years: 20.00    Additional pack years: 0.00    Total pack years: 20.00    Types: Cigarettes   Smokeless tobacco: Never  Substance and Sexual Activity   Alcohol use: Yes    Alcohol/week: 12.0 standard drinks of alcohol    Types: 12 Cans of beer per week    Comment: 12 beers per day   Drug use: Yes    Types: Marijuana, Cocaine, "Crack" cocaine    Comment: last use cocaine was 08/25/22   Sexual activity: Yes    Birth control/protection: Condom  Other Topics Concern   Not on file  Social History Narrative   Not on file   Social Determinants of Health   Financial Resource Strain: Not on file  Food Insecurity: No Food Insecurity (08/27/2022)   Hunger Vital Sign    Worried About Running Out of Food in the Last Year: Never true    Ran Out of Food in the Last Year: Never true  Transportation Needs: No Transportation Needs (08/27/2022)   PRAPARE - Hydrologist (Medical): No    Lack of Transportation (Non-Medical): No  Physical Activity: Not on file  Stress: Not on file  Social Connections: Not on file   Additional Social History:                         Sleep: Fair  Appetite:  Fair  Current Medications: Current  Facility-Administered Medications  Medication Dose Route Frequency Provider Last Rate Last Admin   acetaminophen (TYLENOL) tablet 650 mg  650 mg Oral Q6H PRN Bennett, Christal H, NP   650 mg at 08/30/22 2118   alum & mag hydroxide-simeth (MAALOX/MYLANTA) 200-200-20 MG/5ML suspension 30 mL  30 mL Oral Q4H PRN Bennett, Christal H, NP       ARIPiprazole (ABILIFY) tablet 10 mg  10 mg Oral Daily Bennett, Christal H, NP   10 mg at 09/03/22 0747   divalproex (DEPAKOTE ER) 24 hr tablet 1,000 mg  1,000 mg Oral QHS Corky Sox, MD   1,000 mg at 09/02/22 2044   DULoxetine (CYMBALTA) DR capsule 30 mg  30 mg Oral Daily Ranae Palms, MD   30 mg at 09/03/22 0747   magnesium hydroxide (MILK OF MAGNESIA) suspension 30 mL  30 mL Oral Daily PRN Richardson Landry, Christal H, NP       multivitamin with minerals tablet 1 tablet  1 tablet Oral Daily Evette Georges, NP   1 tablet at 09/03/22 0747   nicotine polacrilex (NICORETTE) gum 2 mg  2 mg Oral PRN Bobbitt, Shalon E, NP   2 mg at 09/02/22 1251   thiamine (Vitamin B-1) tablet 100 mg  100 mg Oral Daily Evette Georges, NP   100 mg at 09/03/22 0747   traZODone (DESYREL) tablet 150 mg  150 mg Oral QHS PRN Janine Limbo, MD   150 mg at 09/02/22 2044    Lab Results:  Results for orders placed or performed during the hospital encounter of 08/26/22 (from the past 48 hour(s))  Hemoglobin A1c     Status: None   Collection Time: 09/02/22  6:31 AM  Result Value Ref Range   Hgb A1c MFr Bld 5.2 4.8 - 5.6 %    Comment: (NOTE)         Prediabetes: 5.7 - 6.4         Diabetes: >6.4         Glycemic control for adults with diabetes: <7.0    Mean Plasma Glucose 103 mg/dL    Comment: (NOTE) Performed At: [facility] [place], [place] [id number] Rush Farmer MD [id number]   Lipid panel     Status: Abnormal   Collection Time: 09/02/22  6:31 AM  Result Value Ref Range   Cholesterol 186 0 - 200 mg/dL   Triglycerides 94 <150 mg/dL   HDL 35 (L)  >40 mg/dL   Total CHOL/HDL Ratio 5.3 RATIO   VLDL 19 0 - 40 mg/dL   LDL Cholesterol 132 (H) 0 -  99 mg/dL    Comment:        Total Cholesterol/HDL:CHD Risk Coronary Heart Disease Risk Table                     Men   Women  1/2 Average Risk   3.4   3.3  Average Risk       5.0   4.4  2 X Average Risk   9.6   7.1  3 X Average Risk  23.4   11.0        Use the calculated Patient Ratio above and the CHD Risk Table to determine the patient's CHD Risk.        ATP III CLASSIFICATION (LDL):  <100     mg/dL   Optimal  100-129  mg/dL   Near or Above                    Optimal  130-159  mg/dL   Borderline  160-189  mg/dL   High  >190     mg/dL   Very High Performed at [facility] [address] [place], [place] [address]      Blood Alcohol level:  Lab Results  Component Value Date   [facility] <10 08/26/2022   ETH <10 [id number]    Metabolic Disorder Labs: Lab Results  Component Value Date   HGBA1C 5.2 09/02/2022   MPG 103 09/02/2022   No results found for: "PROLACTIN" Lab Results  Component Value Date   CHOL 186 09/02/2022   TRIG 94 09/02/2022   HDL 35 (L) 09/02/2022   CHOLHDL 5.3 09/02/2022   VLDL 19 09/02/2022   LDLCALC 132 (H) 09/02/2022    Physical Findings: AIMS:  , ,  ,  ,    CIWA:  CIWA-Ar Total: 1 COWS:     Musculoskeletal: Strength & Muscle Tone: within normal limits Gait & Station: normal Patient leans: N/A  Psychiatric Specialty Exam:  Presentation  General Appearance:  Appropriate for Environment; Casual; Fairly Groomed  Eye Contact: Good  Speech: Normal Rate; Clear and Coherent  Speech Volume: Normal  Handedness: Right   Mood and Affect  Mood: Euthymic   Affect: Constricted   Thought Process  Thought Processes: Linear  Descriptions of Associations:Intact  Orientation:Full (Time, Place and Person)  Thought Content:Logical  History of Schizophrenia/Schizoaffective disorder:No  Duration of Psychotic  Symptoms:Less than six months  Hallucinations:Hallucinations: None DENIES  Ideas of Reference:None  Suicidal Thoughts:Suicidal Thoughts: No DENIES  Homicidal Thoughts:Homicidal Thoughts: No DENIES   Sensorium  Memory: Immediate Good; Recent Good; Remote Good  Judgment: Fair  Insight: Fair   Community education officer  Concentration: Fair  Attention Span: Fair  Recall: Good  Fund of Knowledge: Good  Language: Good   Psychomotor Activity  Psychomotor Activity: Psychomotor Activity: Normal NML   Assets  Assets: Desire for Improvement; Communication Skills; Social Support   Sleep  Sleep: Sleep: Fair Better    Physical Exam: Physical Exam Vitals reviewed.  Pulmonary:     Effort: Pulmonary effort is normal.  Neurological:     Mental Status: He is alert.     Motor: No weakness.     Gait: Gait normal.    Review of Systems  Neurological:  Negative for dizziness, tingling, tremors and headaches.  Psychiatric/Behavioral:  Positive for depression. The patient is nervous/anxious and has insomnia.    Blood pressure 112/84, pulse 73, temperature (!) 97.5 F (36.4 C), temperature source Oral, resp. rate 20, height  $'6\' 2"'C$  (1.88 m), weight 106 kg, SpO2 98 %. Body mass index is 29.99 kg/m.   Treatment Plan Summary: Daily contact with patient to assess and evaluate symptoms and progress in treatment and Medication management  Assessment: -Bipolar disorder, current episode is depressed -Alcohol use -Cocaine use  PLAN: Safety and Monitoring:             --  Voluntary admission to inpatient psychiatric unit for safety, stabilization and treatment             -- Daily contact with patient to assess and evaluate symptoms and progress in treatment             -- Patient's case to be discussed in multi-disciplinary team meeting             -- Observation Level : q15 minute checks             -- Vital signs:  q12 hours             -- Precautions: suicide,  elopement, and assault   2. Psychiatric Diagnoses and Treatment:               -Continue Abilify 10 mg once daily -Continue Depakote ER 1000 mg nightly (change from twice daily dosing to QHS only dosing on 3-10) -Continue Cymbalta 30 mg once daily -increase trazodone from 100 mg to 150 mg nightly as needed for insomnia     --The risks/benefits/side-effects/alternatives to this medication were discussed in detail with the patient and time was given for questions. The patient consents to medication trial.  -- FDA             -- Metabolic profile and EKG monitoring obtained while on an atypical antipsychotic (BMI: Lipid Panel: HbgA1c: QTc:) as indicated             -- Encouraged patient to participate in unit milieu and in scheduled group therapies                             3. Medical Issues Being Addressed:              Tobacco Use Disorder             -- Nicotine gum prn    4. Discharge Planning:              -- Social work and case management to assist with discharge planning and identification of hospital follow-up needs prior to discharge             -- Estimated LOS: Friday 09-04-22                 -- Discharge Concerns: Need to establish a safety plan; Medication compliance and effectiveness             -- Discharge Goals: Return home with outpatient referrals for mental health follow-up including medication management/psychotherapy  Christoper Allegra, MD 09/03/2022, 9:56 AM  Total Time Spent in Direct Patient Care:  I personally spent 35 minutes on the unit in direct patient care. The direct patient care time included face-to-face time with the patient, reviewing the patient's chart, communicating with other professionals, and coordinating care. Greater than 50% of this time was spent in counseling or coordinating care with the patient regarding goals of hospitalization, psycho-education, and discharge planning needs.   Janine Limbo, MD Psychiatrist

## 2022-09-03 NOTE — Group Note (Signed)
LCSW Group Therapy Note   Group Date: 09/03/2022 Start Time: 1100 End Time: 1200  Type of Group and Topic: Psychoeducational Group: Discharge Planning  Participation Level: Active  Description of Group Discharge planning group reviews patient's anticipated discharge plans and assists patients to anticipate and address any barriers to wellness/recovery in the community. Suicide prevention education is reviewed with patients in group.  Therapeutic Goals 1. Patients will state their anticipated discharge plan and mental health aftercare 2. Patients will identify potential barriers to wellness in the community setting 3. Patients will engage in problem solving, solution focused discussion of ways to anticipate and address barriers to wellness/recovery  Summary of Patient Progress: The Pt attended group and remained there the entire time.  The Pt accepted all worksheets and materials provided.  The Pt was appropriate with all peers and staff.  The Pt demonstrated understanding of the topic being discussed by sharing their thoughts and feeling about discharge and sharing community resources with their peers.    Darleen Crocker, LCSWA 09/03/2022  1:35 PM

## 2022-09-03 NOTE — Discharge Summary (Addendum)
Physician Discharge Summary Note  Patient:  Cody Young is an 32 y.o., male MRN:  [id number] DOB:  December 26, 1990 Patient phone:  [phone] (home)  Patient address:   [place]  [address],  Total Time spent with patient: 15 minutes  Date of Admission:  08/26/2022 Date of Discharge: 09-04-2022  Reason for Admission:    The patient is a 32 year old Caucasian male who was admitted through the ED on a voluntary basis with symptoms of depression and suicidal ideations.  Apparently had relapsed on alcohol and cocaine for the last 3 to 4 months.  He was having persistent thoughts of wanting to cut his wrist and was also having some command hallucinations for which he went to the ED on a voluntary basis.  From the [facility] ED the patient was sent to [facility] behavioral health for further treatment.      Principal Problem: Severe bipolar I disorder, most recent episode depressed [facility]) Discharge Diagnoses: Principal Problem:   Severe bipolar I disorder, most recent episode depressed ([place]) Active Problems:   Cocaine abuse ([place])   Hallucinations   Past Psychiatric History:  self-reports patient has multiple hospitalizations both in [place] and here in [place].  His most recent hospitalization was in December 2023 to [facility].   Past Medical History:  Past Medical History:  Diagnosis Date   Bipolar 1 disorder ([place])     Past Surgical History:  Procedure Laterality Date   BRAIN SURGERY     Family History: History reviewed. No pertinent family history. Family Psychiatric  History: See H&P  Social History:  Social History   Substance and Sexual Activity  Alcohol Use Yes   Alcohol/week: 12.0 standard drinks of alcohol   Types: 12 Cans of beer per week   Comment: 12 beers per day     Social History   Substance and Sexual Activity  Drug Use Yes   Types: Marijuana, Cocaine, "Crack" cocaine   Comment: last use cocaine was 08/25/22    Social History    Socioeconomic History   Marital status: Single    Spouse name: Not on file   Number of children: Not on file   Years of education: Not on file   Highest education level: Not on file  Occupational History   Not on file  Tobacco Use   Smoking status: Every Day    Packs/day: 1.00    Years: 20.00    Additional pack years: 0.00    Total pack years: 20.00    Types: Cigarettes   Smokeless tobacco: Never  Substance and Sexual Activity   Alcohol use: Yes    Alcohol/week: 12.0 standard drinks of alcohol    Types: 12 Cans of beer per week    Comment: 12 beers per day   Drug use: Yes    Types: Marijuana, Cocaine, "Crack" cocaine    Comment: last use cocaine was 08/25/22   Sexual activity: Yes    Birth control/protection: Condom  Other Topics Concern   Not on file  Social History Narrative   Not on file   Social Determinants of Health   Financial Resource Strain: Not on file  Food Insecurity: No Food Insecurity (08/27/2022)   Hunger Vital Sign    Worried About Running Out of Food in the Last Year: Never true    Ran Out of Food in the Last Year: Never true  Transportation Needs: No Transportation Needs (08/27/2022)   PRAPARE - Transportation    Lack of  Transportation (Medical): No    Lack of Transportation (Non-Medical): No  Physical Activity: Not on file  Stress: Not on file  Social Connections: Not on file    Hospital Course:     During the patient's hospitalization, patient had extensive initial psychiatric evaluation, and follow-up psychiatric evaluations every day.  Psychiatric diagnoses provided upon initial assessment:  -Bipolar disorder, current episode is depressed -Alcohol use -Cocaine use  Patient's psychiatric medications were adjusted on admission:  -restart abilify 10 mg once daily -restart depakote ER 500 mg bid   During the hospitalization, other adjustments were made to the patient's psychiatric medication regimen:  -abilify 10 mg once daily was  continued -depakote was changed to ER 100 mg at bedtime only -cymbalta 30 mg once daily was started   Patient's care was discussed during the interdisciplinary team meeting every day during the hospitalization.  The patient denied having side effects to prescribed psychiatric medication.  Gradually, patient started adjusting to milieu. The patient was evaluated each day by a clinical provider to ascertain response to treatment. Improvement was noted by the patient's report of decreasing symptoms, improved sleep and appetite, affect, medication tolerance, behavior, and participation in unit programming.  Patient was asked each day to complete a self inventory noting mood, mental status, pain, new symptoms, anxiety and concerns.    Symptoms were reported as significantly decreased or resolved completely by discharge.   On day of discharge, the patient reports that their mood is stable. The patient denied having suicidal thoughts for more than 48 hours prior to discharge.  Patient denies having homicidal thoughts.  Patient denies having auditory hallucinations.  Patient denies any visual hallucinations or other symptoms of psychosis. The patient was motivated to continue taking medication with a goal of continued improvement in mental health.   The patient reports their target psychiatric symptoms of depression, suicidal thoughts, and command AH, all responded well to the psychiatric medications, and the patient reports overall benefit other psychiatric hospitalization. Supportive psychotherapy was provided to the patient. The patient also participated in regular group therapy while hospitalized. Coping skills, problem solving as well as relaxation therapies were also part of the unit programming.  Labs were reviewed with the patient, and abnormal results were discussed with the patient.  The patient is able to verbalize their individual safety plan to this provider.  # It is recommended to the  patient to continue psychiatric medications as prescribed, after discharge from the hospital.    # It is recommended to the patient to follow up with your outpatient psychiatric provider and PCP.  # It was discussed with the patient, the impact of alcohol, drugs, tobacco have been there overall psychiatric and medical wellbeing, and total abstinence from substance use was recommended the patient.ed.  # Prescriptions provided or sent directly to preferred pharmacy at discharge. Patient agreeable to plan. Given opportunity to ask questions. Appears to feel comfortable with discharge.    # In the event of worsening symptoms, the patient is instructed to call the crisis hotline, 911 and or go to the nearest ED for appropriate evaluation and treatment of symptoms. To follow-up with primary care provider for other medical issues, concerns and or health care needs  # Patient was discharged to care of mother (pt will be taken to [facility], where mother will pick-up), with a plan to follow up as noted below.   Physical Findings: AIMS:  , ,  ,  ,    CIWA:  CIWA-Ar Total: 1 COWS:  Musculoskeletal: Strength & Muscle Tone: within normal limits Gait & Station: normal Patient leans: N/A   Psychiatric Specialty Exam:  Presentation  General Appearance:  Appropriate for Environment; Casual; Fairly Groomed  Eye Contact: Good  Speech: Normal Rate; Clear and Coherent  Speech Volume: Normal  Handedness: Right   Mood and Affect  Mood: Euthymic  Affect: Appropriate; Congruent; Full Range   Thought Process  Thought Processes: Linear  Descriptions of Associations:Intact  Orientation:Full (Time, Place and Person)  Thought Content:Logical  History of Schizophrenia/Schizoaffective disorder:No  Duration of Psychotic Symptoms:Less than six months  Hallucinations:Hallucinations: None  Ideas of Reference:None  Suicidal Thoughts:Suicidal Thoughts: No  Homicidal Thoughts:Homicidal  Thoughts: No   Sensorium  Memory: Immediate Good; Recent Good; Remote Good  Judgment: Fair  Insight: Fair   Community education officer  Concentration: Fair  Attention Span: Fair  Recall: Good  Fund of Knowledge: Good  Language: Good   Psychomotor Activity  Psychomotor Activity: Psychomotor Activity: Normal   Assets  Assets: Desire for Improvement; Communication Skills; Social Support   Sleep  Sleep: Sleep: Fair    Physical Exam: Physical Exam Vitals reviewed.  Constitutional:      General: He is not in acute distress.    Appearance: He is normal weight. He is not toxic-appearing.  Pulmonary:     Effort: Pulmonary effort is normal. No respiratory distress.  Neurological:     Mental Status: He is alert.     Motor: No weakness.     Gait: Gait normal.  Psychiatric:        Mood and Affect: Mood normal.        Behavior: Behavior normal.        Thought Content: Thought content normal.        Judgment: Judgment normal.    Review of Systems  Constitutional:  Negative for chills and fever.  Cardiovascular:  Negative for chest pain and palpitations.  Neurological:  Negative for dizziness, tingling, tremors and headaches.  Psychiatric/Behavioral:  Negative for depression, hallucinations, memory loss, substance abuse and suicidal ideas. The patient is not nervous/anxious and does not have insomnia.    Blood pressure 112/84, pulse 73, temperature (!) 97.5 F (36.4 C), temperature source Oral, resp. rate 20, height '6\' 2"'$  (1.88 m), weight 106 kg, SpO2 98 %. Body mass index is 29.99 kg/m.   Social History   Tobacco Use  Smoking Status Every Day   Packs/day: 1.00   Years: 20.00   Additional pack years: 0.00   Total pack years: 20.00   Types: Cigarettes  Smokeless Tobacco Never   Tobacco Cessation:  A prescription for an FDA-approved tobacco cessation medication provided at discharge   Blood Alcohol level:  Lab Results  Component Value Date   ETH  <10 08/26/2022   ETH <10 [id number]    Metabolic Disorder Labs:  Lab Results  Component Value Date   HGBA1C 5.2 09/02/2022   MPG 103 09/02/2022   No results found for: "PROLACTIN" Lab Results  Component Value Date   CHOL 186 09/02/2022   TRIG 94 09/02/2022   HDL 35 (L) 09/02/2022   CHOLHDL 5.3 09/02/2022   VLDL 19 09/02/2022   LDLCALC 132 (H) 09/02/2022    See Psychiatric Specialty Exam and Suicide Risk Assessment completed by Attending Physician prior to discharge.  Discharge destination:  Other:  mother's care/home  Is patient on multiple antipsychotic therapies at discharge:  No   Has Patient had three or more failed trials of antipsychotic monotherapy by history:  No  Recommended Plan for Multiple Antipsychotic Therapies: NA  Discharge Instructions     Diet - low sodium heart healthy   Complete by: As directed    Increase activity slowly   Complete by: As directed       Allergies as of 09/03/2022   No Known Allergies      Medication List     STOP taking these medications    clonazePAM 1 MG disintegrating tablet Commonly known as: KLONOPIN   divalproex 500 MG DR tablet Commonly known as: DEPAKOTE Replaced by: divalproex 500 MG 24 hr tablet   loperamide 2 MG tablet Commonly known as: Imodium A-D   ondansetron 4 MG tablet Commonly known as: Zofran       TAKE these medications      Indication  ARIPiprazole 10 MG tablet Commonly known as: ABILIFY Take 1 tablet (10 mg total) by mouth daily. What changed: Another medication with the same name was removed. Continue taking this medication, and follow the directions you see here.  Indication: MIXED BIPOLAR AFFECTIVE DISORDER   divalproex 500 MG 24 hr tablet Commonly known as: DEPAKOTE ER Take 2 tablets (1,000 mg total) by mouth at bedtime. Replaces: divalproex 500 MG DR tablet  Indication: MIXED BIPOLAR AFFECTIVE DISORDER   DULoxetine 30 MG capsule Commonly known as: CYMBALTA Take 1 capsule  (30 mg total) by mouth daily. Start taking on: September 04, 2022  Indication: Generalized Anxiety Disorder   nicotine 14 mg/24hr patch Commonly known as: NICODERM CQ - dosed in mg/24 hours Place 1 patch (14 mg total) onto the skin daily.  Indication: Nicotine Addiction   nicotine polacrilex 2 MG gum Commonly known as: NICORETTE Take 1 each (2 mg total) by mouth as needed for smoking cessation.  Indication: Nicotine Addiction   traZODone 150 MG tablet Commonly known as: DESYREL Take 1 tablet (150 mg total) by mouth at bedtime as needed for sleep.  Indication: [place] on 09/04/2022.   Why: You have a hospital follow up appointment on 09/04/22 at 10:00 am. This appointment will be held in person. Following this appointment you will be scheduled for a clinical assessment, to obtain therapy and medication management services. Contact information: [place] [address] [phone]                 Follow-up recommendations:    Activity: as tolerated  Diet: heart healthy  Other: -Follow-up with your outpatient psychiatric provider -instructions on appointment date, time, and address (location) are provided to you in discharge paperwork.  -Rx were printed   -Take your psychiatric medications as prescribed at discharge - instructions are provided to you in the discharge paperwork  -Follow-up with outpatient primary care doctor and other specialists -for management of preventative medicine and chronic medical disease.  -Testing: Follow-up with outpatient provider for lab results:  Valproic acid level 08-30-22: 37   -Recommend abstinence from alcohol, tobacco, and other illicit drug use at discharge.   -If your psychiatric symptoms recur, worsen, or if you have side effects to your psychiatric medications, call your outpatient psychiatric provider, 911, 988 or go to the nearest  emergency department.  -If suicidal thoughts recur, call your outpatient psychiatric provider, 911, 988 or go to the nearest emergency department.   Signed: Christoper Allegra, MD 09/03/2022, 9:07 AM  Total Time Spent in Direct Patient Care:  I personally spent 35  minutes on the unit in direct patient care. The direct patient care time included face-to-face time with the patient, reviewing the patient's chart, communicating with other professionals, and coordinating care. Greater than 50% of this time was spent in counseling or coordinating care with the patient regarding goals of hospitalization, psycho-education, and discharge planning needs.   Janine Limbo, MD Psychiatrist

## 2022-09-03 NOTE — Group Note (Signed)
Date:  09/03/2022 Time:  10:56 PM  Group Topic/Focus:  Wrap-Up Group:   The focus of this group is to help patients review their daily goal of treatment and discuss progress on daily workbooks.  Pt did not attend wrap up group this evening.   Cristi Loron 09/03/2022, 10:56 PM
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
## 2022-09-03 NOTE — Progress Notes (Signed)
   09/03/22 0847  Psych Admission Type (Psych Patients Only)  Admission Status Voluntary  Psychosocial Assessment  Patient Complaints Anxiety;Depression  Eye Contact Fair  Facial Expression Flat  Affect Depressed  Speech Logical/coherent  Interaction Minimal  Motor Activity Other (Comment) (WNL)  Appearance/Hygiene Disheveled  Behavior Characteristics Cooperative  Mood Pleasant  Thought Process  Coherency WDL  Content WDL  Delusions None reported or observed  Perception WDL  Hallucination None reported or observed  Judgment Impaired  Confusion None  Danger to Self  Current suicidal ideation? Denies  Agreement Not to Harm Self Yes  Description of Agreement verbal  Danger to Others  Danger to Others None reported or observed

## 2022-09-03 NOTE — Progress Notes (Signed)
   09/03/22 0640  15 Minute Checks  Location Hallway  Visual Appearance Calm  Behavior Composed  Sleep (Behavioral Health Patients Only)  Calculate sleep? (Click Yes once per 24 hr at 0600 safety check) Yes  Documented sleep last 24 hours 7.5

## 2022-09-03 NOTE — Plan of Care (Signed)
  Problem: Activity: Goal: Interest or engagement in activities will improve Outcome: Progressing   Problem: Coping: Goal: Ability to demonstrate self-control will improve Outcome: Progressing   Problem: Safety: Goal: Periods of time without injury will increase Outcome: Progressing   

## 2022-09-03 NOTE — Group Note (Signed)
Date:  09/03/2022 Time:  9:58 AM  Group Topic/Focus:  Orientation:   The focus of this group is to educate the patient on the purpose and policies of crisis stabilization and provide a format to answer questions about their admission.  The group details unit policies and expectations of patients while admitted.    Participation Level:  Active  Participation Quality:  Appropriate  Affect:  Appropriate  Cognitive:  Appropriate  Insight: Appropriate  Engagement in Group:  Engaged  Modes of Intervention:  Discussion  Additional Comments:     Jerrye Beavers 09/03/2022, 9:58 AM

## 2022-09-03 NOTE — BHH Counselor (Signed)
CSW spoke with the Pt's mother, Adom Cabadas who states that her son cannot be discharged "until Friday at 1:00pm".  She states that her home has not been completed at this time and that "the floors still need to be put in and the toilets, and the beds".  She asks CSW if the Pt is receiving an injection of their medications.  CSW explained that the Pt is still receiving pill forms of their medications and that the doctors feel that he is doing well on those medications.  She states "He has to receive Klonopin and injectable Abilify because he will act like he is fine but he is lying to all of you and will come home and run away or pace for hours".  She states "he has torn up 2 recliners and pee's on himself".  She states "I have a husband that can't leave his bed and my son has to be on Klonopin for I will not be able to function properly". Mrs. Gulino was talking very fast and very loudly on the phone with the CSW.  CSW explained to Mrs. Hoawrd that she would ask the providers to keep hMrs. Hussain states that she will pick her son up at 1:00pm.  CSW also explained that the providers feel that her son is doing well on his medications at this time and will not be changing them.  Mrs. Nadara Mustard asked that a provider call her to discuss the medications.  After ending the phone call CSW informed the provider of this information.  Provider states that he will cancel the Pt's discharge that was scheduled for 09/03/2022 and move it to 09/04/2022.  The Provider also states that he will not be calling the mother and that the Pt's medications will not be changed at this time.

## 2022-09-04 DIAGNOSIS — F314 Bipolar disorder, current episode depressed, severe, without psychotic features: Principal | ICD-10-CM

## 2022-09-04 NOTE — Progress Notes (Signed)
   09/04/22 0000  Psych Admission Type (Psych Patients Only)  Admission Status Voluntary  Psychosocial Assessment  Patient Complaints Depression  Eye Contact Fair  Facial Expression Flat;Animated  Affect Depressed  Speech Logical/coherent  Interaction Minimal  Motor Activity Slow  Appearance/Hygiene In scrubs;Disheveled  Behavior Characteristics Cooperative  Mood Pleasant  Thought Process  Coherency WDL  Content WDL  Delusions None reported or observed  Perception WDL  Hallucination None reported or observed  Judgment Impaired  Confusion None  Danger to Self  Current suicidal ideation? Denies  Self-Injurious Behavior No self-injurious ideation or behavior indicators observed or expressed   Agreement Not to Harm Self No  Description of Agreement verbal  Danger to Others  Danger to Others None reported or observed

## 2022-09-04 NOTE — Group Note (Signed)
Recreation Therapy Group Note   Group Topic:Problem Solving  Group Date: 09/04/2022 Start Time: 0935 End Time: 1005 Facilitators: Kadeem Hyle-McCall, LRT,CTRS Location: 300 Hall Dayroom   Goal Area(s) Addresses:  Patient will verbalize importance of using appropriate problem solving techniques.  Patient will identify positive change associated with effective problem solving skills.   Group Description:  Trivia.  In celebration of [place]. Patrick's Day, LRT engaged with group with St. Patrick's Day trivia.  LRT would read the questions and patients were given 10 seconds to write their responses down.  LRT would then give the group the correct answer.    Affect/Mood: N/A   Participation Level: Did not attend    Clinical Observations/Individualized Feedback:     Plan: Continue to engage patient in RT group sessions 2-3x/week.   Tory Septer-McCall, LRT,CTRS 09/04/2022 1:37 PM

## 2022-09-04 NOTE — Progress Notes (Signed)
Patient discharged from [facility] on 09/04/22 at 1320. Patient denies SI, plan, and intention. Suicide safety plan completed, reviewed with this RN, given to the patient, and a copy in the chart. Patient denies HI/AVH upon discharge.  Patient is alert, oriented, and cooperative. RN provided patient with discharge paperwork and reviewed information with patient. Patient expressed that he understood all of the discharge instructions. Pt was satisfied with belongings returned to him from the locker and at bedside. Discharged patient to [facility] waiting room. Pt's mother awaiting patient in the [facility] waiting room.

## 2022-09-04 NOTE — Progress Notes (Signed)
   09/04/22 0556  15 Minute Checks  Location Bedroom  Visual Appearance Calm  Behavior Sleeping  Sleep (Behavioral Health Patients Only)  Calculate sleep? (Click Yes once per 24 hr at 0600 safety check) Yes  Documented sleep last 24 hours 12.75

## 2022-09-04 NOTE — Progress Notes (Signed)
  [facility] Adult Case Management Discharge Plan :  Will you be returning to the same living situation after discharge:  No. Home with Mother  At discharge, do you have transportation home?: Yes,  Mother  Do you have the ability to pay for your medications: Yes,  Family   Release of information consent forms completed and in the chart;  Patient's signature needed at discharge.  Patient to Follow up at:  Follow-up Information     [facility] on 09/07/2022.   Why: You have a hospital follow up appointment on 09/07/22 at 10:00 am. This appointment will be held in person. Following this appointment you will be scheduled for a clinical assessment, to obtain therapy and medication management services. Contact information: [place] [address] [phone]                 Next level of care provider has access to [place] and Suicide Prevention discussed: Yes,  with patient and mother      Has patient been referred to the Quitline?: Patient refused referral  Patient has been referred for addiction treatment: Pt. refused referral, Patient may inquire about  SA services during intake assessment.   Darleen Crocker, LCSWA 09/04/2022, 9:47 AM

## 2022-09-29 ENCOUNTER — Other Ambulatory Visit: Payer: Self-pay

## 2022-09-29 MED ORDER — DULOXETINE HCL 30 MG PO CPEP
30.0000 mg | ORAL_CAPSULE | Freq: Every day | ORAL | 1 refills | Status: DC
  Filled 2022-09-29: qty 30, 30d supply, fill #0

## 2022-09-29 MED ORDER — DIVALPROEX SODIUM ER 500 MG PO TB24
1000.0000 mg | ORAL_TABLET | Freq: Every day | ORAL | 1 refills | Status: DC
  Filled 2022-09-29: qty 16, 8d supply, fill #0
  Filled 2022-09-29: qty 44, 22d supply, fill #0

## 2022-09-29 MED ORDER — ARIPIPRAZOLE 10 MG PO TABS
10.0000 mg | ORAL_TABLET | Freq: Every day | ORAL | 1 refills | Status: DC
  Filled 2022-09-29: qty 30, 30d supply, fill #0

## 2022-10-12 ENCOUNTER — Other Ambulatory Visit: Payer: Self-pay

## 2022-10-20 ENCOUNTER — Ambulatory Visit: Payer: Self-pay | Admitting: Nurse Practitioner

## 2022-12-19 ENCOUNTER — Other Ambulatory Visit: Payer: Self-pay

## 2022-12-19 ENCOUNTER — Encounter: Payer: Self-pay | Admitting: Emergency Medicine

## 2022-12-19 ENCOUNTER — Emergency Department
Admission: EM | Admit: 2022-12-19 | Discharge: 2022-12-19 | Disposition: A | Discharge status: Other Acute Inpatient Hospital | Payer: No Typology Code available for payment source | Attending: Emergency Medicine | Admitting: Emergency Medicine

## 2022-12-19 DIAGNOSIS — F1020 Alcohol dependence, uncomplicated: Secondary | ICD-10-CM | POA: Diagnosis not present

## 2022-12-19 DIAGNOSIS — F1220 Cannabis dependence, uncomplicated: Secondary | ICD-10-CM | POA: Diagnosis not present

## 2022-12-19 DIAGNOSIS — F17200 Nicotine dependence, unspecified, uncomplicated: Secondary | ICD-10-CM | POA: Diagnosis not present

## 2022-12-19 DIAGNOSIS — F32A Depression, unspecified: Secondary | ICD-10-CM

## 2022-12-19 DIAGNOSIS — Z20822 Contact with and (suspected) exposure to covid-19: Secondary | ICD-10-CM | POA: Insufficient documentation

## 2022-12-19 DIAGNOSIS — Y900 Blood alcohol level of less than 20 mg/100 ml: Secondary | ICD-10-CM | POA: Insufficient documentation

## 2022-12-19 DIAGNOSIS — F314 Bipolar disorder, current episode depressed, severe, without psychotic features: Secondary | ICD-10-CM | POA: Diagnosis present

## 2022-12-19 LAB — COMPREHENSIVE METABOLIC PANEL
ALT: 30 U/L (ref 0–44)
AST: 24 U/L (ref 15–41)
Albumin: 4.8 g/dL (ref 3.5–5.0)
Alkaline Phosphatase: 105 U/L (ref 38–126)
Anion gap: 13 (ref 5–15)
BUN: 21 mg/dL — ABNORMAL HIGH (ref 6–20)
CO2: 21 mmol/L — ABNORMAL LOW (ref 22–32)
Calcium: 9.2 mg/dL (ref 8.9–10.3)
Chloride: 106 mmol/L (ref 98–111)
Creatinine, Ser: 0.83 mg/dL (ref 0.61–1.24)
GFR, Estimated: >60 mL/min (ref >60)
Glucose, Bld: 97 mg/dL (ref 70–99)
Potassium: 3.7 mmol/L (ref 3.5–5.1)
Sodium: 140 mmol/L (ref 135–145)
Total Bilirubin: 2.1 mg/dL — ABNORMAL HIGH (ref 0.3–1.2)
Total Protein: 7.4 g/dL (ref 6.5–8.1)

## 2022-12-19 LAB — CBC
HCT: 46.8 % (ref 39.0–52.0)
Hemoglobin: 15.4 g/dL (ref 13.0–17.0)
MCH: 30.7 pg (ref 26.0–34.0)
MCHC: 32.9 g/dL (ref 30.0–36.0)
MCV: 93.4 fL (ref 80.0–100.0)
Platelets: 241 10*3/uL (ref 150–400)
RBC: 5.01 MIL/uL (ref 4.22–5.81)
RDW: 13.1 % (ref 11.5–15.5)
WBC: 10.9 10*3/uL — ABNORMAL HIGH (ref 4.0–10.5)
nRBC: 0 % (ref 0.0–0.2)

## 2022-12-19 LAB — URINE DRUG SCREEN, QUALITATIVE (ARMC ONLY)
Amphetamines, Ur Screen: NOT DETECTED
Barbiturates, Ur Screen: NOT DETECTED
Benzodiazepine, Ur Scrn: NOT DETECTED
Cannabinoid 50 Ng, Ur ~~LOC~~: POSITIVE — AB
Cocaine Metabolite,Ur ~~LOC~~: POSITIVE — AB
MDMA (Ecstasy)Ur Screen: NOT DETECTED
Methadone Scn, Ur: NOT DETECTED
Opiate, Ur Screen: NOT DETECTED
Phencyclidine (PCP) Ur S: NOT DETECTED
Tricyclic, Ur Screen: NOT DETECTED

## 2022-12-19 LAB — SARS CORONAVIRUS 2 BY RT PCR: SARS Coronavirus 2 by RT PCR: NEGATIVE

## 2022-12-19 LAB — SALICYLATE LEVEL: Salicylate Lvl: <7 mg/dL — ABNORMAL LOW (ref 7.0–30.0)

## 2022-12-19 LAB — ACETAMINOPHEN LEVEL: Acetaminophen (Tylenol), Serum: <10 ug/mL — ABNORMAL LOW (ref 10–30)

## 2022-12-19 LAB — ETHANOL: Alcohol, Ethyl (B): <10 mg/dL (ref <10)

## 2022-12-19 MED ORDER — DIVALPROEX SODIUM 500 MG PO DR TAB
500.0000 mg | DELAYED_RELEASE_TABLET | Freq: Two times a day (BID) | ORAL | Status: DC
  Administered 2022-12-19: 500 mg via ORAL
  Filled 2022-12-19: qty 1

## 2022-12-19 MED ORDER — DULOXETINE HCL 30 MG PO CPEP: 30.0000 mg | ORAL_CAPSULE | Freq: Every day | ORAL | Status: DC

## 2022-12-19 MED ORDER — DIVALPROEX SODIUM ER 250 MG PO TB24: 1000.0000 mg | ORAL_TABLET | Freq: Every day | ORAL | Status: DC

## 2022-12-19 MED ORDER — TRAZODONE HCL 50 MG PO TABS: 150.0000 mg | ORAL_TABLET | Freq: Every evening | ORAL | Status: DC | PRN

## 2022-12-19 MED ORDER — ARIPIPRAZOLE 10 MG PO TABS: 10.0000 mg | ORAL_TABLET | Freq: Every day | ORAL | Status: DC
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
## 2022-12-19 NOTE — ED Notes (Signed)
Patient belongings;  Black shorts White and Engineer, civil (consulting)

## 2022-12-19 NOTE — BH Assessment (Signed)
Referral information for Psychiatric Hospitalization faxed to:  Adult [facility]   -[facility] [phone] or [phone]).  [facility] [phone])  -[place] [phone] or [phone])  -[address] [phone])  -Yvetta Coder [phone] or [phone])  -[facility]ab[facility] [phone])  [facility] Kitty [phone] or [phone])

## 2022-12-19 NOTE — ED Notes (Addendum)
Calling report to [facility] at this time to Ivan Croft, RN.

## 2022-12-19 NOTE — ED Notes (Signed)
Called and informed facility COVID on patient was negative and secretary is currently faxing COVID results to facility at this time.

## 2022-12-19 NOTE — ED Notes (Addendum)
Per TTS patient meets inpatient criteria and awaiting placement at this time.

## 2022-12-19 NOTE — ED Provider Notes (Signed)
   [facility] Provider Note    Event Date/Time   First MD Initiated Contact with Patient 12/19/22 1043     (approximate)  History   Chief Complaint: Depression  HPI  Cody Young. is a 32 y.o. male with a past medical history of bipolar who presents to the emergency department for worsening depression.  According to the patient he had an argument with his mother which led to worsening depression does state vague thoughts of hurting himself although no active plan to do so.  States he has been using alcohol but denies any drug use.  States after the argument with his mother he left the house and is now homeless.  Patient has no medical plaints.  Physical Exam   Triage Vital Signs: ED Triage Vitals  Enc Vitals Group     BP 12/19/22 1022 (!) 152/102     Pulse Rate 12/19/22 1022 (!) 115     Resp 12/19/22 1022 16     Temp 12/19/22 1022 98 F (36.7 C)     Temp Source 12/19/22 1022 Oral     SpO2 12/19/22 1022 98 %     Weight 12/19/22 1017 233 lb 11 oz (106 kg)     Height 12/19/22 1017 6\' 2"  (1.88 m)     Head Circumference --      Peak Flow --      Pain Score 12/19/22 1017 0     Pain Loc --      Pain Edu? --      Excl. in GC? --     Most recent vital signs: Vitals:   12/19/22 1022  BP: (!) 152/102  Pulse: (!) 115  Resp: 16  Temp: 98 F (36.7 C)  SpO2: 98%    General: Awake, no distress.  CV:  Good peripheral perfusion.  Regular rate and rhythm  Resp:  Normal effort.  Equal breath sounds bilaterally.  Abd:  No distention.  Soft, nontender.  No rebound or guarding.  ED Results / Procedures / Treatments   MEDICATIONS ORDERED IN ED: Medications - No data to display   IMPRESSION / MDM / ASSESSMENT AND PLAN / ED COURSE  I reviewed the triage vital signs and the nursing notes.  Patient's presentation is most consistent with acute presentation with potential threat to life or bodily function.  Patient presents emergency department for  worsening depression and homelessness.  Overall the patient appears well, no distress.  Denies any active plan to kill himself although states he has vague thoughts of doing so at times.  States he has been using alcohol last drink was yesterday.  Denies any withdrawal symptoms.  No drug use.  We will check labs we will have psychiatry TTS evaluate continue to closely monitor.  Patient agreeable to plan of care and remains here voluntarily at this time.  Lab work reassuring including normal chemistry, negative alcohol salicylate acetaminophen, reassuring CBC positive for cocaine and cannabinoids on his drug screen.  Psychiatry has seen and evaluated and believe the patient would benefit from inpatient treatment.  They are currently looking for inpatient placement.  FINAL CLINICAL IMPRESSION(S) / ED DIAGNOSES   Depression    Note:  This document was prepared using Dragon voice recognition software and may include unintentional dictation errors.   Minna Antis, MD 12/19/22 1324

## 2022-12-19 NOTE — ED Notes (Signed)
Obtaining ride waiver and release of liability and consent for transfer from patient at this time.

## 2022-12-19 NOTE — ED Notes (Signed)
EMTALA reviewed and all required documentation is up to date. Pt is ready for transport.  

## 2022-12-19 NOTE — ED Notes (Addendum)
Patient provided lunch tray at this time.

## 2022-12-19 NOTE — ED Notes (Signed)
Patient belongings:  Insurance claims handler card [employer] Sunglasses Black t-shirt Black baseball cap

## 2022-12-19 NOTE — ED Notes (Signed)
Patient tolerated taking 1300 meds at this time with no difficulty. Trash in room disposed of at this time.

## 2022-12-19 NOTE — ED Notes (Signed)
All patient belongings bag 2/2 given to safe transport to take with patient to [facility].

## 2022-12-19 NOTE — Consult Note (Signed)
[facility] Face-to-Face Psychiatry Consult   Reason for Consult:  suicidal ideations, substance abuse Referring Physician:  EDP Patient Identification: Cody Young. MRN:  [id number] Principal Diagnosis: Bipolar affective disorder, depressed, severe (HCC) Diagnosis:  Principal Problem:   Bipolar affective disorder, depressed, severe (HCC)   Total Time spent with patient: 45 minutes  Subjective:   Cody Young. is a 32 y.o. male patient admitted with suicidal ideations.  HPI:  32 yo male with a history of bipolar disorder and alcohol/cocaine use disorders, presented to the ED with suicidal ideations.  On assessment, he is calm and cooperative.  He reported his mother kicked him out of her house when he missed his Abilify maintenna shot a couple of days ago.  He left and started drinking alcohol (3-40 oz beers yesterday, and a few today--negative BAL), no withdrawal symptoms or history of complications.  Cocaine/crack use the past two days.  His depression is high with plans to overdose.  Moderate anxiety.  His sleep has been poor the past two nights while staying at the shelter.  Appetite is slightly decreased, no weight changes.  He reported hallucinations "in my sleep", not responding to internal stimuli on assessment.  Psych admission needed for stabilization.  Past Psychiatric History: bipolar d/o, cocaine/alcohol use d/o  Risk to Self:  yes Risk to Others:  none Prior Inpatient Therapy:  many Prior Outpatient Therapy:  yes  Past Medical History:  Past Medical History:  Diagnosis Date   Bipolar 1 disorder (HCC)     Past Surgical History:  Procedure Laterality Date   BRAIN SURGERY     Family History: No family history on file. Family Psychiatric  History: none Social History:  Social History   Substance and Sexual Activity  Alcohol Use Yes   Alcohol/week: 12.0 standard drinks of alcohol   Types: 12 Cans of beer per week   Comment: Currently drinks mad dogs      Social History   Substance and Sexual Activity  Drug Use Yes   Types: Marijuana, Cocaine, "Crack" cocaine   Comment: deneis current cocaine use    Social History   Socioeconomic History   Marital status: Single    Spouse name: Not on file   Number of children: Not on file   Years of education: Not on file   Highest education level: Not on file  Occupational History   Not on file  Tobacco Use   Smoking status: Every Day    Packs/day: 1.00    Years: 20.00    Additional pack years: 0.00    Total pack years: 20.00    Types: Cigarettes   Smokeless tobacco: Never  Substance and Sexual Activity   Alcohol use: Yes    Alcohol/week: 12.0 standard drinks of alcohol    Types: 12 Cans of beer per week    Comment: Currently drinks mad dogs   Drug use: Yes    Types: Marijuana, Cocaine, "Crack" cocaine    Comment: deneis current cocaine use   Sexual activity: Yes    Birth control/protection: Condom  Other Topics Concern   Not on file  Social History Narrative   Not on file   Social Determinants of Health   Financial Resource Strain: Not on file  Food Insecurity: No Food Insecurity (08/27/2022)   Hunger Vital Sign    Worried About Running Out of Food in the Last Year: Never true    Ran Out of Food in the Last Year: Never true  Transportation Needs: No Transportation Needs (08/27/2022)   PRAPARE - Administrator, Civil Service (Medical): No    Lack of Transportation (Non-Medical): No  Physical Activity: Not on file  Stress: Not on file  Social Connections: Not on file   Additional Social History:    Allergies:  No Known Allergies  Labs:  Results for orders placed or performed during the hospital encounter of 12/19/22 (from the past 48 hour(s))  Comprehensive metabolic panel     Status: Abnormal   Collection Time: 12/19/22 10:22 AM  Result Value Ref Range   Sodium 140 135 - 145 mmol/L   Potassium 3.7 3.5 - 5.1 mmol/L   Chloride 106 98 - 111 mmol/L   CO2 21  (L) 22 - 32 mmol/L   Glucose, Bld 97 70 - 99 mg/dL    Comment: Glucose reference range applies only to samples taken after fasting for at least 8 hours.   BUN 21 (H) 6 - 20 mg/dL   Creatinine, Ser 1.61 0.61 - 1.24 mg/dL   Calcium 9.2 8.9 - 09.6 mg/dL   Total Protein 7.4 6.5 - 8.1 g/dL   Albumin 4.8 3.5 - 5.0 g/dL   AST 24 15 - 41 U/L   ALT 30 0 - 44 U/L   Alkaline Phosphatase 105 38 - 126 U/L   Total Bilirubin 2.1 (H) 0.3 - 1.2 mg/dL   GFR, Estimated >04 >54 mL/min    Comment: (NOTE) Calculated using the CKD-EPI Creatinine Equation (2021)    Anion gap 13 5 - 15    Comment: Performed at [facility], [address] [place], [place] [address]  Ethanol     Status: None   Collection Time: 12/19/22 10:22 AM  Result Value Ref Range   Alcohol, Ethyl (B) <10 <10 mg/dL    Comment: (NOTE) Lowest detectable limit for serum alcohol is 10 mg/dL.  For medical purposes only. Performed at [facility], [address] [place], [place] [address]   Salicylate level     Status: Abnormal   Collection Time: 12/19/22 10:22 AM  Result Value Ref Range   Salicylate Lvl <7.0 (L) 7.0 - 30.0 mg/dL    Comment: Performed at [facility], [address] [place], [place] [address]  Acetaminophen level     Status: Abnormal   Collection Time: 12/19/22 10:22 AM  Result Value Ref Range   Acetaminophen (Tylenol), Serum <10 (L) 10 - 30 ug/mL    Comment: (NOTE) Therapeutic concentrations vary significantly. A range of 10-30 ug/mL  may be an effective concentration for many patients. However, some  are best treated at concentrations outside of this range. Acetaminophen concentrations >150 ug/mL at 4 hours after ingestion  and >50 ug/mL at 12 hours after ingestion are often associated with  toxic reactions.  Performed at [facility], [address] [place], [place] [address]   cbc     Status: Abnormal   Collection Time: 12/19/22 10:22 AM  Result Value Ref Range    WBC 10.9 (H) 4.0 - 10.5 K/uL   RBC 5.01 4.22 - 5.81 MIL/uL   Hemoglobin 15.4 13.0 - 17.0 g/dL   HCT 57.8 46.9 - 62.9 %   MCV 93.4 80.0 - 100.0 fL   MCH 30.7 26.0 - 34.0 pg   MCHC 32.9 30.0 - 36.0 g/dL   RDW 52.8 41.3 - 24.4 %   Platelets 241 150 - 400 K/uL   nRBC 0.0 0.0 - 0.2 %  Comment: Performed at [facility], [address] [place], [place] [address]  Urine Drug Screen, Qualitative     Status: Abnormal   Collection Time: 12/19/22 10:22 AM  Result Value Ref Range   Tricyclic, Ur Screen NONE DETECTED NONE DETECTED   Amphetamines, Ur Screen NONE DETECTED NONE DETECTED   MDMA (Ecstasy)Ur Screen NONE DETECTED NONE DETECTED   Cocaine Metabolite,Ur [place] POSITIVE (A) NONE DETECTED   Opiate, Ur Screen NONE DETECTED NONE DETECTED   Phencyclidine (PCP) Ur S NONE DETECTED NONE DETECTED   Cannabinoid 50 Ng, Ur [place] POSITIVE (A) NONE DETECTED   Barbiturates, Ur Screen NONE DETECTED NONE DETECTED   Benzodiazepine, Ur Scrn NONE DETECTED NONE DETECTED   Methadone Scn, Ur NONE DETECTED NONE DETECTED    Comment: (NOTE) Tricyclics + metabolites, urine    Cutoff 1000 ng/mL Amphetamines + metabolites, urine  Cutoff 1000 ng/mL MDMA (Ecstasy), urine              Cutoff 500 ng/mL Cocaine Metabolite, urine          Cutoff 300 ng/mL Opiate + metabolites, urine        Cutoff 300 ng/mL Phencyclidine (PCP), urine         Cutoff 25 ng/mL Cannabinoid, urine                 Cutoff 50 ng/mL Barbiturates + metabolites, urine  Cutoff 200 ng/mL Benzodiazepine, urine              Cutoff 200 ng/mL Methadone, urine                   Cutoff 300 ng/mL  The urine drug screen provides only a preliminary, unconfirmed analytical test result and should not be used for non-medical purposes. Clinical consideration and professional judgment should be applied to any positive drug screen result due to possible interfering substances. A more specific alternate chemical method must be used in order to obtain a  confirmed analytical result. Gas chromatography / mass spectrometry (GC/MS) is the preferred confirm atory method. Performed at [facility], [address] [place], [place] [address]     No current facility-administered medications for this encounter.   Current Outpatient Medications  Medication Sig Dispense Refill   ARIPiprazole (ABILIFY) 10 MG tablet Take 1 tablet (10 mg total) by mouth daily. 30 tablet 0   ARIPiprazole (ABILIFY) 10 MG tablet Take 1 tablet (10 mg total) by mouth at bedtime. 30 tablet 1   divalproex (DEPAKOTE ER) 500 MG 24 hr tablet Take 2 tablets (1,000 mg total) by mouth at bedtime. 60 tablet 0   divalproex (DEPAKOTE ER) 500 MG 24 hr tablet Take 2 tablets (1,000 mg total) by mouth at bedtime. 60 tablet 1   DULoxetine (CYMBALTA) 30 MG capsule Take 1 capsule (30 mg total) by mouth daily. 30 capsule 0   DULoxetine (CYMBALTA) 30 MG capsule Take 1 capsule (30 mg total) by mouth at bedtime. 30 capsule 1   nicotine (NICODERM CQ - DOSED IN MG/24 HOURS) 14 mg/24hr patch Place 1 patch (14 mg total) onto the skin daily. 14 patch 0   nicotine polacrilex (NICORETTE) 2 MG gum Take 1 each (2 mg total) by mouth as needed for smoking cessation. 100 tablet 0   traZODone (DESYREL) 150 MG tablet Take 1 tablet (150 mg total) by mouth at bedtime as needed for sleep. 30 tablet 0    Musculoskeletal: Strength & Muscle Tone: within normal limits Gait & Station: normal  Patient leans: N/A  Psychiatric Specialty Exam: Physical Exam Vitals and nursing note reviewed.  Constitutional:      Appearance: Normal appearance.  HENT:     Head: Normocephalic.     Nose: Nose normal.  Pulmonary:     Effort: Pulmonary effort is normal.  Musculoskeletal:        General: Normal range of motion.     Cervical back: Normal range of motion.  Neurological:     General: No focal deficit present.     Mental Status: He is alert and oriented to person, place, and time.  Psychiatric:         Attention and Perception: Attention and perception normal.        Mood and Affect: Mood is anxious and depressed.        Speech: Speech normal.        Behavior: Behavior normal. Behavior is cooperative.        Thought Content: Thought content includes suicidal ideation. Thought content includes suicidal plan.        Cognition and Memory: Cognition and memory normal.        Judgment: Judgment normal.     Review of Systems  Psychiatric/Behavioral:  Positive for depression, substance abuse and suicidal ideas. The patient is nervous/anxious.   All other systems reviewed and are negative.   Blood pressure (!) 152/102, pulse (!) 115, temperature 98 F (36.7 C), temperature source Oral, resp. rate 16, height 6\' 2"  (1.88 m), weight 106 kg, SpO2 98 %.Body mass index is 30 kg/m.  General Appearance: Disheveled  Eye Contact:  Fair  Speech:  Normal Rate  Volume:  Normal  Mood:  Anxious and Depressed  Affect:  Congruent  Thought Process:  Coherent  Orientation:  Full (Time, Place, and Person)  Thought Content:  Rumination  Suicidal Thoughts:  Yes.  with intent/plan  Homicidal Thoughts:  No  Memory:  Immediate;   Fair Recent;   Fair Remote;   Fair  Judgement:  Poor  Insight:  Fair  Psychomotor Activity:  Decreased  Concentration:  Concentration: Fair and Attention Span: Fair  Recall:  [employer] of Knowledge:  Fair  Language:  Good  Akathisia:  No  Handed:  Right  AIMS (if indicated):     Assets:  Leisure Time Physical Health Resilience  ADL's:  Intact  Cognition:  WNL  Sleep:        Physical Exam: Physical Exam Vitals and nursing note reviewed.  Constitutional:      Appearance: Normal appearance.  HENT:     Head: Normocephalic.     Nose: Nose normal.  Pulmonary:     Effort: Pulmonary effort is normal.  Musculoskeletal:        General: Normal range of motion.     Cervical back: Normal range of motion.  Neurological:     General: No focal deficit present.     Mental  Status: He is alert and oriented to person, place, and time.  Psychiatric:        Attention and Perception: Attention and perception normal.        Mood and Affect: Mood is anxious and depressed.        Speech: Speech normal.        Behavior: Behavior normal. Behavior is cooperative.        Thought Content: Thought content includes suicidal ideation. Thought content includes suicidal plan.        Cognition and Memory: Cognition and memory normal.  Judgment: Judgment normal.    Review of Systems  Psychiatric/Behavioral:  Positive for depression, substance abuse and suicidal ideas. The patient is nervous/anxious.   All other systems reviewed and are negative.  Blood pressure (!) 152/102, pulse (!) 115, temperature 98 F (36.7 C), temperature source Oral, resp. rate 16, height 6\' 2"  (1.88 m), weight 106 kg, SpO2 98 %. Body mass index is 30 kg/m.  Treatment Plan Summary: Daily contact with patient to assess and evaluate symptoms and progress in treatment, Medication management, and Plan : Bipolar affective disorder, depressed, severe without psychosis: -Depakote DR 500 mg BID -Cymbalta 30 mg daily -Abilify 10 mg daily at bedtime  Insomnia: Trazodone 150 mg daily PRN sleep   Disposition: Recommend psychiatric Inpatient admission when medically cleared.  Nanine Means, NP 12/19/2022 12:49 PM

## 2022-12-19 NOTE — ED Notes (Signed)
Attempted to call patients mother per patients request to inform her he was being transferred to [facility]. Mother did not answer phone call and inbox is full so unable to leave a message for the mother at this time.

## 2022-12-19 NOTE — BH Assessment (Signed)
Patient has been accepted to [facility] Patient assigned to Adult Unit. Accepting physician is Dr. Shanon Payor Call report to [phone]  ER Staff is aware of it: Christen Bame, ER Secretary Dr. Lenard Lance ER MD [name], Patient's Nurse

## 2022-12-19 NOTE — ED Triage Notes (Signed)
States had a fight with mother yesterday over drinking alcohol in the home.  States had been living with mother, but now homeless.  States did not get last months abilify because he had not done 1:1 therapy. Arrives today c/o SI  AAOx3.  Calm and cooperative.

## 2023-02-25 ENCOUNTER — Emergency Department
Admission: EM | Admit: 2023-02-25 | Discharge: 2023-02-25 | Disposition: A | Discharge status: Home / Self Care | Payer: MEDICAID | Attending: Emergency Medicine | Admitting: Emergency Medicine

## 2023-02-25 ENCOUNTER — Other Ambulatory Visit: Payer: Self-pay

## 2023-02-25 ENCOUNTER — Encounter: Payer: Self-pay | Admitting: Emergency Medicine

## 2023-02-25 DIAGNOSIS — K0889 Other specified disorders of teeth and supporting structures: Secondary | ICD-10-CM | POA: Diagnosis present

## 2023-02-25 MED ORDER — KETOROLAC TROMETHAMINE 15 MG/ML IJ SOLN
15.0000 mg | Freq: Once | INTRAMUSCULAR | Status: AC
  Administered 2023-02-25: 15 mg via INTRAMUSCULAR
  Filled 2023-02-25: qty 1

## 2023-02-25 MED ORDER — AMOXICILLIN-POT CLAVULANATE 875-125 MG PO TABS
1.0000 | ORAL_TABLET | Freq: Once | ORAL | Status: AC
  Administered 2023-02-25: 1 via ORAL
  Filled 2023-02-25: qty 1

## 2023-02-25 MED ORDER — AMOXICILLIN-POT CLAVULANATE 875-125 MG PO TABS
1.0000 | ORAL_TABLET | Freq: Two times a day (BID) | ORAL | 0 refills | Status: AC
Start: 2023-02-25 — End: 2023-03-08
  Filled 2023-02-25: qty 20, 10d supply, fill #0

## 2023-02-25 NOTE — ED Triage Notes (Signed)
Pt endorses left lower dental pain for weeks. Also endorses headache due to pain.

## 2023-02-25 NOTE — ED Provider Notes (Signed)
[facility] Provider Note  Patient Contact: 4:53 PM (approximate)   History   Dental Pain   HPI  Cody Young. is a 32 y.o. male presents to the emergency department with pain surrounding inferior 17.  Patient is able to speak in complete sentences and maintain his own secretions.  He is looking for referral to a local dentist.  No fever or chills.      Physical Exam   Triage Vital Signs: ED Triage Vitals [02/25/23 1530]  Encounter Vitals Group     BP (!) 158/115     Systolic BP Percentile      Diastolic BP Percentile      Pulse Rate (!) 108     Resp 18     Temp 97.7 F (36.5 C)     Temp Source Oral     SpO2 96 %     Weight 233 lb 11 oz (106 kg)     Height 6\' 2"  (1.88 m)     Head Circumference      Peak Flow      Pain Score 9     Pain Loc      Pain Education      Exclude from Growth Chart     Most recent vital signs: Vitals:   02/25/23 1530  BP: (!) 158/115  Pulse: (!) 108  Resp: 18  Temp: 97.7 F (36.5 C)  SpO2: 96%     General: Alert and in no acute distress. Eyes:  PERRL. EOMI. Head: No acute traumatic findings ENT:      Nose: No congestion/rhinnorhea.      Mouth/Throat: Mucous membranes are moist.  Patient has pain around inferior 17. Neck: No stridor. No cervical spine tenderness to palpation. Cardiovascular:  Good peripheral perfusion Respiratory: Normal respiratory effort without tachypnea or retractions. Lungs CTAB. Good air entry to the bases with no decreased or absent breath sounds. Gastrointestinal: Bowel sounds 4 quadrants. Soft and nontender to palpation. No guarding or rigidity. No palpable masses. No distention. No CVA tenderness. Musculoskeletal: Full range of motion to all extremities.  Neurologic:  No gross focal neurologic deficits are appreciated.  Skin:   No rash noted    ED Results / Procedures / Treatments   Labs (all labs ordered are listed, but only abnormal results are displayed) Labs  Reviewed - No data to display     PROCEDURES:  Critical Care performed: No  Procedures   MEDICATIONS ORDERED IN ED: Medications  amoxicillin-clavulanate (AUGMENTIN) 875-125 MG per tablet 1 tablet (has no administration in time range)  ketorolac (TORADOL) 15 MG/ML injection 15 mg (has no administration in time range)     IMPRESSION / MDM / ASSESSMENT AND PLAN / ED COURSE  I reviewed the triage vital signs and the nursing notes.                              Assessment and plan Dental pain 32 year old male presents to the emergency department with dental pain.  He localizes pain to inferior 17.  No significant swelling of the left lower jaw.  No visualized periapical abscess.  Patient was started on his first dose of Augmentin and given an injection of Toradol.  He was started on Augmentin to be taken twice daily for the next 10 days.  Recommended alternating Tylenol and ibuprofen at home.  All patient questions were answered.     FINAL CLINICAL  IMPRESSION(S) / ED DIAGNOSES   Final diagnoses:  Pain, dental     Rx / DC Orders   ED Discharge Orders          Ordered    amoxicillin-clavulanate (AUGMENTIN) 875-125 MG tablet  2 times daily        02/25/23 1652             Note:  This document was prepared using Dragon voice recognition software and may include unintentional dictation errors.   Pia M[place], PA-C 02/25/23 1655    Minna Antis, MD 02/25/23 1925

## 2023-02-25 NOTE — Discharge Instructions (Signed)
OPTIONS FOR DENTAL FOLLOW UP CARE ° °[place] Department of Health and Human Services - Local Safety Net Dental Clinics °http://www.ncdhhs.gov/dph/oralhealth/services/safetynetclinics.htm °  °[facility] (336-562-3123) ° °[facility] [place] (919-933-9087) ° °[facility] ([phone] ext [phone]) ° °[facility] ([phone]) ° °[facility] ([phone]) °This clinic caters to the indigent population and is on a lottery system. °Location: °[facility], [address], [place] °Clinic Hours: °Wednesdays from 6pm - 9pm, patients seen by a lottery system. °For dates, call or go to [email or web address]/[facility]ac/patients/[email or web address] °Services: °Cleanings, fillings and simple extractions. °Payment Options: °DENTAL WORK IS FREE OF CHARGE. Bring proof of income or support. °Best way to get seen: °Arrive at 5:15 pm - this is a lottery, NOT first come/first serve, so arriving earlier will not increase your chances of being seen. °  °  °[facility] °[phone] °Select option 1 for emergencies °  °Location: °[facility], [address], [place] °Clinic Hours: °No walk-ins accepted - call the day before to schedule an appointment. °Check in times are 9:30 am and 1:30 pm. °Services: °Simple extractions, temporary fillings, pulpectomy/pulp debridement, uncomplicated abscess drainage. °Payment Options: °PAYMENT IS DUE AT THE TIME OF SERVICE.  Fee is usually $100-200, additional surgical procedures (e.g. abscess drainage) may be extra. °Cash, checks, Visa/MasterCard accepted.  Can file Medicaid if patient is covered for dental - patient should call case worker to check. °No discount for [facility] patients. °Best way to get seen: °MUST call the day before and get onto the schedule. Can usually be seen the next 1-2 days. No walk-ins accepted. °  °  °[facility] °[phone] °   °Location: °[facility], [address], [place] °Clinic Hours: °M, W, Th, F 8am or 1:30pm, Tues 9a or 1:30 - first come/first served. °Services: °Simple extractions, temporary fillings, uncomplicated abscess drainage.  You do not need to be an [place] resident. °Payment Options: °PAYMENT IS DUE AT THE TIME OF SERVICE. °Dental insurance, otherwise sliding scale - bring proof of income or support. °Depending on income and treatment needed, cost is usually $50-200. °Best way to get seen: °Arrive early as it is first come/first served. °  °  °[facility] °[phone] °  °Location: °[address] °Clinic Hours: °Mon-Thu 8a-5p °Services: °Most basic dental services including extractions and fillings. °Payment Options: °PAYMENT IS DUE AT THE TIME OF SERVICE. °Sliding scale, up to 50% off - bring proof if income or support. °Medicaid with dental option accepted. °Best way to get seen: °Call to schedule an appointment, can usually be seen within 2 weeks OR they will try to see walk-ins - show up at 8a or 2p (you may have to wait). °  °  °[facility] °[phone] °[place] RESIDENTS ONLY °  °Location: °[facility], [address], [place], [place] [address] °Clinic Hours: By appointment only. °Monday - Thursday 8am-5pm, Friday 8am-12pm °Services: Cleanings, fillings, extractions. °Payment Options: °PAYMENT IS DUE AT THE TIME OF SERVICE. °Cash, Visa or MasterCard. Sliding scale - $30 minimum per service. °Best way to get seen: °Come in to office, complete packet and make an appointment - need proof of income °or support monies for each household member and proof of [place] residence. °Usually takes about a month to get in. °  °  °[facility] °[phone] °  °Location: °[address]   [place] °Clinic Hours: Walk-in Urgent Care Dental Services are offered Monday-Friday  mornings only. °The numbers of emergencies accepted daily is limited to the number of °providers available. °Maximum 15 - Mondays, Wednesdays & Thursdays °Maximum 10 - Tuesdays & Fridays °Services: °You do not need to be a [place] resident to be seen for a dental emergency. °Emergencies are defined as pain, swelling, abnormal bleeding, or dental trauma. Walkins will receive x-rays if needed. °NOTE: Dental cleaning is not an emergency. °Payment Options: °PAYMENT IS DUE AT THE TIME OF SERVICE. °Minimum co-pay is $40.00 for uninsured patients. °Minimum co-pay is $3.00 for Medicaid with dental coverage. °Dental Insurance is accepted and must be presented at time of visit. °Medicare does not cover dental. °Forms of payment: Cash, credit card, checks. °Best way to get seen: °If not previously registered with the clinic, walk-in dental registration begins at 7:15 am and is on a first come/first serve basis. °If previously registered with the clinic, call to make an appointment. °  °  °[facility] °919-776-4359 °[place] RESIDENTS ONLY °  °Location: °[address], [place], [place] °Clinic Hours: °Mon-Thu 10a-2p °Services: Extractions only! °Payment Options: °FREE (donations accepted) - bring proof of income or support °Best way to get seen: °Call and schedule an appointment OR come at 8am on the 1st Monday of every month (except for holidays) when it is first come/first served. °  °  °[facility] °[phone] °  °Location: °[address], [place] °Clinic Hours: °Friday mornings °Services, Payment Options, Best way to get seen: °Call for info °

## 2023-02-25 NOTE — ED Notes (Signed)
Pt asked this RN to call his insurance to get him a ride home. [phone] called and a ride was set up for him to go home around 8pm. Pt made aware of when ride would possibly come and they would call him on his cell phone directly.

## 2023-02-26 ENCOUNTER — Other Ambulatory Visit: Payer: Self-pay

## 2023-03-01 NOTE — Group Note (Deleted)

## 2023-04-04 ENCOUNTER — Emergency Department
Admission: EM | Admit: 2023-04-04 | Discharge: 2023-04-05 | Disposition: A | Discharge status: Home / Self Care | Payer: MEDICAID | Attending: Emergency Medicine | Admitting: Emergency Medicine

## 2023-04-04 ENCOUNTER — Other Ambulatory Visit: Payer: Self-pay

## 2023-04-04 DIAGNOSIS — R45851 Suicidal ideations: Secondary | ICD-10-CM | POA: Diagnosis not present

## 2023-04-04 DIAGNOSIS — F17210 Nicotine dependence, cigarettes, uncomplicated: Secondary | ICD-10-CM | POA: Insufficient documentation

## 2023-04-04 DIAGNOSIS — F1410 Cocaine abuse, uncomplicated: Secondary | ICD-10-CM | POA: Diagnosis not present

## 2023-04-04 DIAGNOSIS — F1590 Other stimulant use, unspecified, uncomplicated: Secondary | ICD-10-CM | POA: Diagnosis present

## 2023-04-04 DIAGNOSIS — F319 Bipolar disorder, unspecified: Secondary | ICD-10-CM

## 2023-04-04 DIAGNOSIS — F1910 Other psychoactive substance abuse, uncomplicated: Secondary | ICD-10-CM | POA: Diagnosis not present

## 2023-04-04 DIAGNOSIS — F1010 Alcohol abuse, uncomplicated: Secondary | ICD-10-CM | POA: Diagnosis not present

## 2023-04-04 DIAGNOSIS — R4689 Other symptoms and signs involving appearance and behavior: Secondary | ICD-10-CM | POA: Diagnosis present

## 2023-04-04 DIAGNOSIS — E860 Dehydration: Secondary | ICD-10-CM | POA: Diagnosis not present

## 2023-04-04 LAB — COMPREHENSIVE METABOLIC PANEL
ALT: 99 U/L — ABNORMAL HIGH (ref 0–44)
AST: 50 U/L — ABNORMAL HIGH (ref 15–41)
Albumin: 4.7 g/dL (ref 3.5–5.0)
Alkaline Phosphatase: 53 U/L (ref 38–126)
Anion gap: 14 (ref 5–15)
BUN: 17 mg/dL (ref 6–20)
CO2: 21 mmol/L — ABNORMAL LOW (ref 22–32)
Calcium: 9.3 mg/dL (ref 8.9–10.3)
Chloride: 103 mmol/L (ref 98–111)
Creatinine, Ser: 0.84 mg/dL (ref 0.61–1.24)
GFR, Estimated: >60 mL/min (ref >60)
Glucose, Bld: 99 mg/dL (ref 70–99)
Potassium: 3.6 mmol/L (ref 3.5–5.1)
Sodium: 138 mmol/L (ref 135–145)
Total Bilirubin: 1.6 mg/dL — ABNORMAL HIGH (ref 0.3–1.2)
Total Protein: 8 g/dL (ref 6.5–8.1)

## 2023-04-04 LAB — CBC
HCT: 49 % (ref 39.0–52.0)
Hemoglobin: 16.4 g/dL (ref 13.0–17.0)
MCH: 30.3 pg (ref 26.0–34.0)
MCHC: 33.5 g/dL (ref 30.0–36.0)
MCV: 90.4 fL (ref 80.0–100.0)
Platelets: 278 10*3/uL (ref 150–400)
RBC: 5.42 MIL/uL (ref 4.22–5.81)
RDW: 12.8 % (ref 11.5–15.5)
WBC: 12 10*3/uL — ABNORMAL HIGH (ref 4.0–10.5)
nRBC: 0 % (ref 0.0–0.2)

## 2023-04-04 LAB — LIPASE, BLOOD: Lipase: 22 U/L (ref 11–51)

## 2023-04-04 LAB — ETHANOL: Alcohol, Ethyl (B): <10 mg/dL (ref <10)

## 2023-04-04 MED ORDER — LORAZEPAM 1 MG PO TABS: 1.0000 mg | ORAL_TABLET | ORAL | Status: DC | PRN

## 2023-04-04 MED ORDER — THIAMINE MONONITRATE 100 MG PO TABS: 100.0000 mg | ORAL_TABLET | Freq: Every day | ORAL | Status: DC

## 2023-04-04 MED ORDER — IBUPROFEN 600 MG PO TABS: 600.0000 mg | ORAL_TABLET | Freq: Three times a day (TID) | ORAL | Status: DC | PRN

## 2023-04-04 MED ORDER — ONDANSETRON HCL 4 MG/2ML IJ SOLN
4.0000 mg | Freq: Once | INTRAMUSCULAR | Status: AC
  Administered 2023-04-04: 4 mg via INTRAVENOUS
  Filled 2023-04-04: qty 2

## 2023-04-04 MED ORDER — THIAMINE HCL 100 MG/ML IJ SOLN
100.0000 mg | Freq: Every day | INTRAMUSCULAR | Status: DC
  Filled 2023-04-04: qty 1

## 2023-04-04 MED ORDER — THIAMINE HCL 100 MG/ML IJ SOLN: 100.0000 mg | Freq: Every day | INTRAMUSCULAR | Status: DC

## 2023-04-04 MED ORDER — ADULT MULTIVITAMIN W/MINERALS CH
1.0000 | ORAL_TABLET | Freq: Every day | ORAL | Status: DC
  Administered 2023-04-05: 1 via ORAL
  Filled 2023-04-04: qty 1

## 2023-04-04 MED ORDER — THIAMINE MONONITRATE 100 MG PO TABS
100.0000 mg | ORAL_TABLET | Freq: Every day | ORAL | Status: DC
  Administered 2023-04-05: 100 mg via ORAL
  Filled 2023-04-04: qty 1

## 2023-04-04 MED ORDER — ONDANSETRON HCL 4 MG PO TABS: 4.0000 mg | ORAL_TABLET | Freq: Three times a day (TID) | ORAL | Status: DC | PRN

## 2023-04-04 MED ORDER — FOLIC ACID 1 MG PO TABS
1.0000 mg | ORAL_TABLET | Freq: Every day | ORAL | Status: DC
  Administered 2023-04-05: 1 mg via ORAL
  Filled 2023-04-04: qty 1

## 2023-04-04 MED ORDER — PANTOPRAZOLE SODIUM 40 MG IV SOLR
40.0000 mg | Freq: Once | INTRAVENOUS | Status: AC
  Administered 2023-04-04: 40 mg via INTRAVENOUS
  Filled 2023-04-04: qty 10

## 2023-04-04 MED ORDER — ALUM & MAG HYDROXIDE-SIMETH 200-200-20 MG/5ML PO SUSP: 30.0000 mL | Freq: Four times a day (QID) | ORAL | Status: DC | PRN

## 2023-04-04 MED ORDER — SODIUM CHLORIDE 0.9 % IV BOLUS
1000.0000 mL | Freq: Once | INTRAVENOUS | Status: AC
  Administered 2023-04-04: 1000 mL via INTRAVENOUS

## 2023-04-04 NOTE — ED Provider Notes (Signed)
[facility] Provider Note    Event Date/Time   First MD Initiated Contact with Patient 04/04/23 [phone]     (approximate)   History   Chief Complaint: Drug Problem   HPI  Cody Young. is a 32 y.o. male with a history of bipolar disorder, cocaine abuse who comes ED reporting being on a bender of crack cocaine and alcohol for the last 4 days.  To me he denies a specific intent to kill himself but does state that he currently plans to use alcohol and crack cocaine is much as possible and is worried that he will go too far and seriously harm himself.  He reports a history of suicide attempts.  No HI, no hallucinations.  Denies chest pain shortness of breath dizziness or palpitations.  Reports that he came to the ED today due to wanting help with his substance abuse, and has no acute pain complaints or other acute symptoms.     Physical Exam   Triage Vital Signs: ED Triage Vitals [04/04/23 1345]  Encounter Vitals Group     BP (!) 151/99     Systolic BP Percentile      Diastolic BP Percentile      Pulse Rate (!) 125     Resp 17     Temp 98.1 F (36.7 C)     Temp Source Oral     SpO2 96 %     Weight 250 lb (113.4 kg)     Height 6\' 2"  (1.88 m)     Head Circumference      Peak Flow      Pain Score 0     Pain Loc      Pain Education      Exclude from Growth Chart     Most recent vital signs: Vitals:   04/04/23 1345 04/04/23 1714  BP: (!) 151/99 128/89  Pulse: (!) 125 83  Resp: 17 15  Temp: 98.1 F (36.7 C)   SpO2: 96% 98%    General: Awake, no distress.  CV:  Good peripheral perfusion.  Regular rate and rhythm, heart rate 90 Resp:  Normal effort.  Clear to auscultation bilaterally Abd:  No distention.  Soft without focal tenderness Other:  Dry oral mucosa   ED Results / Procedures / Treatments   Labs (all labs ordered are listed, but only abnormal results are displayed) Labs Reviewed  COMPREHENSIVE METABOLIC PANEL - Abnormal;  Notable for the following components:      Result Value   CO2 21 (*)    AST 50 (*)    ALT 99 (*)    Total Bilirubin 1.6 (*)    All other components within normal limits  CBC - Abnormal; Notable for the following components:   WBC 12.0 (*)    All other components within normal limits  ETHANOL  LIPASE, BLOOD  URINE DRUG SCREEN, QUALITATIVE ([facility] ONLY)     EKG Interpreted by me Sinus tachycardia rate 120.  Right axis, normal intervals.  Normal QRS ST segments and T waves.   RADIOLOGY    PROCEDURES:  Procedures   MEDICATIONS ORDERED IN ED: Medications  sodium chloride 0.9 % bolus 1,000 mL (1,000 mLs Intravenous New Bag/Given 04/04/23 1710)  ondansetron (ZOFRAN) injection 4 mg (4 mg Intravenous Given 04/04/23 1711)  pantoprazole (PROTONIX) injection 40 mg (40 mg Intravenous Given 04/04/23 1710)     IMPRESSION / MDM / ASSESSMENT AND PLAN / ED COURSE  I reviewed the  triage vital signs and the nursing notes.  DDx: Dehydration, AKI, electrolyte abnormality, pancreatitis, gastritis  Patient's presentation is most consistent with acute presentation with potential threat to life or bodily function.  Patient presents with polysubstance abuse, initially tachycardic and does appear dehydrated.  Labs are reassuring, exam is benign.  Patient given IV fluids and supportive care for his dehydration and likely gastritis.  Will consult psychiatry due to his substance abuse and psychiatric history and passive SI.  Not committable at this time.  The patient has been placed in psychiatric observation due to the need to provide a safe environment for the patient while obtaining psychiatric consultation and evaluation, as well as ongoing medical and medication management to treat the patient's condition.  The patient has not been placed under full IVC at this time.        FINAL CLINICAL IMPRESSION(S) / ED DIAGNOSES   Final diagnoses:  Polysubstance abuse (HCC)  Passive suicidal  ideations  Dehydration     Rx / DC Orders   ED Discharge Orders     None        Note:  This document was prepared using Dragon voice recognition software and may include unintentional dictation errors.   Sharman Cheek, MD 04/04/23 [phone]

## 2023-04-04 NOTE — ED Triage Notes (Signed)
Pt sts that he been on a alcohol and drug binge for the last four days. Pt sts that his drug of choice is cocaine. Pt sts that he is not SI or HI. Pt sts that he has not taken his medication as directed due to the fact the the drugs and alcohol are working better for him.
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
## 2023-04-04 NOTE — Consult Note (Signed)
Telepsych Consultation   Reason for Consult:  Psych Evaluation Referring Physician:  Dr. Scotty Court Location of Patient: [facility] ER Location of Provider: Behavioral Health TTS Department  Patient Identification: Cody Young. MRN:  [id number] Principal Diagnosis: Alcohol abuse Diagnosis:  Principal Problem:   Alcohol abuse Active Problems:   Cocaine abuse (HCC)   Total Time spent with patient: 45 minutes  Subjective:   " Got wound up smoke a whole bunch of cocaine and alcohol"  HPI:  Tele psych Assessment   Cody Broach., 32 y.o., male patient  with a hx of cocaine abuse, alcohol abuse, and bipolar affective disorder, seen via tele health by TTS and this provider; chart reviewed and consulted with Dr.  Scotty Court on 04/04/23.  On evaluation Cody Young. Reports that he relapsed on cocaine and alcohol.    Patient reports that he was prescribed Buspar, mirtazpine, abilify the injection, cymbalta and prazosin.  Last time he reports taking his medication was "last Thursday" because he began using.  He has actt team through [employer].   [facility] recommended he detox for 7 days before attending his program which consist of meetings.  He admits to missing some meetings, which he feels really bad about.   He says he really wants help because he doesn't want to die and feels that the rate of his usage, he may accidentally die.  He endorses AVH. He says he hears his dad's voice and sees potholes and other things that are not there.  Currently he lives with his mother.  He says he is dealing with a lot.    He admits to drinking two twelve packs daily.  Last time he drank was Saturday.  Last cocaine use was "this morning". He says if he were discharged he would intentionally try to overdose.  Last SA was in July where he took "a bunch of pills".  He has a plan to drink and use cocaine until it kills him.    Per TTS,  during assessment patient appears alert and oriented x4, calm  and cooperative. Patient reports "I got wound up smoking cocaine and drinking alcohol, I'm afraid I will OD and kill myself." Patient reports receiving past treatment from [facility] and was then referred to [facility] for an ACT team, which he still receives treatment from. Patient also reports some AH/VH, he reports AH "I hear my dad's voice. Patient denies HI.   Marland Kitchen  Recommendations: Psych admission needed for stabilization    Dr. Scotty Court informed of above recommendation and disposition  Past Psychiatric History: Cocaine/alcohol abuse  Risk to Self:  yes Risk to Others:  no Prior Inpatient Therapy:  yes Prior Outpatient Therapy:  no  Past Medical History:  Past Medical History:  Diagnosis Date   Bipolar 1 disorder (HCC)     Past Surgical History:  Procedure Laterality Date   BRAIN SURGERY     Family History: No family history on file. Family Psychiatric  History: unknown Social History:  Social History   Substance and Sexual Activity  Alcohol Use Yes   Alcohol/week: 12.0 standard drinks of alcohol   Types: 12 Cans of beer per week   Comment: Currently drinks mad dogs     Social History   Substance and Sexual Activity  Drug Use Yes   Types: Marijuana, Cocaine, "Crack" cocaine   Comment: deneis current cocaine use    Social History   Socioeconomic History   Marital status: Single  Spouse name: Not on file   Number of children: Not on file   Years of education: Not on file   Highest education level: Not on file  Occupational History   Not on file  Tobacco Use   Smoking status: Every Day    Current packs/day: 1.00    Average packs/day: 1 pack/day for 20.0 years (20.0 ttl pk-yrs)    Types: Cigarettes   Smokeless tobacco: Never  Substance and Sexual Activity   Alcohol use: Yes    Alcohol/week: 12.0 standard drinks of alcohol    Types: 12 Cans of beer per week    Comment: Currently drinks mad dogs   Drug use: Yes    Types: Marijuana, Cocaine, "Crack"  cocaine    Comment: deneis current cocaine use   Sexual activity: Yes    Birth control/protection: Condom  Other Topics Concern   Not on file  Social History Narrative   Not on file   Social Determinants of Health   Financial Resource Strain: Not on file  Food Insecurity: No Food Insecurity (08/27/2022)   Hunger Vital Sign    Worried About Running Out of Food in the Last Year: Never true    Ran Out of Food in the Last Year: Never true  Transportation Needs: No Transportation Needs (08/27/2022)   PRAPARE - Administrator, Civil Service (Medical): No    Lack of Transportation (Non-Medical): No  Physical Activity: Not on file  Stress: Not on file  Social Connections: Not on file   Additional Social History:    Allergies:  No Known Allergies  Labs:  Results for orders placed or performed during the hospital encounter of 04/04/23 (from the past 48 hour(s))  Comprehensive metabolic panel     Status: Abnormal   Collection Time: 04/04/23  1:47 PM  Result Value Ref Range   Sodium 138 135 - 145 mmol/L   Potassium 3.6 3.5 - 5.1 mmol/L   Chloride 103 98 - 111 mmol/L   CO2 21 (L) 22 - 32 mmol/L   Glucose, Bld 99 70 - 99 mg/dL    Comment: Glucose reference range applies only to samples taken after fasting for at least 8 hours.   BUN 17 6 - 20 mg/dL   Creatinine, Ser 9.56 0.61 - 1.24 mg/dL   Calcium 9.3 8.9 - 21.3 mg/dL   Total Protein 8.0 6.5 - 8.1 g/dL   Albumin 4.7 3.5 - 5.0 g/dL   AST 50 (H) 15 - 41 U/L   ALT 99 (H) 0 - 44 U/L   Alkaline Phosphatase 53 38 - 126 U/L   Total Bilirubin 1.6 (H) 0.3 - 1.2 mg/dL   GFR, Estimated >08 >65 mL/min    Comment: (NOTE) Calculated using the CKD-EPI Creatinine Equation (2021)    Anion gap 14 5 - 15    Comment: Performed at [facility], [address] [place], [place] [address]  Ethanol     Status: None   Collection Time: 04/04/23  1:47 PM  Result Value Ref Range   Alcohol, Ethyl (B) <10 <10 mg/dL    Comment:  (NOTE) Lowest detectable limit for serum alcohol is 10 mg/dL.  For medical purposes only. Performed at [facility], [address] [place], [place] [address]   cbc     Status: Abnormal   Collection Time: 04/04/23  1:47 PM  Result Value Ref Range   WBC 12.0 (H) 4.0 - 10.5 K/uL   RBC 5.42 4.22 -  5.81 MIL/uL   Hemoglobin 16.4 13.0 - 17.0 g/dL   HCT 16.1 09.6 - 04.5 %   MCV 90.4 80.0 - 100.0 fL   MCH 30.3 26.0 - 34.0 pg   MCHC 33.5 30.0 - 36.0 g/dL   RDW 40.9 81.1 - 91.4 %   Platelets 278 150 - 400 K/uL   nRBC 0.0 0.0 - 0.2 %    Comment: Performed at [facility], [address] [place], [place] [address]  Lipase, blood     Status: None   Collection Time: 04/04/23  2:09 PM  Result Value Ref Range   Lipase 22 11 - 51 U/L    Comment: Performed at [facility], [address] [place], [place] [address]    Medications:  Current Facility-Administered Medications  Medication Dose Route Frequency Provider Last Rate Last Admin   alum & mag hydroxide-simeth (MAALOX/MYLANTA) 200-200-20 MG/5ML suspension 30 mL  30 mL Oral Q6H PRN Sharman Cheek, MD       [START ON 04/05/2023] folic acid (FOLVITE) tablet 1 mg  1 mg Oral Daily Zinnia Tindall M, NP       ibuprofen (ADVIL) tablet 600 mg  600 mg Oral Q8H PRN Sharman Cheek, MD       LORazepam (ATIVAN) tablet 1-4 mg  1-4 mg Oral Q1H PRN Jearld Lesch, NP       Or   LORazepam (ATIVAN) tablet 1 mg  1 mg Oral Q1H PRN Jearld Lesch, NP       [START ON 04/05/2023] multivitamin with minerals tablet 1 tablet  1 tablet Oral Daily Tymel Conely M, NP       ondansetron (ZOFRAN) tablet 4 mg  4 mg Oral Q8H PRN Sharman Cheek, MD       thiamine (VITAMIN B1) tablet 100 mg  100 mg Oral Daily Aimie Wagman M, NP       Or   thiamine (VITAMIN B1) injection 100 mg  100 mg Intravenous Daily Jearld Lesch, NP       Current Outpatient Medications  Medication Sig Dispense Refill   ARIPiprazole (ABILIFY) 10 MG  tablet Take 1 tablet (10 mg total) by mouth daily. 30 tablet 0   ARIPiprazole (ABILIFY) 10 MG tablet Take 1 tablet (10 mg total) by mouth at bedtime. 30 tablet 1   divalproex (DEPAKOTE ER) 500 MG 24 hr tablet Take 2 tablets (1,000 mg total) by mouth at bedtime. 60 tablet 0   divalproex (DEPAKOTE ER) 500 MG 24 hr tablet Take 2 tablets (1,000 mg total) by mouth at bedtime. 60 tablet 1   DULoxetine (CYMBALTA) 30 MG capsule Take 1 capsule (30 mg total) by mouth daily. 30 capsule 0   DULoxetine (CYMBALTA) 30 MG capsule Take 1 capsule (30 mg total) by mouth at bedtime. 30 capsule 1   nicotine (NICODERM CQ - DOSED IN MG/24 HOURS) 14 mg/24hr patch Place 1 patch (14 mg total) onto the skin daily. 14 patch 0   nicotine polacrilex (NICORETTE) 2 MG gum Take 1 each (2 mg total) by mouth as needed for smoking cessation. 100 tablet 0   traZODone (DESYREL) 150 MG tablet Take 1 tablet (150 mg total) by mouth at bedtime as needed for sleep. 30 tablet 0    Musculoskeletal: Strength & Muscle Tone: within normal limits Gait & Station: normal Patient leans: N/A  Psychiatric Specialty Exam:  Presentation  General Appearance:  Appropriate for Environment; Casual; Fairly Groomed  Eye Contact: Good  Speech: Normal Rate;  Clear and Coherent  Speech Volume: Normal  Handedness: Right   Mood and Affect  Mood: Euthymic  Affect: Appropriate; Congruent; Full Range   Thought Process  Thought Processes: Linear  Descriptions of Associations:Intact  Orientation:Full (Time, Place and Person)  Thought Content:Logical  History of Schizophrenia/Schizoaffective disorder:Yes  Duration of Psychotic Symptoms:Greater than six months  Hallucinations:No data recorded Ideas of Reference:None  Suicidal Thoughts:No data recorded Homicidal Thoughts:No data recorded  Sensorium  Memory: Immediate Good; Recent Good; Remote Good  Judgment: Fair  Insight: Fair   Art therapist   Concentration: Fair  Attention Span: Fair  Recall: Good  Fund of Knowledge: Good  Language: Good   Psychomotor Activity  Psychomotor Activity:No data recorded  Assets  Assets: Desire for Improvement; Communication Skills; Social Support   Sleep  Sleep:No data recorded   Physical Exam: Physical Exam Vitals and nursing note reviewed.    ROS Blood pressure 128/89, pulse 83, temperature 98.1 F (36.7 C), temperature source Oral, resp. rate 15, height 6\' 2"  (1.88 m), weight 113.4 kg, SpO2 98%. Body mass index is 32.1 kg/m.  Treatment Plan Summary: Daily contact with patient to assess and evaluate symptoms and progress in treatment, Medication management, and Plan  Prentice Sackrider. was admitted to [facility] ER for Alcohol abuse, crisis management, and stabilization. Routine labs ordered, which include Lab Orders         Comprehensive metabolic panel         Ethanol         cbc         Urine Drug Screen, Qualitative         Lipase, blood    Medication Management: Medications started  [START ON 04/05/2023] folic acid  1 mg Oral Daily   [START ON 04/05/2023] multivitamin with minerals  1 tablet Oral Daily   thiamine  100 mg Oral Daily   Or   thiamine  100 mg Intravenous Daily   Will maintain observation checks every 15 minutes for safety. Psychosocial education regarding relapse prevention and self-care; social and communication  Social work will consult with family for collateral information and discuss discharge and follow up plan.  Disposition: Recommend psychiatric Inpatient admission when medically cleared. Supportive therapy provided about ongoing stressors. Discussed crisis plan, support from social network, calling 911, coming to the Emergency Department, and calling Suicide Hotline.  This service was provided via telemedicine using a 2-way, interactive audio and video technology.   Jearld Lesch, NP 04/04/2023 11:24 PM

## 2023-04-04 NOTE — ED Notes (Signed)
Vol /psych consult ordered/ pending

## 2023-04-05 ENCOUNTER — Encounter (HOSPITAL_COMMUNITY): Payer: Self-pay | Admitting: Psychiatric/Mental Health

## 2023-04-05 ENCOUNTER — Inpatient Hospital Stay (HOSPITAL_COMMUNITY)
Admission: AD | Admit: 2023-04-05 | Discharge: 2023-04-13 | DRG: 885 | Disposition: A | Discharge status: Home / Self Care | Payer: MEDICAID | Source: Intra-hospital | Attending: Psychiatry | Admitting: Psychiatry

## 2023-04-05 DIAGNOSIS — R45851 Suicidal ideations: Secondary | ICD-10-CM | POA: Diagnosis present

## 2023-04-05 DIAGNOSIS — F1090 Alcohol use, unspecified, uncomplicated: Secondary | ICD-10-CM | POA: Diagnosis not present

## 2023-04-05 DIAGNOSIS — G4700 Insomnia, unspecified: Secondary | ICD-10-CM | POA: Diagnosis present

## 2023-04-05 DIAGNOSIS — F411 Generalized anxiety disorder: Secondary | ICD-10-CM | POA: Insufficient documentation

## 2023-04-05 DIAGNOSIS — Z23 Encounter for immunization: Secondary | ICD-10-CM

## 2023-04-05 DIAGNOSIS — Z818 Family history of other mental and behavioral disorders: Secondary | ICD-10-CM | POA: Diagnosis not present

## 2023-04-05 DIAGNOSIS — F1210 Cannabis abuse, uncomplicated: Secondary | ICD-10-CM | POA: Diagnosis present

## 2023-04-05 DIAGNOSIS — F17210 Nicotine dependence, cigarettes, uncomplicated: Secondary | ICD-10-CM | POA: Diagnosis present

## 2023-04-05 DIAGNOSIS — F410 Panic disorder [episodic paroxysmal anxiety] without agoraphobia: Secondary | ICD-10-CM | POA: Diagnosis present

## 2023-04-05 DIAGNOSIS — F1590 Other stimulant use, unspecified, uncomplicated: Secondary | ICD-10-CM | POA: Diagnosis not present

## 2023-04-05 DIAGNOSIS — R456 Violent behavior: Secondary | ICD-10-CM | POA: Diagnosis present

## 2023-04-05 DIAGNOSIS — Z9151 Personal history of suicidal behavior: Secondary | ICD-10-CM | POA: Diagnosis not present

## 2023-04-05 DIAGNOSIS — Z79899 Other long term (current) drug therapy: Secondary | ICD-10-CM | POA: Diagnosis not present

## 2023-04-05 DIAGNOSIS — F29 Unspecified psychosis not due to a substance or known physiological condition: Secondary | ICD-10-CM | POA: Diagnosis not present

## 2023-04-05 DIAGNOSIS — Z1152 Encounter for screening for COVID-19: Secondary | ICD-10-CM | POA: Diagnosis not present

## 2023-04-05 DIAGNOSIS — K219 Gastro-esophageal reflux disease without esophagitis: Secondary | ICD-10-CM | POA: Diagnosis present

## 2023-04-05 DIAGNOSIS — F319 Bipolar disorder, unspecified: Principal | ICD-10-CM

## 2023-04-05 DIAGNOSIS — F1190 Opioid use, unspecified, uncomplicated: Secondary | ICD-10-CM | POA: Insufficient documentation

## 2023-04-05 DIAGNOSIS — F17200 Nicotine dependence, unspecified, uncomplicated: Secondary | ICD-10-CM | POA: Diagnosis not present

## 2023-04-05 DIAGNOSIS — F1123 Opioid dependence with withdrawal: Secondary | ICD-10-CM | POA: Diagnosis present

## 2023-04-05 DIAGNOSIS — F1010 Alcohol abuse, uncomplicated: Secondary | ICD-10-CM | POA: Diagnosis not present

## 2023-04-05 DIAGNOSIS — F1310 Sedative, hypnotic or anxiolytic abuse, uncomplicated: Secondary | ICD-10-CM | POA: Diagnosis present

## 2023-04-05 DIAGNOSIS — F1410 Cocaine abuse, uncomplicated: Secondary | ICD-10-CM | POA: Diagnosis present

## 2023-04-05 LAB — URINE DRUG SCREEN, QUALITATIVE (ARMC ONLY)
Amphetamines, Ur Screen: NOT DETECTED
Barbiturates, Ur Screen: NOT DETECTED
Benzodiazepine, Ur Scrn: POSITIVE — AB
Cannabinoid 50 Ng, Ur ~~LOC~~: POSITIVE — AB
Cocaine Metabolite,Ur ~~LOC~~: POSITIVE — AB
MDMA (Ecstasy)Ur Screen: NOT DETECTED
Methadone Scn, Ur: NOT DETECTED
Opiate, Ur Screen: NOT DETECTED
Phencyclidine (PCP) Ur S: NOT DETECTED
Tricyclic, Ur Screen: NOT DETECTED

## 2023-04-05 MED ORDER — NICOTINE 21 MG/24HR TD PT24
21.0000 mg | MEDICATED_PATCH | Freq: Every day | TRANSDERMAL | Status: DC
  Administered 2023-04-06 – 2023-04-13 (×8): 21 mg via TRANSDERMAL
  Filled 2023-04-05 (×10): qty 1

## 2023-04-05 MED ORDER — DIPHENHYDRAMINE HCL 25 MG PO CAPS: 50.0000 mg | ORAL_CAPSULE | Freq: Three times a day (TID) | ORAL | Status: DC | PRN

## 2023-04-05 MED ORDER — HYDROXYZINE HCL 25 MG PO TABS
25.0000 mg | ORAL_TABLET | Freq: Three times a day (TID) | ORAL | Status: DC | PRN
  Administered 2023-04-06 – 2023-04-12 (×11): 25 mg via ORAL
  Filled 2023-04-05 (×11): qty 1

## 2023-04-05 MED ORDER — ALUM & MAG HYDROXIDE-SIMETH 200-200-20 MG/5ML PO SUSP: 30.0000 mL | ORAL | Status: DC | PRN

## 2023-04-05 MED ORDER — CLONIDINE HCL 0.1 MG PO TABS
0.1000 mg | ORAL_TABLET | Freq: Every day | ORAL | Status: AC
  Administered 2023-04-10 – 2023-04-11 (×2): 0.1 mg via ORAL
  Filled 2023-04-05 (×2): qty 1

## 2023-04-05 MED ORDER — LORAZEPAM 1 MG PO TABS
1.0000 mg | ORAL_TABLET | Freq: Every day | ORAL | Status: AC
  Administered 2023-04-09: 1 mg via ORAL
  Filled 2023-04-05: qty 1

## 2023-04-05 MED ORDER — MAGNESIUM HYDROXIDE 400 MG/5ML PO SUSP: 30.0000 mL | Freq: Every day | ORAL | Status: DC | PRN

## 2023-04-05 MED ORDER — INFLUENZA VIRUS VACC SPLIT PF (FLUZONE) 0.5 ML IM SUSY
0.5000 mL | PREFILLED_SYRINGE | INTRAMUSCULAR | Status: AC
  Administered 2023-04-07: 0.5 mL via INTRAMUSCULAR
  Filled 2023-04-05: qty 0.5

## 2023-04-05 MED ORDER — DIVALPROEX SODIUM ER 500 MG PO TB24
1000.0000 mg | ORAL_TABLET | Freq: Every day | ORAL | Status: DC
  Filled 2023-04-05: qty 2

## 2023-04-05 MED ORDER — LORAZEPAM 1 MG PO TABS: 1.0000 mg | ORAL_TABLET | ORAL | Status: AC | PRN

## 2023-04-05 MED ORDER — CLONIDINE HCL 0.1 MG PO TABS
0.1000 mg | ORAL_TABLET | ORAL | Status: DC | PRN
  Filled 2023-04-05: qty 1

## 2023-04-05 MED ORDER — LORAZEPAM 1 MG PO TABS: 2.0000 mg | ORAL_TABLET | Freq: Three times a day (TID) | ORAL | Status: DC | PRN

## 2023-04-05 MED ORDER — NICOTINE POLACRILEX 2 MG MT GUM: 2.0000 mg | CHEWING_GUM | OROMUCOSAL | Status: DC | PRN

## 2023-04-05 MED ORDER — DULOXETINE HCL 30 MG PO CPEP
30.0000 mg | ORAL_CAPSULE | Freq: Every day | ORAL | Status: DC
  Filled 2023-04-05: qty 1

## 2023-04-05 MED ORDER — ARIPIPRAZOLE 10 MG PO TABS
10.0000 mg | ORAL_TABLET | Freq: Every day | ORAL | Status: DC
  Filled 2023-04-05: qty 1

## 2023-04-05 MED ORDER — LOPERAMIDE HCL 2 MG PO CAPS: 2.0000 mg | ORAL_CAPSULE | ORAL | Status: AC | PRN

## 2023-04-05 MED ORDER — MIRTAZAPINE 15 MG PO TABS
15.0000 mg | ORAL_TABLET | Freq: Every day | ORAL | Status: DC
  Administered 2023-04-05: 15 mg via ORAL
  Filled 2023-04-05 (×5): qty 1

## 2023-04-05 MED ORDER — HALOPERIDOL LACTATE 5 MG/ML IJ SOLN: 5.0000 mg | Freq: Three times a day (TID) | INTRAMUSCULAR | Status: DC | PRN

## 2023-04-05 MED ORDER — CLONIDINE HCL 0.1 MG PO TABS
0.1000 mg | ORAL_TABLET | ORAL | Status: DC | PRN
  Administered 2023-04-12 – 2023-04-13 (×2): 0.1 mg via ORAL
  Filled 2023-04-05: qty 1

## 2023-04-05 MED ORDER — METHOCARBAMOL 500 MG PO TABS
500.0000 mg | ORAL_TABLET | Freq: Three times a day (TID) | ORAL | Status: AC | PRN
  Administered 2023-04-05 – 2023-04-09 (×6): 500 mg via ORAL
  Filled 2023-04-05 (×6): qty 1

## 2023-04-05 MED ORDER — LORAZEPAM 1 MG PO TABS
1.0000 mg | ORAL_TABLET | Freq: Three times a day (TID) | ORAL | Status: AC
  Administered 2023-04-07 (×3): 1 mg via ORAL
  Filled 2023-04-05 (×3): qty 1

## 2023-04-05 MED ORDER — THIAMINE HCL 100 MG/ML IJ SOLN: 100.0000 mg | Freq: Every day | INTRAMUSCULAR | Status: DC

## 2023-04-05 MED ORDER — CLONIDINE HCL 0.1 MG PO TABS
0.1000 mg | ORAL_TABLET | Freq: Four times a day (QID) | ORAL | Status: AC
  Administered 2023-04-05 – 2023-04-07 (×10): 0.1 mg via ORAL
  Filled 2023-04-05 (×14): qty 1

## 2023-04-05 MED ORDER — ONDANSETRON HCL 4 MG PO TABS: 4.0000 mg | ORAL_TABLET | Freq: Three times a day (TID) | ORAL | Status: DC | PRN

## 2023-04-05 MED ORDER — ADULT MULTIVITAMIN W/MINERALS CH
1.0000 | ORAL_TABLET | Freq: Every day | ORAL | Status: DC
  Administered 2023-04-06 – 2023-04-13 (×8): 1 via ORAL
  Filled 2023-04-05 (×11): qty 1

## 2023-04-05 MED ORDER — TRAZODONE HCL 150 MG PO TABS
150.0000 mg | ORAL_TABLET | Freq: Every evening | ORAL | Status: DC | PRN
  Administered 2023-04-05: 150 mg via ORAL
  Filled 2023-04-05: qty 1

## 2023-04-05 MED ORDER — PRAZOSIN HCL 2 MG PO CAPS
2.0000 mg | ORAL_CAPSULE | Freq: Every day | ORAL | Status: DC
  Administered 2023-04-05: 2 mg via ORAL
  Filled 2023-04-05: qty 1
  Filled 2023-04-05: qty 2
  Filled 2023-04-05 (×2): qty 1

## 2023-04-05 MED ORDER — CLONIDINE HCL 0.1 MG PO TABS
0.1000 mg | ORAL_TABLET | ORAL | Status: AC
  Administered 2023-04-08 – 2023-04-09 (×4): 0.1 mg via ORAL
  Filled 2023-04-05 (×4): qty 1

## 2023-04-05 MED ORDER — ACETAMINOPHEN 325 MG PO TABS
650.0000 mg | ORAL_TABLET | Freq: Four times a day (QID) | ORAL | Status: DC | PRN
  Administered 2023-04-05 – 2023-04-12 (×6): 650 mg via ORAL
  Filled 2023-04-05 (×6): qty 2

## 2023-04-05 MED ORDER — NAPROXEN 500 MG PO TABS
500.0000 mg | ORAL_TABLET | Freq: Two times a day (BID) | ORAL | Status: AC | PRN
  Administered 2023-04-06 – 2023-04-09 (×4): 500 mg via ORAL
  Filled 2023-04-05 (×4): qty 1

## 2023-04-05 MED ORDER — DIPHENHYDRAMINE HCL 50 MG/ML IJ SOLN: 50.0000 mg | Freq: Three times a day (TID) | INTRAMUSCULAR | Status: DC | PRN

## 2023-04-05 MED ORDER — LORAZEPAM 1 MG PO TABS
1.0000 mg | ORAL_TABLET | Freq: Two times a day (BID) | ORAL | Status: AC
  Administered 2023-04-08 (×2): 1 mg via ORAL
  Filled 2023-04-05 (×2): qty 1

## 2023-04-05 MED ORDER — LORAZEPAM 2 MG/ML IJ SOLN: 2.0000 mg | Freq: Three times a day (TID) | INTRAMUSCULAR | Status: DC | PRN

## 2023-04-05 MED ORDER — DICYCLOMINE HCL 20 MG PO TABS
20.0000 mg | ORAL_TABLET | Freq: Four times a day (QID) | ORAL | Status: AC | PRN
  Administered 2023-04-08: 20 mg via ORAL
  Filled 2023-04-05: qty 1

## 2023-04-05 MED ORDER — LORAZEPAM 1 MG PO TABS
1.0000 mg | ORAL_TABLET | Freq: Four times a day (QID) | ORAL | Status: AC
  Administered 2023-04-05 – 2023-04-06 (×6): 1 mg via ORAL
  Filled 2023-04-05 (×6): qty 1

## 2023-04-05 MED ORDER — VITAMIN B-1 100 MG PO TABS
100.0000 mg | ORAL_TABLET | Freq: Every day | ORAL | Status: DC
  Administered 2023-04-06 – 2023-04-13 (×8): 100 mg via ORAL
  Filled 2023-04-05 (×10): qty 1

## 2023-04-05 MED ORDER — PNEUMOCOCCAL 20-VAL CONJ VACC 0.5 ML IM SUSY
0.5000 mL | PREFILLED_SYRINGE | INTRAMUSCULAR | Status: AC
  Administered 2023-04-07: 0.5 mL via INTRAMUSCULAR
  Filled 2023-04-05: qty 0.5

## 2023-04-05 MED ORDER — HALOPERIDOL 5 MG PO TABS: 5.0000 mg | ORAL_TABLET | Freq: Three times a day (TID) | ORAL | Status: DC | PRN

## 2023-04-05 MED ORDER — NICOTINE 14 MG/24HR TD PT24
14.0000 mg | MEDICATED_PATCH | Freq: Every day | TRANSDERMAL | Status: DC
  Administered 2023-04-05: 14 mg via TRANSDERMAL
  Filled 2023-04-05 (×3): qty 1

## 2023-04-05 NOTE — ED Notes (Signed)
Attempted to call report to [facility] [phone]. Nurse in meeting and will call back.

## 2023-04-05 NOTE — BHH Suicide Risk Assessment (Signed)
[facility] Admission Suicide Risk Assessment   Nursing information obtained from:  Patient Demographic factors:  Male, Caucasian, Adolescent or young adult, Low socioeconomic status, Unemployed Current Mental Status:  Suicidal ideation indicated by others Loss Factors:  NA Historical Factors:  Impulsivity Risk Reduction Factors:  Sense of responsibility to family, Living with another person, especially a relative  Total Time spent with patient: 30 minutes Principal Problem: Bipolar 1 disorder (HCC) Diagnosis:  Principal Problem:   Bipolar 1 disorder (HCC) Active Problems:   Stimulant use disorder   Alcohol use disorder   Opioid use disorder   Tobacco use disorder   GAD (generalized anxiety disorder)  Subjective Data:  See H&P.  Patient admitted suicidal thoughts and worsening depression in the context of medication nonadherence multiple psychosocial stressors and relapse on substance use.  Patient clarifies that he had no intention to actually harm himself but stated that if he were not to have been, he would have overdosed on drugs but this would not have been intentional.  Continued Clinical Symptoms:  Alcohol Use Disorder Identification Test Final Score (AUDIT): 32 The "Alcohol Use Disorders Identification Test", Guidelines for Use in Primary Care, Second Edition.  World Science writer [facility]). Score between 0-7:  no or low risk or alcohol related problems. Score between 8-15:  moderate risk of alcohol related problems. Score between 16-19:  high risk of alcohol related problems. Score 20 or above:  warrants further diagnostic evaluation for alcohol dependence and treatment.   CLINICAL FACTORS:   Severe Anxiety and/or Agitation Panic Attacks Bipolar Disorder:   Depressive phase Alcohol/Substance Abuse/Dependencies More than one psychiatric diagnosis Currently Psychotic Unstable or Poor Therapeutic Relationship Previous Psychiatric Diagnoses and  Treatments   Musculoskeletal: Strength & Muscle Tone: within normal limits Gait & Station: normal Patient leans: N/A  Psychiatric Specialty Exam:  Presentation  General Appearance:  Disheveled (odorous)  Eye Contact: Fair  Speech: Normal Rate  Speech Volume: Decreased  Handedness: Right   Mood and Affect  Mood: Anxious; Depressed  Affect: Depressed   Thought Process  Thought Processes: Linear  Descriptions of Associations:Intact  Orientation:Full (Time, Place and Person)  Thought Content:Logical  History of Schizophrenia/Schizoaffective disorder:No  Duration of Psychotic Symptoms:Greater than six months  Hallucinations:Hallucinations: Visual  Ideas of Reference:Paranoia  Suicidal Thoughts:Suicidal Thoughts: Yes, Passive SI Passive Intent and/or Plan: Without Intent; Without Plan  Homicidal Thoughts:Homicidal Thoughts: No   Sensorium  Memory: Immediate Fair; Recent Fair; Remote Good  Judgment: Impaired  Insight: Lacking   Executive Functions  Concentration: Poor  Attention Span: Poor  Recall: Good  Fund of Knowledge: Good  Language: Good   Psychomotor Activity  Psychomotor Activity: Psychomotor Activity: Normal   Assets  Assets: Desire for Improvement; Communication Skills; Social Support   Sleep  Sleep: Sleep: Poor    Physical Exam: Physical Exam See H&P  ROS See H&P  Blood pressure (!) 124/99, pulse 79, temperature 98.3 F (36.8 C), temperature source Oral, resp. rate 18, height 6\' 2"  (1.88 m), weight 123.6 kg, SpO2 100%. Body mass index is 34.97 kg/m.   COGNITIVE FEATURES THAT CONTRIBUTE TO RISK:  None    SUICIDE RISK:   Moderate:  Frequent suicidal ideation with limited intensity, and duration, some specificity in terms of plans, no associated intent, good self-control, limited dysphoria/symptomatology, some risk factors present, and identifiable protective factors, including available and accessible  social support.  PLAN OF CARE: See H&P   I certify that inpatient services furnished can reasonably be expected to improve the patient's  condition.   Cristy Hilts, MD 04/05/2023, 4:12 PM

## 2023-04-05 NOTE — ED Provider Notes (Signed)
-----------------------------------------   10:44 AM on 04/05/2023 ----------------------------------------- Patient has been accepted to an inpatient psychiatric facility.  Patient will be transferred as soon as transport arrives.   Minna Antis, MD 04/05/23 1044

## 2023-04-05 NOTE — ED Notes (Signed)
moved to bhu 3.

## 2023-04-05 NOTE — Group Note (Signed)
Date:  04/05/2023 Time:  12:00 PM  Group Topic/Focus:  Dimensions of Wellness:   The focus of this group is to introduce the topic of wellness and discuss the role each dimension of wellness plays in total health.    Participation Level:  Did Not Attend  Participation Quality:      Affect:      Cognitive:      Insight: None  Engagement in Group:      Modes of Intervention:      Additional Comments:    Beckie Busing 04/05/2023, 12:00 PM

## 2023-04-05 NOTE — ED Notes (Signed)
Patient has been accepted to [facility]  Patient assigned to room 405 bed 1 Accepting physician is Dr. Sherron Flemings.  Call report to [phone].  Representative was [facility] [place].

## 2023-04-05 NOTE — Tx Team (Signed)
Initial Treatment Plan 04/05/2023 1:08 PM Cody Young. ZOX:[id number]    PATIENT STRESSORS: Medication change or noncompliance   Substance abuse     PATIENT STRENGTHS: Forensic psychologist fund of knowledge  Motivation for treatment/growth  Supportive family/friends    PATIENT IDENTIFIED PROBLEMS: Substance abuse  Medication non-compliance                    DISCHARGE CRITERIA:  Improved stabilization in mood, thinking, and/or behavior Need for constant or close observation no longer present  PRELIMINARY DISCHARGE PLAN: Return to previous living arrangement  PATIENT/FAMILY INVOLVEMENT: This treatment plan has been presented to and reviewed with the patient, Cody Birr..  The patient has been given the opportunity to ask questions and make suggestions.  Edwyna Perfect, RN 04/05/2023, 1:08 PM

## 2023-04-05 NOTE — BH Assessment (Signed)
PATIENT BED AVAILABLE AFTER 10AM ON 04/05/23  Patient has been accepted to [facility]  Patient assigned to room 405 bed 1 Accepting physician is Dr. Sherron Flemings.  Call report to [phone].  Representative was [facility] [place].   ER Staff is aware of it:  [facility] ER Secretary  Dr. Elesa Massed, ER MD  Casimiro Needle Patient's Nurse

## 2023-04-05 NOTE — Group Note (Signed)
Date:  04/05/2023 Time:  11:54 AM  Group Topic/Focus:  Goals Group:   The focus of this group is to help patients establish daily goals to achieve during treatment and discuss how the patient can incorporate goal setting into their daily lives to aide in recovery.    Participation Level:  Did Not Attend  Participation Quality:      Affect:      Cognitive:      Insight: None  Engagement in Group:      Modes of Intervention:      Additional Comments:    Beckie Busing 04/05/2023, 11:54 AM

## 2023-04-05 NOTE — BHH Group Notes (Signed)
BHH Group Notes:  (Nursing/MHT/Case Management/Adjunct)  Date:  04/05/2023  Time:  9:56 PM  Type of Therapy:  Group Therapy  Participation Level:  Minimal  Participation Quality:  Attentive  Affect:  Appropriate  Cognitive:  Appropriate  Insight:  Limited  Engagement in Group:  Developing/Improving  Modes of Intervention:  Education  Summary of Progress/Problems: The patient attended the evening A.A.meeting and was appropriate.   Hazle Coca S 04/05/2023, 9:56 PM

## 2023-04-05 NOTE — H&P (Signed)
Psychiatric Admission Assessment Adult  Patient Identification: Cody Young. MRN:  [id number] Date of Evaluation:  04/05/2023 Chief Complaint:  Bipolar 1 disorder (HCC) [F31.9] Principal Diagnosis: Bipolar 1 disorder (HCC) Diagnosis:  Principal Problem:   Bipolar 1 disorder (HCC) Active Problems:   Stimulant use disorder   Alcohol use disorder   Opioid use disorder   Tobacco use disorder   GAD (generalized anxiety disorder)  History of Present Illness:   Patient is a 32 year old male with a reported psychiatric history of bipolar disorder, and multiple substance use disorders, who was admitted to the psychiatric unit for evaluation of worsening depression, and also to manage substance use detoxification including alcohol, opiates, benzodiazepines, and stimulants.  Current patient psychiatric medication regimen: Patient reports stopping his psychiatric medications about 5 days prior to this evaluation.  He reports he was previously taking Cymbalta 60 mg twice daily, Remeron 30 mg nightly, prazosin 2 mg 3 times daily, and BuSpar.  He also reports receiving Abilify LAI towards the end of September (says he was on Monday 9/26).   On my evaluation today, the patient reports that he has been feeling much more depressed for some time.  He reports that he was not actively suicidal leading up to this admission.  He states that he told someone that if he were not taking the hospital that he would overdose, but he did not have any thoughts to intentionally harm himself.  He reports pervasive sadness and anhedonia.  Reports poor sleep.  Reports low appetite.  Reports poor concentration and low energy level.  Reports low motivation.  He denies that he has any thoughts of suicide including passive suicidal thoughts such as life is not worth living or that he would be better off dead at this time.  Denies any HI.  Reports that anxiety is at a very high level, generalized.  Reports having panic  attacks, multiple per day.  He reports having auditory hallucinations of his father's voice for years, denies any CAH.  Reports having visual hallucination of the like bars.  Reports having paranoia going on for many months.  Patient is unsure when his last manic episode was.  Patient reports he has [facility].  Patient reports he stopped all of his medication on Thursday, but not due to running out of medications or having side effects, reports that he started to binge on alcohol at that time, 24 beers per night, taking Xanax, 15 x 0.5 mg bars/tablets, using 3 g of cocaine per night.  He also reports using Percocet and fentanyl leading up to this admission.  He also reports smoking tobacco 1.5 PPD.  Past psychiatric history: Bipolar disorder.  Multiple hospitalizations in the past, most recently in June 2024.  Reports multiple psychiatric medication trials, but does not recall them.  Reports the combination of Abilify, Remeron, Cymbalta, prazosin, BuSpar is mostly helpful for him.  It is unclear if the patient was also prescribed Depakote leading up to this admission, we will order a Depakote level. Patient reports a history of 2 suicide attempts in the past.    Past medical history: Patient reports he thinks he has OSA but has not been tested for this and has never used a CPAP.  Denies any known seizure history.  NKDA.  Family history: Patient reports mother has bipolar disorder and maternal grandmother has schizophrenia.  Substance use history: See above HPI about alcohol, Xanax, opioid, cocaine, tobacco use.    Total Time spent with patient:  30 minutes    Is the patient at risk to self? Yes.    Has the patient been a risk to self in the past 6 months? Yes.    Has the patient been a risk to self within the distant past? Yes.    Is the patient a risk to others? No.  Has the patient been a risk to others in the past 6 months? No.  Has the patient been a risk to others within the  distant past? No.   [place] Scale:  Flowsheet Row Admission (Current) [facility] from 04/04/2023 in [facility] Department [facility] ED from 02/25/2023 in [facility] Emergency Department at [facility]  C-SSRS RISK CATEGORY No Risk No Risk No Risk        Prior Inpatient Therapy: Yes.   If yes, describe x 2 Prior Outpatient Therapy: [facility]    Alcohol Screening: 1. How often do you have a drink containing alcohol?: 4 or more times a week 2. How many drinks containing alcohol do you have on a typical day when you are drinking?: 10 or more 3. How often do you have six or more drinks on one occasion?: Daily or almost daily AUDIT-C Score: 12 4. How often during the last year have you found that you were not able to stop drinking once you had started?: Never 5. How often during the last year have you failed to do what was normally expected from you because of drinking?: Daily or almost daily 6. How often during the last year have you needed a first drink in the morning to get yourself going after a heavy drinking session?: Daily or almost daily 7. How often during the last year have you had a feeling of guilt of remorse after drinking?: Daily or almost daily 8. How often during the last year have you been unable to remember what happened the night before because you had been drinking?: Daily or almost daily 9. Have you or someone else been injured as a result of your drinking?: No 10. Has a relative or friend or a doctor or another health worker been concerned about your drinking or suggested you cut down?: Yes, during the last year Alcohol Use Disorder Identification Test Final Score (AUDIT): 32 Alcohol Brief Interventions/Follow-up: Alcohol education/Brief advice Substance Abuse History in the last 12 months:  Yes.   Consequences of Substance Abuse: Negative Medical Consequences:   Legal  Consequences:   Family Consequences:   Blackouts:   Withdrawal Symptoms:      Past Medical History:  Past Medical History:  Diagnosis Date   Bipolar 1 disorder (HCC)     Past Surgical History:  Procedure Laterality Date   BRAIN SURGERY     Family History: History reviewed. No pertinent family history.  Tobacco Screening:  Social History   Tobacco Use  Smoking Status Every Day   Current packs/day: 1.00   Average packs/day: 1 pack/day for 20.0 years (20.0 ttl pk-yrs)   Types: Cigarettes  Smokeless Tobacco Never    BH Tobacco Counseling     Are you interested in Tobacco Cessation Medications?  Yes, implement Nicotene Replacement Protocol Counseled patient on smoking cessation:  Refused/Declined practical counseling Reason Tobacco Screening Not Completed: Patient Refused Screening       Social History:  Social History   Substance and Sexual Activity  Alcohol Use Yes   Alcohol/week: 12.0 standard drinks of alcohol  Types: 12 Cans of beer per week   Comment: Currently drinks mad dogs     Social History   Substance and Sexual Activity  Drug Use Yes   Types: Marijuana, Cocaine, "Crack" cocaine   Comment: deneis current cocaine use    Additional Social History:                           Allergies:  No Known Allergies Lab Results:  Results for orders placed or performed during the hospital encounter of 04/04/23 (from the past 48 hour(s))  Comprehensive metabolic panel     Status: Abnormal   Collection Time: 04/04/23  1:47 PM  Result Value Ref Range   Sodium 138 135 - 145 mmol/L   Potassium 3.6 3.5 - 5.1 mmol/L   Chloride 103 98 - 111 mmol/L   CO2 21 (L) 22 - 32 mmol/L   Glucose, Bld 99 70 - 99 mg/dL    Comment: Glucose reference range applies only to samples taken after fasting for at least 8 hours.   BUN 17 6 - 20 mg/dL   Creatinine, Ser 9.60 0.61 - 1.24 mg/dL   Calcium 9.3 8.9 - 45.4 mg/dL   Total Protein 8.0 6.5 - 8.1 g/dL   Albumin 4.7 3.5  - 5.0 g/dL   AST 50 (H) 15 - 41 U/L   ALT 99 (H) 0 - 44 U/L   Alkaline Phosphatase 53 38 - 126 U/L   Total Bilirubin 1.6 (H) 0.3 - 1.2 mg/dL   GFR, Estimated >09 >81 mL/min    Comment: (NOTE) Calculated using the CKD-EPI Creatinine Equation (2021)    Anion gap 14 5 - 15    Comment: Performed at [facility], [address] [place], [place] [address]  Ethanol     Status: None   Collection Time: 04/04/23  1:47 PM  Result Value Ref Range   Alcohol, Ethyl (B) <10 <10 mg/dL    Comment: (NOTE) Lowest detectable limit for serum alcohol is 10 mg/dL.  For medical purposes only. Performed at [facility], [address] [place], [place] [address]   cbc     Status: Abnormal   Collection Time: 04/04/23  1:47 PM  Result Value Ref Range   WBC 12.0 (H) 4.0 - 10.5 K/uL   RBC 5.42 4.22 - 5.81 MIL/uL   Hemoglobin 16.4 13.0 - 17.0 g/dL   HCT 21.3 08.6 - 57.8 %   MCV 90.4 80.0 - 100.0 fL   MCH 30.3 26.0 - 34.0 pg   MCHC 33.5 30.0 - 36.0 g/dL   RDW 46.9 62.9 - 52.8 %   Platelets 278 150 - 400 K/uL   nRBC 0.0 0.0 - 0.2 %    Comment: Performed at [facility], [address] [place], [place] [address]  Lipase, blood     Status: None   Collection Time: 04/04/23  2:09 PM  Result Value Ref Range   Lipase 22 11 - 51 U/L    Comment: Performed at [facility], [address] [place], [place] [address]  Urine Drug Screen, Qualitative     Status: Abnormal   Collection Time: 04/05/23 10:51 AM  Result Value Ref Range   Tricyclic, Ur Screen NONE DETECTED NONE DETECTED   Amphetamines, Ur Screen NONE DETECTED NONE DETECTED   MDMA (Ecstasy)Ur Screen NONE DETECTED NONE DETECTED   Cocaine Metabolite,Ur [place] POSITIVE (A) NONE DETECTED   Opiate, Ur Screen NONE DETECTED  NONE DETECTED   Phencyclidine (PCP) Ur S NONE DETECTED NONE DETECTED   Cannabinoid 50 Ng, Ur [place] POSITIVE (A) NONE DETECTED   Barbiturates, Ur Screen NONE DETECTED NONE DETECTED   Benzodiazepine, Ur  Scrn POSITIVE (A) NONE DETECTED   Methadone Scn, Ur NONE DETECTED NONE DETECTED    Comment: (NOTE) Tricyclics + metabolites, urine    Cutoff 1000 ng/mL Amphetamines + metabolites, urine  Cutoff 1000 ng/mL MDMA (Ecstasy), urine              Cutoff 500 ng/mL Cocaine Metabolite, urine          Cutoff 300 ng/mL Opiate + metabolites, urine        Cutoff 300 ng/mL Phencyclidine (PCP), urine         Cutoff 25 ng/mL Cannabinoid, urine                 Cutoff 50 ng/mL Barbiturates + metabolites, urine  Cutoff 200 ng/mL Benzodiazepine, urine              Cutoff 200 ng/mL Methadone, urine                   Cutoff 300 ng/mL  The urine drug screen provides only a preliminary, unconfirmed analytical test result and should not be used for non-medical purposes. Clinical consideration and professional judgment should be applied to any positive drug screen result due to possible interfering substances. A more specific alternate chemical method must be used in order to obtain a confirmed analytical result. Gas chromatography / mass spectrometry (GC/MS) is the preferred confirm atory method. Performed at [facility], [address] [place], [place] [address]     Blood Alcohol level:  Lab Results  Component Value Date   [facility] <10 04/04/2023   ETH <10 12/19/2022    Metabolic Disorder Labs:  Lab Results  Component Value Date   HGBA1C 5.2 09/02/2022   MPG 103 09/02/2022   No results found for: "PROLACTIN" Lab Results  Component Value Date   CHOL 186 09/02/2022   TRIG 94 09/02/2022   HDL 35 (L) 09/02/2022   CHOLHDL 5.3 09/02/2022   VLDL 19 09/02/2022   LDLCALC 132 (H) 09/02/2022    Current Medications: Current Facility-Administered Medications  Medication Dose Route Frequency Provider Last Rate Last Admin   acetaminophen (TYLENOL) tablet 650 mg  650 mg Oral Q6H PRN Jearld Lesch, NP   650 mg at 04/05/23 1428   alum & mag hydroxide-simeth (MAALOX/MYLANTA) 200-200-20 MG/5ML  suspension 30 mL  30 mL Oral Q4H PRN Jearld Lesch, NP       cloNIDine (CATAPRES) tablet 0.1 mg  0.1 mg Oral Q4H PRN Burel Kahre, Harrold Donath, MD       cloNIDine (CATAPRES) tablet 0.1 mg  0.1 mg Oral Q4H PRN Junella Domke, Harrold Donath, MD       cloNIDine (CATAPRES) tablet 0.1 mg  0.1 mg Oral QID Jarid Sasso, Harrold Donath, MD       Followed by   Melene Muller ON 04/08/2023] cloNIDine (CATAPRES) tablet 0.1 mg  0.1 mg Oral BH-qamhs Hero Mccathern, MD       Followed by   Melene Muller ON 04/10/2023] cloNIDine (CATAPRES) tablet 0.1 mg  0.1 mg Oral QAC breakfast Arnetia Bronk, Harrold Donath, MD       dicyclomine (BENTYL) tablet 20 mg  20 mg Oral Q6H PRN Lavanna Rog, MD       diphenhydrAMINE (BENADRYL) capsule 50 mg  50 mg Oral TID PRN Jearld Lesch, NP  Or   diphenhydrAMINE (BENADRYL) injection 50 mg  50 mg Intramuscular TID PRN Dixon, Rashaun M, NP       haloperidol (HALDOL) tablet 5 mg  5 mg Oral TID PRN Jearld Lesch, NP       Or   haloperidol lactate (HALDOL) injection 5 mg  5 mg Intramuscular TID PRN Jearld Lesch, NP       hydrOXYzine (ATARAX) tablet 25 mg  25 mg Oral TID PRN Jearld Lesch, NP       [START ON 04/06/2023] influenza vac split trivalent PF (FLULAVAL) injection 0.5 mL  0.5 mL Intramuscular Tomorrow-1000 Jemmie Ledgerwood, MD       loperamide (IMODIUM) capsule 2-4 mg  2-4 mg Oral PRN Taesha Goodell, Harrold Donath, MD       LORazepam (ATIVAN) tablet 2 mg  2 mg Oral TID PRN Jearld Lesch, NP       Or   LORazepam (ATIVAN) injection 2 mg  2 mg Intramuscular TID PRN Jearld Lesch, NP       LORazepam (ATIVAN) tablet 1-4 mg  1-4 mg Oral Q1H PRN Jearld Lesch, NP       Or   LORazepam (ATIVAN) tablet 1 mg  1 mg Oral Q1H PRN Jearld Lesch, NP       LORazepam (ATIVAN) tablet 1 mg  1 mg Oral QID Markeya Mincy, Harrold Donath, MD       Followed by   Melene Muller ON 04/07/2023] LORazepam (ATIVAN) tablet 1 mg  1 mg Oral TID Phineas Inches, MD       Followed by   Melene Muller ON 04/08/2023] LORazepam (ATIVAN) tablet 1 mg   1 mg Oral BID Shawnise Peterkin, Harrold Donath, MD       Followed by   Melene Muller ON 04/09/2023] LORazepam (ATIVAN) tablet 1 mg  1 mg Oral Daily Teletha Petrea, Harrold Donath, MD       magnesium hydroxide (MILK OF MAGNESIA) suspension 30 mL  30 mL Oral Daily PRN Durwin Nora, Elray Buba, NP       methocarbamol (ROBAXIN) tablet 500 mg  500 mg Oral Q8H PRN Elysse Polidore, Harrold Donath, MD       mirtazapine (REMERON) tablet 15 mg  15 mg Oral QHS Jancarlos Thrun, Harrold Donath, MD       Melene Muller ON 04/06/2023] multivitamin with minerals tablet 1 tablet  1 tablet Oral Daily Dixon, Rashaun M, NP       naproxen (NAPROSYN) tablet 500 mg  500 mg Oral BID PRN Phineas Inches, MD       Melene Muller ON 04/06/2023] nicotine (NICODERM CQ - dosed in mg/24 hours) patch 21 mg  21 mg Transdermal Daily Tenna Lacko, MD       nicotine polacrilex (NICORETTE) gum 2 mg  2 mg Oral PRN Jearld Lesch, NP       ondansetron (ZOFRAN) tablet 4 mg  4 mg Oral Q8H PRN Jearld Lesch, NP       [START ON 04/06/2023] pneumococcal 20-valent conjugate vaccine (PREVNAR 20) injection 0.5 mL  0.5 mL Intramuscular Tomorrow-1000 Jameire Kouba, MD       prazosin (MINIPRESS) capsule 2 mg  2 mg Oral QHS Ashutosh Dieguez, Harrold Donath, MD       Melene Muller ON 04/06/2023] thiamine (Vitamin B-1) tablet 100 mg  100 mg Oral Daily Dixon, Rashaun M, NP       traZODone (DESYREL) tablet 150 mg  150 mg Oral QHS PRN Jearld Lesch, NP       PTA Medications: Medications Prior to Admission  Medication Sig  Dispense Refill Last Dose   ARIPiprazole ER (ABILIFY MAINTENA) 400 MG PRSY prefilled syringe Inject 400 mg into the muscle every 30 (thirty) days.   03/18/2023   busPIRone (BUSPAR) 30 MG tablet Take 30 mg by mouth 2 (two) times daily.   04/01/2023   disulfiram (ANTABUSE) 250 MG tablet Take 250 mg by mouth daily.   04/01/2023   DULoxetine (CYMBALTA) 60 MG capsule Take 60 mg by mouth 2 (two) times daily.   04/01/2023   guanFACINE (INTUNIV) 1 MG TB24 ER tablet Take 1 mg by mouth daily.   04/01/2023   mirtazapine  (REMERON) 30 MG tablet Take 30 mg by mouth at bedtime.   04/01/2023   prazosin (MINIPRESS) 2 MG capsule Take 2 mg by mouth 3 (three) times daily.   04/01/2023   valproic acid (DEPAKENE) 250 MG/5ML solution Take 1,000 mg by mouth 3 (three) times daily.   04/01/2023    Musculoskeletal: Strength & Muscle Tone: within normal limits Gait & Station: normal Patient leans: N/A            Psychiatric Specialty Exam:  Presentation  General Appearance:  Disheveled (odorous)  Eye Contact: Fair  Speech: Normal Rate  Speech Volume: Decreased  Handedness: Right   Mood and Affect  Mood: Anxious; Depressed  Affect: Depressed   Thought Process  Thought Processes: Linear  Duration of Psychotic Symptoms: months  Past Diagnosis of Schizophrenia or Psychoactive disorder: No  Descriptions of Associations:Intact  Orientation:Full (Time, Place and Person)  Thought Content:Logical  Hallucinations:Hallucinations: Visual  Ideas of Reference:Paranoia  Suicidal Thoughts:Suicidal Thoughts: Yes, Passive SI Passive Intent and/or Plan: Without Intent; Without Plan  Homicidal Thoughts:Homicidal Thoughts: No   Sensorium  Memory: Immediate Fair; Recent Fair; Remote Good  Judgment: Impaired  Insight: Lacking   Executive Functions  Concentration: Poor  Attention Span: Poor  Recall: Good  Fund of Knowledge: Good  Language: Good   Psychomotor Activity  Psychomotor Activity: Psychomotor Activity: Normal   Assets  Assets: Desire for Improvement; Communication Skills; Social Support   Sleep  Sleep: Sleep: Poor    Physical Exam: Physical Exam Vitals reviewed.  Pulmonary:     Effort: Pulmonary effort is normal. No respiratory distress.  Neurological:     Mental Status: He is alert.     Motor: No weakness.     Gait: Gait normal.    Review of Systems  Constitutional:  Negative for chills and fever.  Cardiovascular:  Negative for chest  pain and palpitations.  Neurological:  Positive for tremors and headaches. Negative for dizziness and tingling.  Psychiatric/Behavioral:  Positive for depression, hallucinations, memory loss, substance abuse and suicidal ideas. The patient is nervous/anxious and has insomnia.   All other systems reviewed and are negative.  Blood pressure (!) 124/99, pulse 79, temperature 98.3 F (36.8 C), temperature source Oral, resp. rate 18, height 6\' 2"  (1.88 m), weight 123.6 kg, SpO2 100%. Body mass index is 34.97 kg/m.  Treatment Plan Summary: Daily contact with patient to assess and evaluate symptoms and progress in treatment and Medication management  ASSESSMENT:  Diagnoses / Active Problems: Bipolar disorder, current episode is depressed GAD Stimulant use disorder Alcohol use disorder Sedative-hypnotic use disorder Opioid use disorder Tobacco use disorder   PLAN: Safety and Monitoring:  --  Voluntary admission to inpatient psychiatric unit for safety, stabilization and treatment  -- Daily contact with patient to assess and evaluate symptoms and progress in treatment  -- Patient's case to be discussed in multi-disciplinary team meeting  --  Observation Level : q15 minute checks  -- Vital signs:  q12 hours  -- Precautions: suicide, elopement, and assault  2. Psychiatric Diagnoses and Treatment:    -Hold Cymbalta due to elevated LFTs -Hold Depakote due to elevated LFTs -Patient received Abilify LAI within the past 3 to 4 weeks.  We will need to confirm date of last dose, formulation and dose, and decide whether to resume this or not -Discontinue oral Abilify that was restarted on admission -Restart Remeron 15 mg nightly for insomnia -Restart prazosin at 2 mg nightly for nightmares -Hold BuSpar for now  -Start Ativan taper plus as needed Ativan for alcohol and benzodiazepine withdrawal -Start clonidine taper and symptomatic treatment for any opioid withdrawal.  Also starting clonidine  as needed for breakthrough opioid withdrawal symptoms  Repeat LFT, get a valproic acid level, order fasting lipid and A1c, and TSH  Start clonidine PRN for elevated BP with parameters    --  The risks/benefits/side-effects/alternatives to this medication were discussed in detail with the patient and time was given for questions. The patient consents to medication trial.    -- Metabolic profile and EKG monitoring obtained while on an atypical antipsychotic (BMI: Lipid Panel: HbgA1c: QTc:)   -- Encouraged patient to participate in unit milieu and in scheduled group therapies   -- Short Term Goals: Ability to identify changes in lifestyle to reduce recurrence of condition will improve, Ability to verbalize feelings will improve, Ability to disclose and discuss suicidal ideas, Ability to demonstrate self-control will improve, Ability to identify and develop effective coping behaviors will improve, Ability to maintain clinical measurements within normal limits will improve, Compliance with prescribed medications will improve, and Ability to identify triggers associated with substance abuse/mental health issues will improve  -- Long Term Goals: Improvement in symptoms so as ready for discharge    3. Medical Issues Being Addressed:   Tobacco Use Disorder  -- Nicotine patch 21mg /24 hours ordered plus nicotine gum PRN   -- Smoking cessation encouraged  4. Discharge Planning:   -- Social work and case management to assist with discharge planning and identification of hospital follow-up needs prior to discharge  -- Estimated LOS: 5-7 days  -- Discharge Concerns: Need to establish a safety plan; Medication compliance and effectiveness  -- Discharge Goals: Return home with outpatient referrals for mental health follow-up including medication management/psychotherapy   I certify that inpatient services furnished can reasonably be expected to improve the patient's condition.    Cristy Hilts,  MD 10/14/20244:14 PM  Total Time Spent in Direct Patient Care:  I personally spent 60 minutes on the unit in direct patient care. The direct patient care time included face-to-face time with the patient, reviewing the patient's chart, communicating with other professionals, and coordinating care. Greater than 50% of this time was spent in counseling or coordinating care with the patient regarding goals of hospitalization, psycho-education, and discharge planning needs.   Phineas Inches, MD Psychiatrist

## 2023-04-05 NOTE — ED Notes (Signed)
Pt give verbal consent to transfer to [facility] in [place].

## 2023-04-05 NOTE — Plan of Care (Signed)
Problem: Education: Goal: Emotional status will improve Outcome: Progressing Goal: Mental status will improve Outcome: Progressing Goal: Verbalization of understanding the information provided will improve Outcome: Progressing   Problem: Activity: Goal: Interest or engagement in activities will improve Outcome: Progressing   Problem: Coping: Goal: Ability to verbalize frustrations and anger appropriately will improve Outcome: Progressing   Problem: Safety: Goal: Periods of time without injury will increase Outcome: Progressing

## 2023-04-05 NOTE — Progress Notes (Signed)
Pt remembered writer from prior admission and thanked for helping last time he was here    04/05/23 2345  Psych Admission Type (Psych Patients Only)  Admission Status Voluntary  Psychosocial Assessment  Patient Complaints Anxiety;Substance abuse  Eye Contact Fair  Facial Expression Anxious  Affect Anxious  Speech Logical/coherent  Interaction Assertive  Motor Activity Slow  Appearance/Hygiene Unremarkable  Behavior Characteristics Cooperative  Mood Anxious;Pleasant  Aggressive Behavior  Effect No apparent injury  Thought Process  Coherency WDL  Content WDL  Delusions WDL  Perception Hallucinations  Hallucination Auditory;Visual  Judgment Impaired  Confusion WDL  Danger to Self  Current suicidal ideation? Denies  Danger to Others  Danger to Others None reported or observed

## 2023-04-05 NOTE — Progress Notes (Signed)
Patient admitted voluntarily from [facility] for substance abuse, medication non-compliance, and SI with plan to OD. Per report patient is afraid he will OD and kill himself if he leaves the hospital. Patient denies current SI HI. Patient endorses AH of his dad's voice and VH of balls and light bars coming and going. Patient reports he stopped taking his medications 4 days ago because he felt they weren't working. He then started a drug and alcohol binge using cocaine, percocet and xanax. He reports drinking a 12 pack per day for the last 4 days. He does smoke cigarettes and would like a patch while here. Patient reports feeling dizzy and unsteady like he's going to fall often but no recent falls. High fall risk measures implemented. Patient reports he eats a regular diet. Patient blood pressure elevated at 124/99 on admission. He denies any weight loss or poor appetite. He denies any history of abuse. Patient reports his support system is his mom and he lives with his mom. He reports he receives SSI and does not work or drive. He uses medical transportation to get to his appointments and has his medications delivered.  Patient oriented to unit. Patient was able to go to lunch right after admission assessment. Q15 minute safety checks started. Safety maintained.

## 2023-04-05 NOTE — ED Notes (Signed)
Safe Transport called for Transport to [facility]

## 2023-04-06 DIAGNOSIS — F319 Bipolar disorder, unspecified: Secondary | ICD-10-CM | POA: Diagnosis not present

## 2023-04-06 LAB — LIPID PANEL
Cholesterol: 166 mg/dL (ref 0–200)
HDL: 34 mg/dL — ABNORMAL LOW (ref >40)
LDL Cholesterol: 113 mg/dL — ABNORMAL HIGH (ref 0–99)
Total CHOL/HDL Ratio: 4.9 {ratio}
Triglycerides: 96 mg/dL (ref <150)
VLDL: 19 mg/dL (ref 0–40)

## 2023-04-06 LAB — HEMOGLOBIN A1C
Hgb A1c MFr Bld: 5.5 % (ref 4.8–5.6)
Mean Plasma Glucose: 111.15 mg/dL

## 2023-04-06 LAB — HEPATIC FUNCTION PANEL
ALT: 59 U/L — ABNORMAL HIGH (ref 0–44)
AST: 22 U/L (ref 15–41)
Albumin: 4 g/dL (ref 3.5–5.0)
Alkaline Phosphatase: 44 U/L (ref 38–126)
Bilirubin, Direct: 0.1 mg/dL (ref 0.0–0.2)
Indirect Bilirubin: 0.7 mg/dL (ref 0.3–0.9)
Total Bilirubin: 0.8 mg/dL (ref 0.3–1.2)
Total Protein: 6.7 g/dL (ref 6.5–8.1)

## 2023-04-06 LAB — VALPROIC ACID LEVEL: Valproic Acid Lvl: <10 ug/mL — ABNORMAL LOW (ref 50.0–100.0)

## 2023-04-06 LAB — TSH: TSH: 0.971 u[IU]/mL (ref 0.350–4.500)

## 2023-04-06 MED ORDER — TRAZODONE HCL 50 MG PO TABS
50.0000 mg | ORAL_TABLET | Freq: Every evening | ORAL | Status: DC | PRN
  Administered 2023-04-06 – 2023-04-12 (×6): 50 mg via ORAL
  Filled 2023-04-06 (×6): qty 1

## 2023-04-06 NOTE — [facility] Group Notes (Addendum)
Spiritual care group on grief and loss facilitated by Chaplain Dyanne Carrel, [facility]  Group Goal: Support / Education around grief and loss  Members engage in facilitated group support and psycho-social education.  Group Description:  Following introductions and group rules, group members engaged in facilitated group dialogue and support around topic of loss, with particular support around experiences of loss in their lives. Group Identified types of loss (relationships / self / things) and identified patterns, circumstances, and changes that precipitate losses. Reflected on thoughts / feelings around loss, normalized grief responses, and recognized variety in grief experience. Group encouraged individual reflection on safe space and on the coping skills that they are already utilizing.  Group drew on Adlerian / Rogerian and narrative framework  Patient Progress: Cody Young attended group until he was called to meet with a provider.  He was engaged in group conversation for the time that he was present.

## 2023-04-06 NOTE — Progress Notes (Signed)
   04/06/23 1000  Psych Admission Type (Psych Patients Only)  Admission Status Voluntary  Psychosocial Assessment  Patient Complaints Substance abuse  Eye Contact Fair  Facial Expression Anxious  Affect Anxious  Speech Logical/coherent  Interaction Assertive  Motor Activity Other (Comment) (wnl)  Appearance/Hygiene Unremarkable  Behavior Characteristics Cooperative  Mood Anxious;Depressed  Thought Process  Coherency WDL  Content WDL  Delusions WDL  Perception Hallucinations  Hallucination Auditory;Visual  Judgment Impaired  Confusion None  Danger to Self  Current suicidal ideation? Denies  Danger to Others  Danger to Others None reported or observed

## 2023-04-06 NOTE — Plan of Care (Signed)

## 2023-04-06 NOTE — [facility] Group Notes (Signed)
Adult Psychoeducational Group Note  Date:  04/06/2023 Time:  1:58 PM  Group Topic/Focus:  Crisis Planning:   The purpose of this group is to help patients create a crisis plan for use upon discharge or in the future, as needed.  Participation Level:  Active  Participation Quality:  Attentive  Affect:  Appropriate  Cognitive:  Appropriate  Insight: Appropriate  Engagement in Group:  Engaged  Modes of Intervention:  Discussion  Additional Comments:  Patient attended the [facility] group.  Jearl Klinefelter 04/06/2023, 1:58 PM

## 2023-04-06 NOTE — Progress Notes (Addendum)
[facility] MD Progress Note  04/06/2023 2:35 PM Cody Young.  MRN:  [id number]  Subjective:    Patient is a 32 year old male with a reported psychiatric history of bipolar disorder, and multiple substance use disorders, who was admitted to the psychiatric unit for evaluation of worsening depression, and also to manage substance use detoxification including alcohol, opiates, benzodiazepines, and stimulants.  Yesterday the psychiatry team made the following recommendations: -Hold Cymbalta due to elevated LFTs -Hold Depakote due to elevated LFTs -Patient received Abilify LAI within the past 3 to 4 weeks.  We will need to confirm date of last dose, formulation and dose, and decide whether to resume this or not -Discontinue oral Abilify that was restarted on admission -Restart Remeron 15 mg nightly for insomnia -Restart prazosin at 2 mg nightly for nightmares -Hold BuSpar for now -Start Ativan taper plus as needed Ativan for alcohol and benzodiazepine withdrawal -Start clonidine taper and symptomatic treatment for any opioid withdrawal.  Also starting clonidine as needed for breakthrough opioid withdrawal symptoms Repeat LFT, get a valproic acid level, order fasting lipid and A1c, and TSH Start clonidine PRN for elevated BP with parameters   On my assessment today, the patient reports that his mood is still down depressed and sad.  Reports that anxiety level still is elevated.  He reports feeling very sedated after restarting some of his medications last night, and also reports moderate withdrawal symptoms from alcohol and opioids.  Vital signs are within normal limits.  We discussed that he is currently receiving clonidine for opiate withdrawal on Ativan for alcohol and benzodiazepine withdrawal, and these can cause the patient to feel sedated, in addition we restarted the Remeron at half dose and also restarted prazosin at bedtime for nightmares.  We discussed to discontinue the Remeron and  prazosin, at least for now, with the patient also being on clonidine and Ativan.  Patient is agreeable to this.  He already has Abilify LAI as mood stabilizer administered within the last 4 weeks per patient.  Otherwise he reports that his sleep is better.  Reports low appetite.  Reports difficulty with concentration.  He reports that suicidal thoughts are less frequent and less intense.  Reports suicidal thoughts are passive without any intent or plan.  Denies any HI.  Reports auditory hallucinations are less intense, less loud, denies any command auditory hallucinations.  Denies any visual hallucinations or paranoia which is an improvement.  Principal Problem: Bipolar 1 disorder (HCC) Diagnosis: Principal Problem:   Bipolar 1 disorder (HCC) Active Problems:   Stimulant use disorder   Alcohol use disorder   Opioid use disorder   Tobacco use disorder   GAD (generalized anxiety disorder)  Total Time spent with patient: 25 minutes  Past Psychiatric History:  Bipolar disorder.  Multiple hospitalizations in the past, most recently in June 2024.  Reports multiple psychiatric medication trials, but does not recall them.  Reports the combination of Abilify, Remeron, Cymbalta, prazosin, BuSpar is mostly helpful for him.  It is unclear if the patient was also prescribed Depakote leading up to this admission, we will order a Depakote level. Patient reports a history of 2 suicide attempts in the past.     Past Medical History:  Past Medical History:  Diagnosis Date   Bipolar 1 disorder (HCC)     Past Surgical History:  Procedure Laterality Date   BRAIN SURGERY     Family History: History reviewed. No pertinent family history. Family Psychiatric  History: See H&P Social  History:  Social History   Substance and Sexual Activity  Alcohol Use Yes   Alcohol/week: 12.0 standard drinks of alcohol   Types: 12 Cans of beer per week   Comment: Currently drinks mad dogs     Social History    Substance and Sexual Activity  Drug Use Yes   Types: Marijuana, Cocaine, "Crack" cocaine   Comment: deneis current cocaine use    Social History   Socioeconomic History   Marital status: Single    Spouse name: Not on file   Number of children: Not on file   Years of education: Not on file   Highest education level: Not on file  Occupational History   Not on file  Tobacco Use   Smoking status: Every Day    Current packs/day: 1.00    Average packs/day: 1 pack/day for 20.0 years (20.0 ttl pk-yrs)    Types: Cigarettes   Smokeless tobacco: Never  Substance and Sexual Activity   Alcohol use: Yes    Alcohol/week: 12.0 standard drinks of alcohol    Types: 12 Cans of beer per week    Comment: Currently drinks mad dogs   Drug use: Yes    Types: Marijuana, Cocaine, "Crack" cocaine    Comment: deneis current cocaine use   Sexual activity: Yes    Birth control/protection: Condom  Other Topics Concern   Not on file  Social History Narrative   Not on file   Social Determinants of Health   Financial Resource Strain: Not on file  Food Insecurity: No Food Insecurity (04/05/2023)   Hunger Vital Sign    Worried About Running Out of Food in the Last Year: Never true    Ran Out of Food in the Last Year: Never true  Transportation Needs: No Transportation Needs (04/05/2023)   PRAPARE - Administrator, Civil Service (Medical): No    Lack of Transportation (Non-Medical): No  Physical Activity: Not on file  Stress: Not on file  Social Connections: Not on file   Additional Social History:                          Current Medications: Current Facility-Administered Medications  Medication Dose Route Frequency Provider Last Rate Last Admin   acetaminophen (TYLENOL) tablet 650 mg  650 mg Oral Q6H PRN Jearld Lesch, NP   650 mg at 04/05/23 1428   alum & mag hydroxide-simeth (MAALOX/MYLANTA) 200-200-20 MG/5ML suspension 30 mL  30 mL Oral Q4H PRN Jearld Lesch,  NP       cloNIDine (CATAPRES) tablet 0.1 mg  0.1 mg Oral Q4H PRN Kensey Luepke, Harrold Donath, MD       cloNIDine (CATAPRES) tablet 0.1 mg  0.1 mg Oral Q4H PRN Tyris Eliot, Harrold Donath, MD       cloNIDine (CATAPRES) tablet 0.1 mg  0.1 mg Oral QID Mayanna Garlitz, Harrold Donath, MD   0.1 mg at 04/06/23 1251   Followed by   Melene Muller ON 04/08/2023] cloNIDine (CATAPRES) tablet 0.1 mg  0.1 mg Oral BH-qamhs Badr Piedra, MD       Followed by   Melene Muller ON 04/10/2023] cloNIDine (CATAPRES) tablet 0.1 mg  0.1 mg Oral QAC breakfast Delisia Mcquiston, Harrold Donath, MD       dicyclomine (BENTYL) tablet 20 mg  20 mg Oral Q6H PRN Ford Peddie, MD       diphenhydrAMINE (BENADRYL) capsule 50 mg  50 mg Oral TID PRN Jearld Lesch, NP  Or   diphenhydrAMINE (BENADRYL) injection 50 mg  50 mg Intramuscular TID PRN Dixon, Elray Buba, NP       haloperidol (HALDOL) tablet 5 mg  5 mg Oral TID PRN Jearld Lesch, NP       Or   haloperidol lactate (HALDOL) injection 5 mg  5 mg Intramuscular TID PRN Jearld Lesch, NP       hydrOXYzine (ATARAX) tablet 25 mg  25 mg Oral TID PRN Jearld Lesch, NP       influenza vac split trivalent PF (FLULAVAL) injection 0.5 mL  0.5 mL Intramuscular Tomorrow-1000 Odysseus Cada, Harrold Donath, MD       loperamide (IMODIUM) capsule 2-4 mg  2-4 mg Oral PRN Lelan Cush, Harrold Donath, MD       LORazepam (ATIVAN) tablet 2 mg  2 mg Oral TID PRN Jearld Lesch, NP       Or   LORazepam (ATIVAN) injection 2 mg  2 mg Intramuscular TID PRN Jearld Lesch, NP       LORazepam (ATIVAN) tablet 1-4 mg  1-4 mg Oral Q1H PRN Jearld Lesch, NP       Or   LORazepam (ATIVAN) tablet 1 mg  1 mg Oral Q1H PRN Jearld Lesch, NP       LORazepam (ATIVAN) tablet 1 mg  1 mg Oral QID Julieta Rogalski, Harrold Donath, MD   1 mg at 04/06/23 1251   Followed by   Melene Muller ON 04/07/2023] LORazepam (ATIVAN) tablet 1 mg  1 mg Oral TID Phineas Inches, MD       Followed by   Melene Muller ON 04/08/2023] LORazepam (ATIVAN) tablet 1 mg  1 mg Oral BID Kristl Morioka, Harrold Donath,  MD       Followed by   Melene Muller ON 04/09/2023] LORazepam (ATIVAN) tablet 1 mg  1 mg Oral Daily Simmone Cape, Harrold Donath, MD       magnesium hydroxide (MILK OF MAGNESIA) suspension 30 mL  30 mL Oral Daily PRN Durwin Nora, Elray Buba, NP       methocarbamol (ROBAXIN) tablet 500 mg  500 mg Oral Q8H PRN Phineas Inches, MD   500 mg at 04/06/23 2440   multivitamin with minerals tablet 1 tablet  1 tablet Oral Daily Jearld Lesch, NP   1 tablet at 04/06/23 0951   naproxen (NAPROSYN) tablet 500 mg  500 mg Oral BID PRN Phineas Inches, MD   500 mg at 04/06/23 1027   nicotine (NICODERM CQ - dosed in mg/24 hours) patch 21 mg  21 mg Transdermal Daily Nahara Dona, Harrold Donath, MD   21 mg at 04/06/23 0950   nicotine polacrilex (NICORETTE) gum 2 mg  2 mg Oral PRN Jearld Lesch, NP       ondansetron (ZOFRAN) tablet 4 mg  4 mg Oral Q8H PRN Jearld Lesch, NP       pneumococcal 20-valent conjugate vaccine (PREVNAR 20) injection 0.5 mL  0.5 mL Intramuscular Tomorrow-1000 Ezzie Senat, MD       thiamine (Vitamin B-1) tablet 100 mg  100 mg Oral Daily Dixon, Rashaun M, NP   100 mg at 04/06/23 2536   traZODone (DESYREL) tablet 50 mg  50 mg Oral QHS PRN Horton Ellithorpe, Harrold Donath, MD        Lab Results:  Results for orders placed or performed during the hospital encounter of 04/05/23 (from the past 48 hour(s))  Hepatic function panel     Status: Abnormal   Collection Time: 04/06/23  6:38 AM  Result Value Ref Range  Total Protein 6.7 6.5 - 8.1 g/dL   Albumin 4.0 3.5 - 5.0 g/dL   AST 22 15 - 41 U/L   ALT 59 (H) 0 - 44 U/L   Alkaline Phosphatase 44 38 - 126 U/L   Total Bilirubin 0.8 0.3 - 1.2 mg/dL   Bilirubin, Direct 0.1 0.0 - 0.2 mg/dL   Indirect Bilirubin 0.7 0.3 - 0.9 mg/dL    Comment: Performed at [facility], [address], [place], [place] [address]  Lipid panel     Status: Abnormal   Collection Time: 04/06/23  6:38 AM  Result Value Ref Range   Cholesterol 166 0 - 200 mg/dL   Triglycerides  96 <604 mg/dL   HDL 34 (L) >54 mg/dL   Total CHOL/HDL Ratio 4.9 RATIO   VLDL 19 0 - 40 mg/dL   LDL Cholesterol 098 (H) 0 - 99 mg/dL    Comment:        Total Cholesterol/HDL:CHD Risk Coronary Heart Disease Risk Table                     Men   Women  1/2 Average Risk   3.4   3.3  Average Risk       5.0   4.4  2 X Average Risk   9.6   7.1  3 X Average Risk  23.4   11.0        Use the calculated Patient Ratio above and the CHD Risk Table to determine the patient's CHD Risk.        ATP III CLASSIFICATION (LDL):  <100     mg/dL   Optimal  119-147  mg/dL   Near or Above                    Optimal  130-159  mg/dL   Borderline  829-562  mg/dL   High  >130     mg/dL   Very High Performed at [facility], [address] [place], [place] [address]   Hemoglobin A1c     Status: None   Collection Time: 04/06/23  6:38 AM  Result Value Ref Range   Hgb A1c MFr Bld 5.5 4.8 - 5.6 %    Comment: (NOTE) Pre diabetes:          5.7%-6.4%  Diabetes:              >6.4%  Glycemic control for   <7.0% adults with diabetes    Mean Plasma Glucose 111.15 mg/dL    Comment: Performed at [facility], [address], [place], [place] [address]  TSH     Status: None   Collection Time: 04/06/23  6:38 AM  Result Value Ref Range   TSH 0.971 0.350 - 4.500 uIU/mL    Comment: Performed by a 3rd Generation assay with a functional sensitivity of <=0.01 uIU/mL. Performed at [facility], [address] [place], [place] [address]   Valproic acid level     Status: Abnormal   Collection Time: 04/06/23  6:38 AM  Result Value Ref Range   Valproic Acid Lvl <10 (L) 50.0 - 100.0 ug/mL    Comment: RESULT CONFIRMED BY MANUAL DILUTION Performed at [facility], [address] [place], [place] [address]     Blood Alcohol level:  Lab Results  Component Value Date   [facility] <10 04/04/2023   ETH <10 12/19/2022    Metabolic Disorder  Labs: Lab Results   Component Value Date   HGBA1C 5.5 04/06/2023   MPG 111.15 04/06/2023   MPG 103 09/02/2022   No results found for: "PROLACTIN" Lab Results  Component Value Date   CHOL 166 04/06/2023   TRIG 96 04/06/2023   HDL 34 (L) 04/06/2023   CHOLHDL 4.9 04/06/2023   VLDL 19 04/06/2023   LDLCALC 113 (H) 04/06/2023   LDLCALC 132 (H) 09/02/2022    Physical Findings: AIMS:  , ,  ,  ,    CIWA:  CIWA-Ar Total: 4 COWS:       Psychiatric Specialty Exam:  Presentation  General Appearance:  Disheveled (odorous)  Eye Contact: Fair  Speech: Normal Rate  Speech Volume: Decreased  Handedness: Right   Mood and Affect  Mood: Anxious; Depressed  Affect: Depressed   Thought Process  Thought Processes: Linear  Descriptions of Associations:Intact  Orientation:Full (Time, Place and Person)  Thought Content:Logical  History of Schizophrenia/Schizoaffective disorder:No  Duration of Psychotic Symptoms:Greater than six months  Hallucinations:Hallucinations: Visual  Ideas of Reference:Paranoia  Suicidal Thoughts:Suicidal Thoughts: Yes, Passive SI Passive Intent and/or Plan: Without Intent; Without Plan  Homicidal Thoughts:Homicidal Thoughts: No   Sensorium  Memory: Immediate Fair; Recent Fair; Remote Good  Judgment: Impaired  Insight: Lacking   Executive Functions  Concentration: Poor  Attention Span: Poor  Recall: Good  Fund of Knowledge: Good  Language: Good   Psychomotor Activity  Psychomotor Activity: Psychomotor Activity: Normal   Assets  Assets: Desire for Improvement; Communication Skills; Social Support   Sleep  Sleep: Sleep: Poor    Physical Exam: Physical Exam Vitals reviewed.  Constitutional:      General: He is not in acute distress.    Appearance: He is not toxic-appearing.  Pulmonary:     [place]: Pulmonary effort is normal. No respiratory distress.  Neurological:     Mental Status: He is alert.     Motor: No  weakness.     Gait: Gait normal.    Review of Systems  Constitutional:  Negative for chills and fever.  Cardiovascular:  Negative for chest pain and palpitations.  Neurological:  Negative for dizziness, tingling, tremors and headaches.  Psychiatric/Behavioral:  Positive for depression, hallucinations and substance abuse. Negative for memory loss and suicidal ideas. The patient is nervous/anxious. The patient does not have insomnia.   All other systems reviewed and are negative.  Blood pressure 126/89, pulse 69, temperature 97.7 F (36.5 C), temperature source Oral, resp. rate 18, height 6\' 2"  (1.88 m), weight 123.6 kg, SpO2 98%. Body mass index is 34.97 kg/m.   Treatment Plan Summary:  Daily contact with patient to assess and evaluate symptoms and progress in treatment and Medication management   ASSESSMENT:   Diagnoses / Active Problems: Bipolar disorder, current episode is depressed GAD Stimulant use disorder Alcohol use disorder Sedative-hypnotic use disorder Opioid use disorder Tobacco use disorder     PLAN: Safety and Monitoring:             --  Voluntary admission to inpatient psychiatric unit for safety, stabilization and treatment             -- Daily contact with patient to assess and evaluate symptoms and progress in treatment             -- Patient's case to be discussed in multi-disciplinary team meeting             -- Observation Level : q15 minute checks             --  Vital signs:  q12 hours             -- Precautions: suicide, elopement, and assault   2. Psychiatric Diagnoses and Treatment:               -Hold Cymbalta due to elevated LFTs -Hold Depakote due to elevated LFTs (pt not taking this PTA but restarted on admission by admitting provider) -Patient received Abilify LAI within the past 3 to 4 weeks.  We will need to confirm date of last dose, formulation and dose, and decide whether to resume this or not -Discontinue oral Abilify that was restarted  on admission -Dc Remeron 15 mg nightly for insomnia -Dc prazosin at 2 mg nightly for nightmares -Hold BuSpar for now   -Continue Ativan taper plus as needed Ativan for alcohol and benzodiazepine withdrawal -Continue clonidine taper and symptomatic treatment for any opioid withdrawal.  Also starting clonidine as needed for breakthrough opioid withdrawal symptoms     Continue clonidine PRN for elevated BP with parameters      --  The risks/benefits/side-effects/alternatives to this medication were discussed in detail with the patient and time was given for questions. The patient consents to medication trial.                -- Metabolic profile and EKG monitoring obtained while on an atypical antipsychotic (BMI: Lipid Panel: HbgA1c: QTc:)              -- Encouraged patient to participate in unit milieu and in scheduled group therapies              -- Short Term Goals: Ability to identify changes in lifestyle to reduce recurrence of condition will improve, Ability to verbalize feelings will improve, Ability to disclose and discuss suicidal ideas, Ability to demonstrate self-control will improve, Ability to identify and develop effective coping behaviors will improve, Ability to maintain clinical measurements within normal limits will improve, Compliance with prescribed medications will improve, and Ability to identify triggers associated with substance abuse/mental health issues will improve             -- Long Term Goals: Improvement in symptoms so as ready for discharge                3. Medical Issues Being Addressed:              Tobacco Use Disorder             -- Nicotine patch 21mg /24 hours ordered plus nicotine gum PRN              -- Smoking cessation encouraged   4. Discharge Planning:              -- Social work and case management to assist with discharge planning and identification of hospital follow-up needs prior to discharge             -- Estimated LOS: 5-7 days              -- Discharge Concerns: Need to establish a safety plan; Medication compliance and effectiveness             -- Discharge Goals: Return home with outpatient referrals for mental health follow-up including medication management/psychotherapy    Cristy Hilts, MD 04/06/2023, 2:35 PM  Total Time Spent in Direct Patient Care:  I personally spent 35 minutes on the unit in direct patient care. The direct patient care time included face-to-face time with the  patient, reviewing the patient's chart, communicating with other professionals, and coordinating care. Greater than 50% of this time was spent in counseling or coordinating care with the patient regarding goals of hospitalization, psycho-education, and discharge planning needs.   Phineas Inches, MD Psychiatrist

## 2023-04-06 NOTE — Group Note (Signed)
LCSW Group Therapy Note   Group Date: 04/06/2023 Start Time: 1100 End Time: 1200  LCSW Group Therapy Note               Type of Therapy and Topic:  Group Therapy: Establishing Boundaries  Participation Level:  Did Not Attend  In this group, patients learned how to define boundaries, discussed the different types or boundaries with examples.  They identified times that boundaries had been violated and how they reacted.  They analyzed how their reaction was possibly beneficial and how it was possibly unhelpful.  The group discussed how to set boundaries, respect others boundaries and communicate their boundaries. The group utilized a role play scenarios (working with a partner) and discussed how each person in the scenario could have reacted differently and what boundaries they need to implement to improve their life. Patients also discussed consequences to overstepping boundaries and lack of boundaries. Patients discussed how to establish boundaries with clear consequences. Patients will explore discussion questions that address media influence and why it is hard to set boundaries.   Therapeutic Goals: Patients will define boundaries and explore (physical, personal space and language boundaries). Patients will remember their last incident where their boundaries were violated and how they behaved. Patients will practice empathy and understanding of other's boundaries and learn from others in group. Patients will explore how they may have crossed another person's boundaries in the past.  Patients will learn healthy ways to set and communicate boundaries. Patients will actively engage in group activity utilizing role play and critical thinking skills.  Summary of Patient Progress: Did not attend group.  Therapeutic Modalities:   Cognitive Behavioral Therapy  Izell Camarillo, LCSW  04/06/2023 1:36 PM
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
## 2023-04-06 NOTE — Group Note (Signed)
Recreation Therapy Group Note   Group Topic:Animal Assisted Therapy   Group Date: 04/06/2023 Start Time: 0954 End Time: 1030 Facilitators: Jaylanie Boschee-McCall, LRT,CTRS Location: [address]   Animal-Assisted Activity (AAA) Program Checklist/Progress Notes Patient Eligibility Criteria Checklist & Daily Group note for Rec Tx Intervention  AAA/T Program Assumption of Risk Form signed by Patient/ or Parent Legal Guardian Yes  Patient understands his/her participation is voluntary Yes  Education: Charity fundraiser, Appropriate Animal Interaction   Education Outcome: Acknowledges education.    Affect/Mood: N/A   Participation Level: Did not attend    Clinical Observations/Individualized Feedback:     Plan: Continue to engage patient in RT group sessions 2-3x/week.   Deandria Klute-McCall, LRT,CTRS 04/06/2023 12:53 PM

## 2023-04-06 NOTE — [facility] Group Notes (Signed)
[facility] Group Notes:  (Nursing/MHT/Case Management/Adjunct)  Date:  04/06/2023  Time:  9:44 PM  Type of Therapy:   Wrap-up group  Participation Level:  Did Not Attend  Participation Quality:    Affect:    Cognitive:    Insight:    Engagement in Group:    Modes of Intervention:    Summary of Progress/Problems: Pt refused to attend group.  Noah Delaine 04/06/2023, 9:44 PM

## 2023-04-07 ENCOUNTER — Encounter (HOSPITAL_COMMUNITY): Payer: Self-pay

## 2023-04-07 DIAGNOSIS — F319 Bipolar disorder, unspecified: Secondary | ICD-10-CM | POA: Diagnosis not present

## 2023-04-07 MED ORDER — PANTOPRAZOLE SODIUM 40 MG PO TBEC
40.0000 mg | DELAYED_RELEASE_TABLET | Freq: Every day | ORAL | Status: DC
  Administered 2023-04-07 – 2023-04-13 (×7): 40 mg via ORAL
  Filled 2023-04-07 (×10): qty 1

## 2023-04-07 MED ORDER — BUSPIRONE HCL 10 MG PO TABS
10.0000 mg | ORAL_TABLET | Freq: Two times a day (BID) | ORAL | Status: DC
  Filled 2023-04-07 (×2): qty 1

## 2023-04-07 MED ORDER — BUSPIRONE HCL 10 MG PO TABS
10.0000 mg | ORAL_TABLET | Freq: Two times a day (BID) | ORAL | Status: DC
  Administered 2023-04-07 – 2023-04-12 (×10): 10 mg via ORAL
  Filled 2023-04-07 (×14): qty 1

## 2023-04-07 MED ORDER — BENZTROPINE MESYLATE 0.5 MG PO TABS
0.5000 mg | ORAL_TABLET | Freq: Every day | ORAL | Status: DC
  Filled 2023-04-07: qty 1

## 2023-04-07 NOTE — Progress Notes (Signed)
Patient ID: Cody Young., male   DOB: [birth year], 32 y.o.   MRN: [id number]  Spoke with patient this morning regarding going to treatment for substance use when discharged from [facility]. Pt stated "I do not want to be away from my mom any longer than I already am because I am here. I don't want to go to treatment but thanks for offering. I just want to go back home and be with her." Pt then inquired about when he can leave and CSW advised to follow up with MD regarding discharge but noted to pt that he has only been here for 2 days.

## 2023-04-07 NOTE — Plan of Care (Signed)
  Problem: Education: Goal: Knowledge of [place] [facility] Education information/materials will improve Outcome: Progressing Goal: Emotional status will improve Outcome: Progressing Goal: Mental status will improve Outcome: Progressing   Problem: Activity: Goal: Interest or engagement in activities will improve Outcome: Progressing Goal: Sleeping patterns will improve Outcome: Progressing   Problem: Coping: Goal: Ability to verbalize frustrations and anger appropriately will improve Outcome: Progressing   Problem: Safety: Goal: Periods of time without injury will increase Outcome: Progressing

## 2023-04-07 NOTE — Group Note (Signed)
Recreation Therapy Group Note   Group Topic:Problem Solving  Group Date: 04/07/2023 Start Time: 0935 End Time: 1015 Facilitators: Benigno Check-McCall, LRT,CTRS Location: [address] Dayroom   Group Topic: Communication, Team Building, Problem Solving  Goal Area(s) Addresses:  Patient will effectively work with peer towards shared goal.  Patient will identify skills used to make activity successful.  Patient will share challenges and verbalize solution-driven approaches used. Patient will identify how skills used during activity can be used to reach post d/c goals.   Intervention: STEM Activity   Group Description: [employer]. Patients were provided the following materials: 4 drinking straws, 5 rubber bands, 5 paper clips, 2 index cards, 2 drinking cups, and 2 toilet paper rolls. Using the provided materials patients were asked to build a launching mechanism to launch a ping pong ball across the room, approximately 10 feet. Patients were divided into teams of 3-5. Instructions required all materials be incorporated into the device, functionality of items left to the peer group's discretion.  Education: Pharmacist, community, Scientist, physiological, Air cabin crew, Building control surveyor.   Education Outcome: Acknowledges education/In group clarification offered/Needs additional education.    Affect/Mood: Appropriate   Participation Level: Minimal   Participation Quality: Independent   Behavior: Appropriate   Speech/Thought Process: N/A   Insight: N/A   Judgement: N/A   Modes of Intervention: STEM Activity   Patient Response to Interventions:  N/A   Education Outcome:  In group clarification offered    Clinical Observations/Individualized Feedback: Pt was in group long enough to get the instructions and start with the construction of the launcher, before being called out of group to meet with other staff and didn't return.     Plan: Continue to engage patient in RT group sessions  2-3x/week.   Cody Young, LRT,CTRS 04/07/2023 12:18 PM

## 2023-04-07 NOTE — Progress Notes (Signed)
Patient rated his depression level 6/10 and his anxiety level 9/10 with 10 being the highest and 0 none. Medication and group compliant. Minimal interaction observed with peers. CIWA and COWS assessment continues on shift as ordered. Pt denied SI/HI and AVH. Appetite good on shift . Safety maintained.  04/07/23 0825  Psych Admission Type (Psych Patients Only)  Admission Status Voluntary  Psychosocial Assessment  Patient Complaints Substance abuse;Anxiety;Depression  Eye Contact Fair  Facial Expression Anxious  Affect Anxious;Depressed  Speech Logical/coherent  Interaction Assertive  Motor Activity Slow  Appearance/Hygiene Unremarkable  Behavior Characteristics Appropriate to situation  Mood Depressed;Anxious  Thought Process  Coherency WDL  Content WDL  Delusions None reported or observed  Perception WDL  Hallucination None reported or observed  Judgment Impaired  Confusion WDL  Danger to Self  Current suicidal ideation? Denies  Danger to Others  Danger to Others None reported or observed

## 2023-04-07 NOTE — Plan of Care (Signed)
  Problem: Education: Goal: Emotional status will improve Outcome: Progressing Goal: Mental status will improve Outcome: Progressing   Problem: Activity: Goal: Interest or engagement in activities will improve Outcome: Progressing Goal: Sleeping patterns will improve Outcome: Progressing

## 2023-04-07 NOTE — Progress Notes (Cosign Needed Addendum)
[facility] MD Progress Note  04/07/2023 5:12 PM Colin Broach.  MRN:  [id number]  HPI:   Patient is a 32 year old male with a reported psychiatric history of bipolar disorder, and multiple substance use disorders, who was admitted to the psychiatric unit for evaluation of worsening depression, and also to manage substance use detoxification including alcohol, opiates, benzodiazepines, and stimulants.  24 hr chart review: Sleep Hours last night: Slept all night as per nursing Nursing Concerns: Isolating and not attending group sessions Behavioral episodes in the past 24 EXB:MWUX reported/noted Medication Compliance: Compliant  Vital Signs in the past 24 hrs: WNL but HR slightly low PRN Medications in the past 24 hrs: Robaxin, Naproxen, Trazodone  Patient assessment note: During encounter today, patient reports that his mood is improving, he however, reports continuing to be significantly anxious, rates anxiety as 10, 10 being worse.  It is possible that patient is over reporting anxiety, as he is calm as per objective assessments.  He denies SI/HI/AVH, denies delusional thinking, denies paranoia.  He reports a good sleep quality last night, reports a good appetite, even though he is reporting some nausea due to epigastric pain that he has, and reports a history of GERD for which she typically takes Pepto-Bismol at home.  Patient agreeable to Pepcid being ordered to him here at the hospital for GERD.  He reports that his energy level is low, concentration is low.  Educated on the need to engage in unit group sessions, and verbalizes understanding.   The patient is reporting some jaw tightness that is intermittent, that was not happening at time of assessment. He is not on any  meds that could be causing this symptom.  He reports that he is tolerating the Ativan and clonidine taper well.  We are continuing this taper, as well as adding BuSpar 10 mg twice daily for anxiety, and Protonix 40 mg for  GERD.  He denies any concerns with his bowel movements, states that the last one was yesterday. Continuing all other medications as listed below, and continues hospitalization remains necessary at this time as we continue the taper and adjust other meds for stabilization of pt's mental health. We will continue to reassess and revisit discharge planning on a daily basis. No TD/EPS type symptoms found on assessment, even though pt reports some tightness of the jaw that comes and goes. AIMS: 0.   Principal Problem: Bipolar 1 disorder (HCC) Diagnosis: Principal Problem:   Bipolar 1 disorder (HCC) Active Problems:   Stimulant use disorder   Alcohol use disorder   Opioid use disorder   Tobacco use disorder   GAD (generalized anxiety disorder)  Total Time spent with patient: 25 minutes  Past Psychiatric History:  Bipolar disorder.  Multiple hospitalizations in the past, most recently in June 2024.  Reports multiple psychiatric medication trials, but does not recall them.  Reports the combination of Abilify, Remeron, Cymbalta, prazosin, BuSpar is mostly helpful for him.  It is unclear if the patient was also prescribed Depakote leading up to this admission, we will order a Depakote level. Patient reports a history of 2 suicide attempts in the past.     Past Medical History:  Past Medical History:  Diagnosis Date   Bipolar 1 disorder (HCC)     Past Surgical History:  Procedure Laterality Date   BRAIN SURGERY     Family History: History reviewed. No pertinent family history. Family Psychiatric  History: See H&P Social History:  Social History   Substance  and Sexual Activity  Alcohol Use Yes   Alcohol/week: 12.0 standard drinks of alcohol   Types: 12 Cans of beer per week   Comment: Currently drinks mad dogs     Social History   Substance and Sexual Activity  Drug Use Yes   Types: Marijuana, Cocaine, "Crack" cocaine   Comment: deneis current cocaine use    Social History    Socioeconomic History   Marital status: Single    Spouse name: Not on file   Number of children: Not on file   Years of education: Not on file   Highest education level: Not on file  Occupational History   Not on file  Tobacco Use   Smoking status: Every Day    Current packs/day: 1.00    Average packs/day: 1 pack/day for 20.0 years (20.0 ttl pk-yrs)    Types: Cigarettes   Smokeless tobacco: Never  Substance and Sexual Activity   Alcohol use: Yes    Alcohol/week: 12.0 standard drinks of alcohol    Types: 12 Cans of beer per week    Comment: Currently drinks mad dogs   Drug use: Yes    Types: Marijuana, Cocaine, "Crack" cocaine    Comment: deneis current cocaine use   Sexual activity: Yes    Birth control/protection: Condom  Other Topics Concern   Not on file  Social History Narrative   Not on file   Social Determinants of Health   Financial Resource Strain: Not on file  Food Insecurity: No Food Insecurity (04/05/2023)   Hunger Vital Sign    Worried About Running Out of Food in the Last Year: Never true    Ran Out of Food in the Last Year: Never true  Transportation Needs: No Transportation Needs (04/05/2023)   PRAPARE - Administrator, Civil Service (Medical): No    Lack of Transportation (Non-Medical): No  Physical Activity: Not on file  Stress: Not on file  Social Connections: Not on file   Additional Social History:                          Current Medications: Current Facility-Administered Medications  Medication Dose Route Frequency Provider Last Rate Last Admin   acetaminophen (TYLENOL) tablet 650 mg  650 mg Oral Q6H PRN Jearld Lesch, NP   650 mg at 04/06/23 1809   alum & mag hydroxide-simeth (MAALOX/MYLANTA) 200-200-20 MG/5ML suspension 30 mL  30 mL Oral Q4H PRN Dixon, Rashaun M, NP       busPIRone (BUSPAR) tablet 10 mg  10 mg Oral BID Starleen Blue, NP       cloNIDine (CATAPRES) tablet 0.1 mg  0.1 mg Oral Q4H PRN Massengill,  Harrold Donath, MD       cloNIDine (CATAPRES) tablet 0.1 mg  0.1 mg Oral Q4H PRN Massengill, Harrold Donath, MD       cloNIDine (CATAPRES) tablet 0.1 mg  0.1 mg Oral QID Massengill, Harrold Donath, MD   0.1 mg at 04/07/23 1200   Followed by   Melene Muller ON 04/08/2023] cloNIDine (CATAPRES) tablet 0.1 mg  0.1 mg Oral BH-qamhs Massengill, Nathan, MD       Followed by   Melene Muller ON 04/10/2023] cloNIDine (CATAPRES) tablet 0.1 mg  0.1 mg Oral QAC breakfast Massengill, Harrold Donath, MD       dicyclomine (BENTYL) tablet 20 mg  20 mg Oral Q6H PRN Massengill, Nathan, MD       diphenhydrAMINE (BENADRYL) capsule 50 mg  50 mg  Oral TID PRN Jearld Lesch, NP       Or   diphenhydrAMINE (BENADRYL) injection 50 mg  50 mg Intramuscular TID PRN Durwin Nora, Rashaun M, NP       haloperidol (HALDOL) tablet 5 mg  5 mg Oral TID PRN Jearld Lesch, NP       Or   haloperidol lactate (HALDOL) injection 5 mg  5 mg Intramuscular TID PRN Jearld Lesch, NP       hydrOXYzine (ATARAX) tablet 25 mg  25 mg Oral TID PRN Jearld Lesch, NP   25 mg at 04/06/23 2129   loperamide (IMODIUM) capsule 2-4 mg  2-4 mg Oral PRN Massengill, Harrold Donath, MD       LORazepam (ATIVAN) tablet 2 mg  2 mg Oral TID PRN Jearld Lesch, NP       Or   LORazepam (ATIVAN) injection 2 mg  2 mg Intramuscular TID PRN Jearld Lesch, NP       LORazepam (ATIVAN) tablet 1-4 mg  1-4 mg Oral Q1H PRN Jearld Lesch, NP       Or   LORazepam (ATIVAN) tablet 1 mg  1 mg Oral Q1H PRN Jearld Lesch, NP       LORazepam (ATIVAN) tablet 1 mg  1 mg Oral TID Phineas Inches, MD   1 mg at 04/07/23 1200   Followed by   Melene Muller ON 04/08/2023] LORazepam (ATIVAN) tablet 1 mg  1 mg Oral BID Massengill, Harrold Donath, MD       Followed by   Melene Muller ON 04/09/2023] LORazepam (ATIVAN) tablet 1 mg  1 mg Oral Daily Massengill, Harrold Donath, MD       magnesium hydroxide (MILK OF MAGNESIA) suspension 30 mL  30 mL Oral Daily PRN Jearld Lesch, NP       methocarbamol (ROBAXIN) tablet 500 mg  500 mg Oral Q8H PRN  Phineas Inches, MD   500 mg at 04/06/23 0350   multivitamin with minerals tablet 1 tablet  1 tablet Oral Daily Jearld Lesch, NP   1 tablet at 04/07/23 0938   naproxen (NAPROSYN) tablet 500 mg  500 mg Oral BID PRN Phineas Inches, MD   500 mg at 04/07/23 1829   nicotine (NICODERM CQ - dosed in mg/24 hours) patch 21 mg  21 mg Transdermal Daily Massengill, Harrold Donath, MD   21 mg at 04/07/23 9371   nicotine polacrilex (NICORETTE) gum 2 mg  2 mg Oral PRN Jearld Lesch, NP       ondansetron (ZOFRAN) tablet 4 mg  4 mg Oral Q8H PRN Jearld Lesch, NP       pantoprazole (PROTONIX) EC tablet 40 mg  40 mg Oral Daily Massengill, Harrold Donath, MD   40 mg at 04/07/23 1444   thiamine (Vitamin B-1) tablet 100 mg  100 mg Oral Daily Lerry Liner M, NP   100 mg at 04/07/23 6967   traZODone (DESYREL) tablet 50 mg  50 mg Oral QHS PRN Phineas Inches, MD   50 mg at 04/06/23 2129    Lab Results:  Results for orders placed or performed during the hospital encounter of 04/05/23 (from the past 48 hour(s))  Hepatic function panel     Status: Abnormal   Collection Time: 04/06/23  6:38 AM  Result Value Ref Range   Total Protein 6.7 6.5 - 8.1 g/dL   Albumin 4.0 3.5 - 5.0 g/dL   AST 22 15 - 41 U/L   ALT 59 (H) 0 -  44 U/L   Alkaline Phosphatase 44 38 - 126 U/L   Total Bilirubin 0.8 0.3 - 1.2 mg/dL   Bilirubin, Direct 0.1 0.0 - 0.2 mg/dL   Indirect Bilirubin 0.7 0.3 - 0.9 mg/dL    Comment: Performed at [facility], [address], [place], [place] [address]  Lipid panel     Status: Abnormal   Collection Time: 04/06/23  6:38 AM  Result Value Ref Range   Cholesterol 166 0 - 200 mg/dL   Triglycerides 96 <604 mg/dL   HDL 34 (L) >54 mg/dL   Total CHOL/HDL Ratio 4.9 RATIO   VLDL 19 0 - 40 mg/dL   LDL Cholesterol 098 (H) 0 - 99 mg/dL    Comment:        Total Cholesterol/HDL:CHD Risk Coronary Heart Disease Risk Table                     Men   Women  1/2 Average Risk   3.4   3.3  Average  Risk       5.0   4.4  2 X Average Risk   9.6   7.1  3 X Average Risk  23.4   11.0        Use the calculated Patient Ratio above and the CHD Risk Table to determine the patient's CHD Risk.        ATP III CLASSIFICATION (LDL):  <100     mg/dL   Optimal  119-147  mg/dL   Near or Above                    Optimal  130-159  mg/dL   Borderline  829-562  mg/dL   High  >130     mg/dL   Very High Performed at [facility], [address] [place], [place] [address]   Hemoglobin A1c     Status: None   Collection Time: 04/06/23  6:38 AM  Result Value Ref Range   Hgb A1c MFr Bld 5.5 4.8 - 5.6 %    Comment: (NOTE) Pre diabetes:          5.7%-6.4%  Diabetes:              >6.4%  Glycemic control for   <7.0% adults with diabetes    Mean Plasma Glucose 111.15 mg/dL    Comment: Performed at [facility], [address] [place], [place] [address]  TSH     Status: None   Collection Time: 04/06/23  6:38 AM  Result Value Ref Range   TSH 0.971 0.350 - 4.500 uIU/mL    Comment: Performed by a 3rd Generation assay with a functional sensitivity of <=0.01 uIU/mL. Performed at [facility], [address] [place], [place] [address]   Valproic acid level     Status: Abnormal   Collection Time: 04/06/23  6:38 AM  Result Value Ref Range   Valproic Acid Lvl <10 (L) 50.0 - 100.0 ug/mL    Comment: RESULT CONFIRMED BY MANUAL DILUTION Performed at [facility], [address], [place], [place] [address]     Blood Alcohol level:  Lab Results  Component Value Date   [facility] <10 04/04/2023   ETH <10 12/19/2022    Metabolic Disorder Labs: Lab Results  Component Value Date   HGBA1C 5.5 04/06/2023   MPG 111.15 04/06/2023   MPG 103 09/02/2022   No results found for: "PROLACTIN" Lab  Results  Component Value Date   CHOL 166 04/06/2023   TRIG 96 04/06/2023   HDL 34 (L) 04/06/2023   CHOLHDL 4.9 04/06/2023   VLDL 19 04/06/2023   LDLCALC  113 (H) 04/06/2023   LDLCALC 132 (H) 09/02/2022    Physical Findings: AIMS:  , ,  ,  ,    CIWA:  CIWA-Ar Total: 7 COWS:  COWS Total Score: 5    Psychiatric Specialty Exam:  Presentation  General Appearance:  Appropriate for Environment; Casual; Fairly Groomed  Eye Contact: Fair  Speech: Clear and Coherent  Speech Volume: Normal  Handedness: Right   Mood and Affect  Mood: Anxious; Depressed  Affect: Congruent   Thought Process  Thought Processes: Coherent  Descriptions of Associations:Intact  Orientation:Full (Time, Place and Person)  Thought Content:Logical  History of Schizophrenia/Schizoaffective disorder:No  Duration of Psychotic Symptoms:N/A  Hallucinations:Hallucinations: None   Ideas of Reference:None  Suicidal Thoughts:Suicidal Thoughts: No   Homicidal Thoughts:Homicidal Thoughts: No    Sensorium  Memory: Recent Fair  Judgment: Fair  Insight: Fair   Art therapist  Concentration: Fair  Attention Span: Good  Recall: Fair  Fund of Knowledge: Fair  Language: Good   Psychomotor Activity  Psychomotor Activity: Psychomotor Activity: Normal    Assets  Assets: Communication Skills   Sleep  Sleep: Sleep: Good     Physical Exam: Physical Exam Vitals reviewed.  Constitutional:      General: He is not in acute distress.    Appearance: He is not toxic-appearing.  Pulmonary:     Effort: Pulmonary effort is normal. No respiratory distress.  Neurological:     Mental Status: He is alert.     Motor: No weakness.     Gait: Gait normal.    Review of Systems  Constitutional:  Negative for chills and fever.  Cardiovascular:  Negative for chest pain and palpitations.  Neurological:  Negative for dizziness, tingling, tremors and headaches.  Psychiatric/Behavioral:  Positive for depression, hallucinations and substance abuse. Negative for memory loss and suicidal ideas. The patient is nervous/anxious.  The patient does not have insomnia.   All other systems reviewed and are negative.  Blood pressure 107/71, pulse (!) 51, temperature 98.5 F (36.9 C), temperature source Oral, resp. rate 16, height 6\' 2"  (1.88 m), weight 123.6 kg, SpO2 100%. Body mass index is 34.97 kg/m.   Treatment Plan Summary:  Daily contact with patient to assess and evaluate symptoms and progress in treatment and Medication management   ASSESSMENT:   Diagnoses / Active Problems: Bipolar disorder, current episode is depressed GAD Stimulant use disorder Alcohol use disorder Sedative-hypnotic use disorder Opioid use disorder Tobacco use disorder     PLAN: Safety and Monitoring:             --  Voluntary admission to inpatient psychiatric unit for safety, stabilization and treatment             -- Daily contact with patient to assess and evaluate symptoms and progress in treatment             -- Patient's case to be discussed in multi-disciplinary team meeting             -- Observation Level : q15 minute checks             -- Vital signs:  q12 hours             -- Precautions: suicide, elopement, and assault   2. Psychiatric Diagnoses and Treatment:               -  Hold Cymbalta due to elevated LFTs -Hold Depakote due to elevated LFTs (pt not taking this PTA but restarted on admission by admitting provider) -Patient received Abilify LAI within the past 3 to 4 weeks.  We will need to confirm date of last dose, formulation and dose, and decide whether to resume this or not -Discontinue oral Abilify that was restarted on admission -Dc Remeron 15 mg nightly for insomnia -Dc prazosin at 2 mg nightly for nightmares -Hold BuSpar for now   -Continue Ativan taper plus as needed Ativan for alcohol and benzodiazepine withdrawal -Continue clonidine taper and symptomatic treatment for any opioid withdrawal.  Also starting clonidine as needed for breakthrough opioid withdrawal symptoms   -Start Protonix 40 mg daily  for GERD -Start Cogentin 0.5 mg daily for EPS prophylaxis -Start Buspar 10 BID for GAD -Continue Trazodone 50 mg nightly PRN for sleep -Continue clonidine PRN for elevated BP with parameters     --  The risks/benefits/side-effects/alternatives to this medication were discussed in detail with the patient and time was given for questions. The patient consents to medication trial.                -- Metabolic profile and EKG monitoring obtained while on an atypical antipsychotic (BMI: Lipid Panel: HbgA1c: QTc:)              -- Encouraged patient to participate in unit milieu and in scheduled group therapies              -- Short Term Goals: Ability to identify changes in lifestyle to reduce recurrence of condition will improve, Ability to verbalize feelings will improve, Ability to disclose and discuss suicidal ideas, Ability to demonstrate self-control will improve, Ability to identify and develop effective coping behaviors will improve, Ability to maintain clinical measurements within normal limits will improve, Compliance with prescribed medications will improve, and Ability to identify triggers associated with substance abuse/mental health issues will improve             -- Long Term Goals: Improvement in symptoms so as ready for discharge                3. Medical Issues Being Addressed:              Tobacco Use Disorder             -- Nicotine patch 21mg /24 hours ordered plus nicotine gum PRN              -- Smoking cessation encouraged   4. Discharge Planning:              -- Social work and case management to assist with discharge planning and identification of hospital follow-up needs prior to discharge             -- Estimated LOS: 5-7 days             -- Discharge Concerns: Need to establish a safety plan; Medication compliance and effectiveness             -- Discharge Goals: Return home with outpatient referrals for mental health follow-up including medication  management/psychotherapy    Starleen Blue, NP 04/07/2023, 5:12 PM  Total Time Spent in Direct Patient Care:  I personally spent 45 minutes on the unit in direct patient care. The direct patient care time included face-to-face time with the patient, reviewing the patient's chart, communicating with other professionals, and coordinating care. Greater than 50% of this time  was spent in counseling or coordinating care with the patient regarding goals of hospitalization, psycho-education, and discharge planning needs.  Patient ID: Pj Zehner., male   DOB: 11/08/1990, 32 y.o.   MRN: [id number]

## 2023-04-07 NOTE — [facility] Group Notes (Signed)
Spiritual care group facilitated by Chaplain Katy Denessa Cavan, [facility]  Group focused on topic of strength. Group members reflected on what thoughts and feelings emerge when they hear this topic. They then engaged in facilitated dialog around how strength is present in their lives. This dialog focused on representing what strength had been to them in their lives (images and patterns given) and what they saw as helpful in their life now (what they needed / wanted).  Activity drew on narrative framework.  Patient Progress: Did not attend.  

## 2023-04-07 NOTE — [facility] Group Notes (Signed)
Adult Psychoeducational Group Note  Date:  04/07/2023 Time:  10:33 PM  Group Topic/Focus:  Wrap-Up Group:   The focus of this group is to help patients review their daily goal of treatment and discuss progress on daily workbooks.  Participation Level:  Did Not Attend   Shelby Mattocks 04/07/2023, 10:33 PM

## 2023-04-07 NOTE — Progress Notes (Signed)
   04/07/23 2045  Psych Admission Type (Psych Patients Only)  Admission Status Voluntary  Psychosocial Assessment  Patient Complaints Substance abuse  Eye Contact Fair  Facial Expression Anxious  Affect Anxious  Speech Logical/coherent  Interaction Assertive  Motor Activity Slow  Appearance/Hygiene Unremarkable  Behavior Characteristics Appropriate to situation;Cooperative  Mood Anxious;Depressed  Aggressive Behavior  Effect No apparent injury  Thought Process  Coherency WDL  Content WDL  Delusions WDL  Perception Hallucinations  Hallucination Auditory;Visual  Judgment Impaired  Confusion WDL  Danger to Self  Current suicidal ideation? Denies  Danger to Others  Danger to Others None reported or observed

## 2023-04-07 NOTE — Progress Notes (Signed)
   04/07/23 0015  Psych Admission Type (Psych Patients Only)  Admission Status Voluntary  Psychosocial Assessment  Patient Complaints Substance abuse  Eye Contact Fair  Facial Expression Anxious  Affect Anxious  Speech Logical/coherent  Interaction Assertive  Motor Activity Slow  Appearance/Hygiene Unremarkable  Behavior Characteristics Cooperative  Mood Depressed;Anxious;Preoccupied  Aggressive Behavior  Effect No apparent injury  Thought Process  Coherency WDL  Content WDL  Delusions WDL  Perception Hallucinations  Hallucination Auditory;Visual  Judgment Impaired  Confusion WDL  Danger to Self  Current suicidal ideation? Denies  Danger to Others  Danger to Others None reported or observed

## 2023-04-07 NOTE — [facility] Group Notes (Signed)
[facility] Group Notes:  (Nursing/MHT/Case Management/Adjunct) dult Psychoeducational Group Note  Date:  04/07/2023 Time:  12:24 PM  Group Topic/Focus:  Emotional Education:   The focus of this group is to discuss what feelings/emotions are, and how they are experienced. Goals Group:   The focus of this group is to help patients establish daily goals to achieve during treatment and discuss how the patient can incorporate goal setting into their daily lives to aide in recovery. Orientation:   The focus of this group is to educate the patient on the purpose and policies of crisis stabilization and provide a format to answer questions about their admission.  The group details unit policies and expectations of patients while admitted.  Participation Level:  Active  Participation Quality:  Appropriate  Affect:  Appropriate  Cognitive:  Appropriate  Insight: Good  Engagement in Group:  Engaged  Modes of Intervention:  Discussion, Education, and Orientation  Additional Comments:    Lucilla Edin 04/07/2023, 12:24 PM

## 2023-04-07 NOTE — Plan of Care (Signed)
Problem: Education: Goal: Mental status will improve Outcome: Progressing   Problem: Activity: Goal: Interest or engagement in activities will improve Outcome: Progressing Goal: Sleeping patterns will improve Outcome: Progressing   Problem: Safety: Goal: Periods of time without injury will increase Outcome: Progressing

## 2023-04-07 NOTE — BH IP Treatment Plan (Signed)
Interdisciplinary Treatment and Diagnostic Plan Update  04/07/2023 Time of Session: 11:00 AM Colin Broach. MRN: [id number]  Principal Diagnosis: Bipolar 1 disorder (HCC)  Secondary Diagnoses: Principal Problem:   Bipolar 1 disorder (HCC) Active Problems:   Stimulant use disorder   Alcohol use disorder   Opioid use disorder   Tobacco use disorder   GAD (generalized anxiety disorder)   Current Medications:  Current Facility-Administered Medications  Medication Dose Route Frequency Provider Last Rate Last Admin   acetaminophen (TYLENOL) tablet 650 mg  650 mg Oral Q6H PRN Jearld Lesch, NP   650 mg at 04/06/23 1809   alum & mag hydroxide-simeth (MAALOX/MYLANTA) 200-200-20 MG/5ML suspension 30 mL  30 mL Oral Q4H PRN Jearld Lesch, NP       cloNIDine (CATAPRES) tablet 0.1 mg  0.1 mg Oral Q4H PRN Massengill, Harrold Donath, MD       cloNIDine (CATAPRES) tablet 0.1 mg  0.1 mg Oral Q4H PRN Massengill, Harrold Donath, MD       cloNIDine (CATAPRES) tablet 0.1 mg  0.1 mg Oral QID Massengill, Harrold Donath, MD   0.1 mg at 04/07/23 4098   Followed by   Melene Muller ON 04/08/2023] cloNIDine (CATAPRES) tablet 0.1 mg  0.1 mg Oral BH-qamhs Massengill, Harrold Donath, MD       Followed by   Melene Muller ON 04/10/2023] cloNIDine (CATAPRES) tablet 0.1 mg  0.1 mg Oral QAC breakfast Massengill, Harrold Donath, MD       dicyclomine (BENTYL) tablet 20 mg  20 mg Oral Q6H PRN Massengill, Harrold Donath, MD       diphenhydrAMINE (BENADRYL) capsule 50 mg  50 mg Oral TID PRN Jearld Lesch, NP       Or   diphenhydrAMINE (BENADRYL) injection 50 mg  50 mg Intramuscular TID PRN Dixon, Rashaun M, NP       haloperidol (HALDOL) tablet 5 mg  5 mg Oral TID PRN Jearld Lesch, NP       Or   haloperidol lactate (HALDOL) injection 5 mg  5 mg Intramuscular TID PRN Jearld Lesch, NP       hydrOXYzine (ATARAX) tablet 25 mg  25 mg Oral TID PRN Jearld Lesch, NP   25 mg at 04/06/23 2129   influenza vac split trivalent PF (FLULAVAL) injection 0.5 mL  0.5 mL  Intramuscular Tomorrow-1000 Massengill, Harrold Donath, MD       loperamide (IMODIUM) capsule 2-4 mg  2-4 mg Oral PRN Massengill, Harrold Donath, MD       LORazepam (ATIVAN) tablet 2 mg  2 mg Oral TID PRN Jearld Lesch, NP       Or   LORazepam (ATIVAN) injection 2 mg  2 mg Intramuscular TID PRN Jearld Lesch, NP       LORazepam (ATIVAN) tablet 1-4 mg  1-4 mg Oral Q1H PRN Jearld Lesch, NP       Or   LORazepam (ATIVAN) tablet 1 mg  1 mg Oral Q1H PRN Durwin Nora, Rashaun M, NP       LORazepam (ATIVAN) tablet 1 mg  1 mg Oral TID Massengill, Harrold Donath, MD   1 mg at 04/07/23 1191   Followed by   Melene Muller ON 04/08/2023] LORazepam (ATIVAN) tablet 1 mg  1 mg Oral BID Massengill, Harrold Donath, MD       Followed by   Melene Muller ON 04/09/2023] LORazepam (ATIVAN) tablet 1 mg  1 mg Oral Daily Massengill, Nathan, MD       magnesium hydroxide (MILK OF MAGNESIA) suspension 30 mL  30 mL Oral Daily PRN Jearld Lesch, NP       methocarbamol (ROBAXIN) tablet 500 mg  500 mg Oral Q8H PRN Phineas Inches, MD   500 mg at 04/06/23 8295   multivitamin with minerals tablet 1 tablet  1 tablet Oral Daily Jearld Lesch, NP   1 tablet at 04/07/23 6213   naproxen (NAPROSYN) tablet 500 mg  500 mg Oral BID PRN Phineas Inches, MD   500 mg at 04/07/23 0865   nicotine (NICODERM CQ - dosed in mg/24 hours) patch 21 mg  21 mg Transdermal Daily Massengill, Harrold Donath, MD   21 mg at 04/07/23 7846   nicotine polacrilex (NICORETTE) gum 2 mg  2 mg Oral PRN Jearld Lesch, NP       ondansetron (ZOFRAN) tablet 4 mg  4 mg Oral Q8H PRN Jearld Lesch, NP       pneumococcal 20-valent conjugate vaccine (PREVNAR 20) injection 0.5 mL  0.5 mL Intramuscular Tomorrow-1000 Massengill, Harrold Donath, MD       thiamine (Vitamin B-1) tablet 100 mg  100 mg Oral Daily Durwin Nora, Rashaun M, NP   100 mg at 04/07/23 9629   traZODone (DESYREL) tablet 50 mg  50 mg Oral QHS PRN Phineas Inches, MD   50 mg at 04/06/23 2129   PTA Medications: Medications Prior to Admission   Medication Sig Dispense Refill Last Dose   ARIPiprazole ER (ABILIFY MAINTENA) 400 MG PRSY prefilled syringe Inject 400 mg into the muscle every 30 (thirty) days.   03/18/2023   busPIRone (BUSPAR) 30 MG tablet Take 30 mg by mouth 2 (two) times daily.   04/01/2023   disulfiram (ANTABUSE) 250 MG tablet Take 250 mg by mouth daily.   04/01/2023   DULoxetine (CYMBALTA) 60 MG capsule Take 60 mg by mouth 2 (two) times daily.   04/01/2023   guanFACINE (INTUNIV) 1 MG TB24 ER tablet Take 1 mg by mouth daily.   04/01/2023   mirtazapine (REMERON) 30 MG tablet Take 30 mg by mouth at bedtime.   04/01/2023   prazosin (MINIPRESS) 2 MG capsule Take 2 mg by mouth 3 (three) times daily.   04/01/2023   valproic acid (DEPAKENE) 250 MG/5ML solution Take 1,000 mg by mouth 3 (three) times daily.   04/01/2023    Patient Stressors: Medication change or noncompliance   Substance abuse    Patient Strengths: Forensic psychologist fund of knowledge  Motivation for treatment/growth  Supportive family/friends   Treatment Modalities: Medication Management, Group therapy, Case management,  1 to 1 session with clinician, Psychoeducation, Recreational therapy.   Physician Treatment Plan for Primary Diagnosis: Bipolar 1 disorder (HCC) Long Term Goal(s): Improvement in symptoms so as ready for discharge   Short Term Goals: Ability to identify changes in lifestyle to reduce recurrence of condition will improve Ability to verbalize feelings will improve Ability to disclose and discuss suicidal ideas Ability to demonstrate self-control will improve Ability to identify and develop effective coping behaviors will improve Ability to maintain clinical measurements within normal limits will improve Compliance with prescribed medications will improve Ability to identify triggers associated with substance abuse/mental health issues will improve  Medication Management: Evaluate patient's response, side effects, and  tolerance of medication regimen.  Therapeutic Interventions: 1 to 1 sessions, Unit Group sessions and Medication administration.  Evaluation of Outcomes: Not Progressing  Physician Treatment Plan for Secondary Diagnosis: Principal Problem:   Bipolar 1 disorder (HCC) Active Problems:   Stimulant use disorder   Alcohol use  disorder   Opioid use disorder   Tobacco use disorder   GAD (generalized anxiety disorder)  Long Term Goal(s): Improvement in symptoms so as ready for discharge   Short Term Goals: Ability to identify changes in lifestyle to reduce recurrence of condition will improve Ability to verbalize feelings will improve Ability to disclose and discuss suicidal ideas Ability to demonstrate self-control will improve Ability to identify and develop effective coping behaviors will improve Ability to maintain clinical measurements within normal limits will improve Compliance with prescribed medications will improve Ability to identify triggers associated with substance abuse/mental health issues will improve     Medication Management: Evaluate patient's response, side effects, and tolerance of medication regimen.  Therapeutic Interventions: 1 to 1 sessions, Unit Group sessions and Medication administration.  Evaluation of Outcomes: Not Progressing   RN Treatment Plan for Primary Diagnosis: Bipolar 1 disorder (HCC) Long Term Goal(s): Knowledge of disease and therapeutic regimen to maintain health will improve  Short Term Goals: Ability to remain free from injury will improve, Ability to verbalize frustration and anger appropriately will improve, Ability to demonstrate self-control, Ability to participate in decision making will improve, Ability to verbalize feelings will improve, Ability to disclose and discuss suicidal ideas, Ability to identify and develop effective coping behaviors will improve, and Compliance with prescribed medications will improve  Medication Management:  RN will administer medications as ordered by provider, will assess and evaluate patient's response and provide education to patient for prescribed medication. RN will report any adverse and/or side effects to prescribing provider.  Therapeutic Interventions: 1 on 1 counseling sessions, Psychoeducation, Medication administration, Evaluate responses to treatment, Monitor vital signs and CBGs as ordered, Perform/monitor CIWA, COWS, AIMS and Fall Risk screenings as ordered, Perform wound care treatments as ordered.  Evaluation of Outcomes: Not Progressing   LCSW Treatment Plan for Primary Diagnosis: Bipolar 1 disorder (HCC) Long Term Goal(s): Safe transition to appropriate next level of care at discharge, Engage patient in therapeutic group addressing interpersonal concerns.  Short Term Goals: Engage patient in aftercare planning with referrals and resources, Increase social support, Increase ability to appropriately verbalize feelings, Increase emotional regulation, Facilitate acceptance of mental health diagnosis and concerns, Facilitate patient progression through stages of change regarding substance use diagnoses and concerns, Identify triggers associated with mental health/substance abuse issues, and Increase skills for wellness and recovery  Therapeutic Interventions: Assess for all discharge needs, 1 to 1 time with Social worker, Explore available resources and support systems, Assess for adequacy in community support network, Educate family and significant other(s) on suicide prevention, Complete Psychosocial Assessment, Interpersonal group therapy.  Evaluation of Outcomes: Not Progressing   Progress in Treatment: Attending groups: No. Participating in groups: No. Taking medication as prescribed: Yes. Toleration medication: Yes. Family/Significant other contact made: No, will contact:  [place], Slimp [phone]   Patient understands diagnosis: Yes. Discussing patient identified  problems/goals with staff: Yes. Medical problems stabilized or resolved: Yes. Denies suicidal/homicidal ideation: Yes. Issues/concerns per patient self-inventory: No.  New problem(s) identified: No, Describe:  None reported  New Short Term/Long Term Goal(s): detox, medication management for mood stabilization; elimination of SI thoughts; development of comprehensive mental wellness/sobriety plan  Patient Goals:  "I want to stay sober, stay calm, and stop having panic attacks"  Discharge Plan or Barriers: Patient recently admitted. CSW will continue to follow and assess for appropriate referrals and possible discharge planning.    Reason for Continuation of Hospitalization: Depression Medication stabilization Suicidal ideation Withdrawal symptoms  Estimated Length of  Stay: 5-7 days  Last 3 [place] Suicide Severity Risk Score: Flowsheet Row Ad[facility] (Current) [facility]/04/2023 [facility] ED from [facility]/25/2023 in [facility]  C-SSRS RISK CATEGORY No Risk No Risk No Risk       Last PHQ 2/9 Scores:     No data to display          Scribe for Treatment Team: Kathi Der, LCSWA 04/07/2023 11:42 AM

## 2023-04-08 DIAGNOSIS — F319 Bipolar disorder, unspecified: Secondary | ICD-10-CM | POA: Diagnosis not present

## 2023-04-08 LAB — RESPIRATORY PANEL BY PCR

## 2023-04-08 LAB — SARS CORONAVIRUS 2 BY RT PCR: SARS Coronavirus 2 by RT PCR: NEGATIVE

## 2023-04-08 NOTE — Progress Notes (Signed)
[facility] MD Progress Note  04/08/2023 12:51 PM Cody Young.  MRN:  [id number]  HPI:   Patient is a 32 year old male with a reported psychiatric history of bipolar disorder, and multiple substance use disorders, who was admitted to the psychiatric unit for evaluation of worsening depression, and also to manage substance use detoxification including alcohol, opiates, benzodiazepines, and stimulants.  On assessment today, the patient's main complaints are having fever, chills, rhinorrhea, diarrhea, and body aches.  Patient thinks he has the flu.  We discussed that we will order COVID and flu testing, but this can also be due to opioid withdrawal.  We discussed adjusting the BuSpar starting Cymbalta to address his anxiety symptoms, the patient declines any medication adjustments due to not feeling well.  He reports that his mood is still depressed.  Reports that sleep is better.  Reports appetite is better.  Concentration is okay.  Denies any SI today.  Denies any AH, which is an improvement.    Principal Problem: Bipolar 1 disorder (HCC) Diagnosis: Principal Problem:   Bipolar 1 disorder (HCC) Active Problems:   Stimulant use disorder   Alcohol use disorder   Opioid use disorder   Tobacco use disorder   GAD (generalized anxiety disorder)  Total Time spent with patient: 25 minutes  Past Psychiatric History:  Bipolar disorder.  Multiple hospitalizations in the past, most recently in June 2024.  Reports multiple psychiatric medication trials, but does not recall them.  Reports the combination of Abilify, Remeron, Cymbalta, prazosin, BuSpar is mostly helpful for him.  It is unclear if the patient was also prescribed Depakote leading up to this admission, we will order a Depakote level. Patient reports a history of 2 suicide attempts in the past.     Past Medical History:  Past Medical History:  Diagnosis Date   Bipolar 1 disorder (HCC)     Past Surgical History:  Procedure Laterality  Date   BRAIN SURGERY     Family History: History reviewed. No pertinent family history. Family Psychiatric  History: See H&P Social History:  Social History   Substance and Sexual Activity  Alcohol Use Yes   Alcohol/week: 12.0 standard drinks of alcohol   Types: 12 Cans of beer per week   Comment: Currently drinks mad dogs     Social History   Substance and Sexual Activity  Drug Use Yes   Types: Marijuana, Cocaine, "Crack" cocaine   Comment: deneis current cocaine use    Social History   Socioeconomic History   Marital status: Single    Spouse name: Not on file   Number of children: Not on file   Years of education: Not on file   Highest education level: Not on file  Occupational History   Not on file  Tobacco Use   Smoking status: Every Day    Current packs/day: 1.00    Average packs/day: 1 pack/day for 20.0 years (20.0 ttl pk-yrs)    Types: Cigarettes   Smokeless tobacco: Never  Substance and Sexual Activity   Alcohol use: Yes    Alcohol/week: 12.0 standard drinks of alcohol    Types: 12 Cans of beer per week    Comment: Currently drinks mad dogs   Drug use: Yes    Types: Marijuana, Cocaine, "Crack" cocaine    Comment: deneis current cocaine use   Sexual activity: Yes    Birth control/protection: Condom  Other Topics Concern   Not on file  Social History Narrative   Not on  file   Social Determinants of Health   Financial Resource Strain: Not on file  Food Insecurity: No Food Insecurity (04/05/2023)   Hunger Vital Sign    Worried About Running Out of Food in the Last Year: Never true    Ran Out of Food in the Last Year: Never true  Transportation Needs: No Transportation Needs (04/05/2023)   PRAPARE - Administrator, Civil Service (Medical): No    Lack of Transportation (Non-Medical): No  Physical Activity: Not on file  Stress: Not on file  Social Connections: Not on file   Additional Social History:                           Current Medications: Current Facility-Administered Medications  Medication Dose Route Frequency Provider Last Rate Last Admin   acetaminophen (TYLENOL) tablet 650 mg  650 mg Oral Q6H PRN Jearld Lesch, NP   650 mg at 04/06/23 1809   alum & mag hydroxide-simeth (MAALOX/MYLANTA) 200-200-20 MG/5ML suspension 30 mL  30 mL Oral Q4H PRN Jearld Lesch, NP       busPIRone (BUSPAR) tablet 10 mg  10 mg Oral BID Starleen Blue, NP   10 mg at 04/08/23 1610   cloNIDine (CATAPRES) tablet 0.1 mg  0.1 mg Oral Q4H PRN Lathen Seal, Harrold Donath, MD       cloNIDine (CATAPRES) tablet 0.1 mg  0.1 mg Oral Q4H PRN Lella Mullany, Harrold Donath, MD       cloNIDine (CATAPRES) tablet 0.1 mg  0.1 mg Oral BH-qamhs Zuriah Bordas, Harrold Donath, MD   0.1 mg at 04/08/23 9604   Followed by   Melene Muller ON 04/10/2023] cloNIDine (CATAPRES) tablet 0.1 mg  0.1 mg Oral QAC breakfast Teddie Curd, Harrold Donath, MD       dicyclomine (BENTYL) tablet 20 mg  20 mg Oral Q6H PRN Phineas Inches, MD   20 mg at 04/08/23 5409   diphenhydrAMINE (BENADRYL) capsule 50 mg  50 mg Oral TID PRN Jearld Lesch, NP       Or   diphenhydrAMINE (BENADRYL) injection 50 mg  50 mg Intramuscular TID PRN Jearld Lesch, NP       haloperidol (HALDOL) tablet 5 mg  5 mg Oral TID PRN Jearld Lesch, NP       Or   haloperidol lactate (HALDOL) injection 5 mg  5 mg Intramuscular TID PRN Jearld Lesch, NP       hydrOXYzine (ATARAX) tablet 25 mg  25 mg Oral TID PRN Jearld Lesch, NP   25 mg at 04/07/23 2117   loperamide (IMODIUM) capsule 2-4 mg  2-4 mg Oral PRN Churchill Grimsley, Harrold Donath, MD       LORazepam (ATIVAN) tablet 2 mg  2 mg Oral TID PRN Jearld Lesch, NP       Or   LORazepam (ATIVAN) injection 2 mg  2 mg Intramuscular TID PRN Jearld Lesch, NP       LORazepam (ATIVAN) tablet 1 mg  1 mg Oral BID Ader Fritze, MD   1 mg at 04/08/23 8119   Followed by   Melene Muller ON 04/09/2023] LORazepam (ATIVAN) tablet 1 mg  1 mg Oral Daily Quavion Boule, MD       magnesium  hydroxide (MILK OF MAGNESIA) suspension 30 mL  30 mL Oral Daily PRN Dixon, Elray Buba, NP       methocarbamol (ROBAXIN) tablet 500 mg  500 mg Oral Q8H PRN Phineas Inches, MD  500 mg at 04/07/23 2118   multivitamin with minerals tablet 1 tablet  1 tablet Oral Daily Jearld Lesch, NP   1 tablet at 04/08/23 8295   naproxen (NAPROSYN) tablet 500 mg  500 mg Oral BID PRN Phineas Inches, MD   500 mg at 04/08/23 6213   nicotine (NICODERM CQ - dosed in mg/24 hours) patch 21 mg  21 mg Transdermal Daily Vennessa Affinito, Harrold Donath, MD   21 mg at 04/08/23 0809   nicotine polacrilex (NICORETTE) gum 2 mg  2 mg Oral PRN Jearld Lesch, NP       ondansetron (ZOFRAN) tablet 4 mg  4 mg Oral Q8H PRN Jearld Lesch, NP       pantoprazole (PROTONIX) EC tablet 40 mg  40 mg Oral Daily [place]ington Devine, Harrold Donath, MD   40 mg at 04/08/23 0865   thiamine (Vitamin B-1) tablet 100 mg  100 mg Oral Daily Lerry Liner M, NP   100 mg at 04/08/23 7846   traZODone (DESYREL) tablet 50 mg  50 mg Oral QHS PRN Phineas Inches, MD   50 mg at 04/07/23 2117    Lab Results:  Results for orders placed or performed during the hospital encounter of 04/05/23 (from the past 48 hour(s))  SARS Coronavirus 2 by RT PCR (hospital order, performed in [facility] hospital lab) *cepheid single result test* Anterior Nasal Swab     Status: None   Collection Time: 04/08/23 11:29 AM   Specimen: Anterior Nasal Swab  Result Value Ref Range   SARS Coronavirus 2 by RT PCR NEGATIVE NEGATIVE    Comment: (NOTE) SARS-CoV-2 target nucleic acids are NOT DETECTED.  The SARS-CoV-2 RNA is generally detectable in upper and lower respiratory specimens during the acute phase of infection. The lowest concentration of SARS-CoV-2 viral copies this assay can detect is 250 copies / mL. A negative result does not preclude SARS-CoV-2 infection and should not be used as the sole basis for treatment or other patient management decisions.  A negative result may occur  with improper specimen collection / handling, submission of specimen other than nasopharyngeal swab, presence of viral mutation(s) within the areas targeted by this assay, and inadequate number of viral copies (<250 copies / mL). A negative result must be combined with clinical observations, patient history, and epidemiological information.  Fact Sheet for Patients:   RoadLapTop.co.za  Fact Sheet for Healthcare Providers: http://kim-miller.com/  This test is not yet approved or  cleared by the [place] FDA and has been authorized for detection and/or diagnosis of SARS-CoV-2 by FDA under an Emergency Use Authorization (EUA).  This EUA will remain in effect (meaning this test can be used) for the duration of the COVID-19 declaration under Section 564(b)(1) of the Act, 21 U.S.C. section 360bbb-3(b)(1), unless the authorization is terminated or revoked sooner.  Performed at [facility], [address], [place], [place] [address]     Blood Alcohol level:  Lab Results  Component Value Date   [place] <10 04/04/2023   ETH <10 12/19/2022    Metabolic Disorder Labs: Lab Results  Component Value Date   HGBA1C 5.5 04/06/2023   MPG 111.15 04/06/2023   MPG 103 09/02/2022   No results found for: "PROLACTIN" Lab Results  Component Value Date   CHOL 166 04/06/2023   TRIG 96 04/06/2023   HDL 34 (L) 04/06/2023   CHOLHDL 4.9 04/06/2023   VLDL 19 04/06/2023   LDLCALC 113 (H) 04/06/2023   LDLCALC 132 (H) 09/02/2022  Physical Findings: AIMS:  , ,  ,  ,    CIWA:  CIWA-Ar Total: 5 COWS:  COWS Total Score: 4    Psychiatric Specialty Exam:  Presentation  General Appearance:  Appropriate for Environment; Casual; Fairly Groomed  Eye Contact: Fair  Speech: Clear and Coherent  Speech Volume: Normal  Handedness: Right   Mood and Affect  Mood: Anxious; Depressed  Affect: Congruent   Thought Process   Thought Processes: Coherent  Descriptions of Associations:Intact  Orientation:Full (Time, Place and Person)  Thought Content:Logical  History of Schizophrenia/Schizoaffective disorder:No  Duration of Psychotic Symptoms:N/A  Hallucinations:Hallucinations: None   Ideas of Reference:None  Suicidal Thoughts:Suicidal Thoughts: No   Homicidal Thoughts:Homicidal Thoughts: No    Sensorium  Memory: Recent Fair  Judgment: Fair  Insight: Fair   Art therapist  Concentration: Fair  Attention Span: Good  Recall: Fair  Fund of Knowledge: Fair  Language: Good   Psychomotor Activity  Psychomotor Activity: Psychomotor Activity: Normal    Assets  Assets: Communication Skills   Sleep  Sleep: Sleep: Good     Physical Exam: Physical Exam Vitals reviewed.  Constitutional:      General: He is not in acute distress.    Appearance: He is not toxic-appearing.  Pulmonary:     Effort: Pulmonary effort is normal. No respiratory distress.  Neurological:     Mental Status: He is alert.     Motor: No weakness.     Gait: Gait normal.    Review of Systems  Constitutional:  Negative for chills and fever.  Cardiovascular:  Negative for chest pain and palpitations.  Neurological:  Negative for dizziness, tingling, tremors and headaches.  Psychiatric/Behavioral:  Positive for depression, hallucinations and substance abuse. Negative for memory loss and suicidal ideas. The patient is nervous/anxious. The patient does not have insomnia.   All other systems reviewed and are negative.  Blood pressure 106/78, pulse (!) 55, temperature (!) 97.5 F (36.4 C), temperature source Oral, resp. rate 20, height 6\' 2"  (1.88 m), weight 123.6 kg, SpO2 97%. Body mass index is 34.97 kg/m.   Treatment Plan Summary:  Daily contact with patient to assess and evaluate symptoms and progress in treatment and Medication management   ASSESSMENT:   Diagnoses / Active  Problems: Bipolar disorder, current episode is depressed GAD Stimulant use disorder Alcohol use disorder Sedative-hypnotic use disorder Opioid use disorder Tobacco use disorder     PLAN: Safety and Monitoring:             --  Voluntary admission to inpatient psychiatric unit for safety, stabilization and treatment             -- Daily contact with patient to assess and evaluate symptoms and progress in treatment             -- Patient's case to be discussed in multi-disciplinary team meeting             -- Observation Level : q15 minute checks             -- Vital signs:  q12 hours             -- Precautions: suicide, elopement, and assault   2. Psychiatric Diagnoses and Treatment:               -Hold Cymbalta due to elevated LFTs -Hold Depakote due to elevated LFTs (pt not taking this PTA but restarted on admission by admitting provider) -Patient received Abilify LAI within the past 3  to 4 weeks.  We will need to confirm date of last dose, formulation and dose, and decide whether to resume this or not -On admission, we discontinued oral Abilify that was restarted on admission, as the patient is on the Abilify LAI -Dc Remeron 15 mg nightly for insomnia -Dc prazosin at 2 mg nightly for nightmares -Continue BuSpar 10 mg twice daily for anxiety   -Continue Ativan taper plus as needed Ativan for alcohol and benzodiazepine withdrawal -Continue clonidine taper and symptomatic treatment for any opioid withdrawal.  Also starting clonidine as needed for breakthrough opioid withdrawal symptoms   -Continue Protonix 40 mg daily for GERD -Continue Trazodone 50 mg nightly PRN for sleep -Continue clonidine PRN for elevated BP with parameters     --  The risks/benefits/side-effects/alternatives to this medication were discussed in detail with the patient and time was given for questions. The patient consents to medication trial.                -- Metabolic profile and EKG monitoring obtained  while on an atypical antipsychotic (BMI: Lipid Panel: HbgA1c: QTc:)              -- Encouraged patient to participate in unit milieu and in scheduled group therapies              -- Short Term Goals: Ability to identify changes in lifestyle to reduce recurrence of condition will improve, Ability to verbalize feelings will improve, Ability to disclose and discuss suicidal ideas, Ability to demonstrate self-control will improve, Ability to identify and develop effective coping behaviors will improve, Ability to maintain clinical measurements within normal limits will improve, Compliance with prescribed medications will improve, and Ability to identify triggers associated with substance abuse/mental health issues will improve             -- Long Term Goals: Improvement in symptoms so as ready for discharge                3. Medical Issues Being Addressed:              Tobacco Use Disorder             -- Nicotine patch 21mg /24 hours ordered plus nicotine gum PRN              -- Smoking cessation encouraged   Order COVID and fluid testing for symptoms as above  4. Discharge Planning:              -- Social work and case management to assist with discharge planning and identification of hospital follow-up needs prior to discharge             -- Estimated LOS: 5-7 days             -- Discharge Concerns: Need to establish a safety plan; Medication compliance and effectiveness             -- Discharge Goals: Return home with outpatient referrals for mental health follow-up including medication management/psychotherapy    Cristy Hilts, MD 04/08/2023, 12:51 PM   Total Time Spent in Direct Patient Care:  I personally spent 35 minutes on the unit in direct patient care. The direct patient care time included face-to-face time with the patient, reviewing the patient's chart, communicating with other professionals, and coordinating care. Greater than 50% of this time was spent in counseling or  coordinating care with the patient regarding goals of hospitalization, psycho-education, and discharge planning needs.

## 2023-04-08 NOTE — Plan of Care (Signed)
  Problem: Education: Goal: Knowledge of [facility] Education information/materials will improve Outcome: Progressing Goal: Emotional status will improve Outcome: Progressing   Problem: Activity: Goal: Sleeping patterns will improve Outcome: Progressing   Problem: Coping: Goal: Ability to demonstrate self-control will improve Outcome: Progressing   Problem: Health Behavior/Discharge Planning: Goal: Compliance with treatment plan for underlying cause of condition will improve Outcome: Progressing   Problem: Safety: Goal: Periods of time without injury will increase Outcome: Progressing

## 2023-04-08 NOTE — Group Note (Signed)
[facility] LCSW Group Therapy Note   Group Date: 04/08/2023 Start Time: 1100 End Time: 1145  Type of Therapy/Topic:  Group Therapy:  Feelings about Diagnosis  Participation Level:  Did Not Attend      Description of Group:    This group will allow patients to explore their thoughts and feelings about diagnoses they have received. Patients will be guided to explore their level of understanding and acceptance of these diagnoses. Facilitator will encourage patients to process their thoughts and feelings about the reactions of others to their diagnosis, and will guide patients in identifying ways to discuss their diagnosis with significant others in their lives. This group will be process-oriented, with patients participating in exploration of their own experiences as well as giving and receiving support and challenge from other group members.   Therapeutic Goals: 1. Patient will demonstrate understanding of diagnosis as evidence by identifying two or more symptoms of the disorder:  2. Patient will be able to express two feelings regarding the diagnosis 3. Patient will demonstrate ability to communicate their needs through discussion and/or role plays      Therapeutic Modalities:   Cognitive Behavioral Therapy Brief Therapy Feelings Identification    Kynedi Profitt S Latoia Eyster, LCSW

## 2023-04-08 NOTE — [facility] Suicide Risk Assessment (Signed)
[facility] INPATIENT:  Family/Significant Other Suicide Prevention Education  Suicide Prevention Education:  Contact Attempts: Jamani Bearce (mom), [phone] has been identified by the patient as the family member/significant other with whom the patient will be residing, and identified as the person(s) who will aid the patient in the event of a mental health crisis.  With written consent from the patient, two attempts were made to provide suicide prevention education, prior to and/or following the patient's discharge.  We were unsuccessful in providing suicide prevention education.  A suicide education pamphlet was given to the patient to share with family/significant other.  Date and time of first attempt:04/07/23/9:55 AM Date and time of second attempt:04/08/2023/10:15 AM  Kathi Der 04/08/2023, 10:11 AM

## 2023-04-08 NOTE — [facility] Group Notes (Signed)
Pt did not attend goals group. 

## 2023-04-08 NOTE — [facility] Group Notes (Signed)
[facility] Group Notes:  (Nursing/MHT/Case Management/Adjunct)  Date:  04/08/2023  Time:  9:32 PM  Type of Therapy:   Wrap-up group  Participation Level:  Did Not Attend  Participation Quality:    Affect:    Cognitive:    Insight:    Engagement in Group:    Modes of Intervention:    Summary of Progress/Problems: Pt refused to attend group. He was provided with a coping skills list.   Cody Young 04/08/2023, 9:32 PM

## 2023-04-08 NOTE — Plan of Care (Signed)
  Problem: Education: Goal: Emotional status will improve Outcome: Progressing   Problem: Activity: Goal: Interest or engagement in activities will improve Outcome: Progressing Goal: Sleeping patterns will improve Outcome: Progressing   Problem: Coping: Goal: Ability to verbalize frustrations and anger appropriately will improve Outcome: Progressing Goal: Ability to demonstrate self-control will improve Outcome: Progressing   Problem: Coping: Goal: Ability to demonstrate self-control will improve Outcome: Progressing   Problem: Safety: Goal: Periods of time without injury will increase Outcome: Progressing

## 2023-04-08 NOTE — Progress Notes (Signed)
   04/08/23 0807  Psych Admission Type (Psych Patients Only)  Admission Status Voluntary  Psychosocial Assessment  Patient Complaints Substance abuse;Anxiety;Depression  Eye Contact Fair  Facial Expression Anxious;Sad  Affect Anxious;Depressed  Speech Logical/coherent  Interaction Assertive  Motor Activity Slow  Appearance/Hygiene Unremarkable  Behavior Characteristics Appropriate to situation;Cooperative  Mood Depressed;Anxious  Aggressive Behavior  Effect No apparent injury  Thought Process  Coherency WDL  Content WDL  Delusions None reported or observed  Perception WDL  Hallucination None reported or observed  Judgment Impaired  Confusion WDL  Danger to Self  Current suicidal ideation? Denies  Agreement Not to Harm Self Yes  Description of Agreement Verbal  Danger to Others  Danger to Others None reported or observed

## 2023-04-08 NOTE — Progress Notes (Signed)
   04/08/23 2245  Psych Admission Type (Psych Patients Only)  Admission Status Voluntary  Psychosocial Assessment  Patient Complaints Substance abuse  Eye Contact Fair  Facial Expression Anxious  Affect Anxious  Speech Logical/coherent  Interaction Assertive  Motor Activity Slow  Appearance/Hygiene Unremarkable  Behavior Characteristics Cooperative  Mood Anxious;Depressed  Aggressive Behavior  Effect No apparent injury  Thought Process  Coherency WDL  Content WDL  Delusions WDL  Perception Hallucinations  Hallucination Auditory;Visual  Judgment Impaired  Confusion WDL  Danger to Self  Current suicidal ideation? Denies  Danger to Others  Danger to Others None reported or observed

## 2023-04-09 DIAGNOSIS — F319 Bipolar disorder, unspecified: Secondary | ICD-10-CM | POA: Diagnosis not present

## 2023-04-09 NOTE — [facility] Group Notes (Signed)
Pt did not attend AA group  

## 2023-04-09 NOTE — [facility] Suicide Risk Assessment (Signed)
[facility] INPATIENT:  Family/Significant Other Suicide Prevention Education  Suicide Prevention Education:  Education Completed; Daxson Yokel (mom) [phone], has been identified by the patient as the family member/significant other with whom the patient will be residing, and identified as the person(s) who will aid the patient in the event of a mental health crisis (suicidal ideations/suicide attempt).  With written consent from the patient, the family member/significant other has been provided the following suicide prevention education, prior to the and/or following the discharge of the patient.  Talked with mom regarding pt's hospitalization. Mom states pt's step dad was placed on hospice recently and mom thinks that is pt's biggest trigger/stressor. Mom states pt is not allowed to come back home until pt gets some type of inpatient treatment because she is not able to keep eyes on him at all times; especially because if pt's step father passes away, she is afraid of what might happen to pt if he is not in a 24/7 facility.   Mom states there are no firearms or weapons in the home. Mom states medications that are not prescribed for pt are locked in a lock box. Pt only has medications that are prescribed for him.    The suicide prevention education provided includes the following: Suicide risk factors Suicide prevention and interventions National Suicide Hotline telephone number [facility] assessment telephone number [address] [facility] Emergency Assistance 911 [facility] and/or Residential Mobile Crisis Unit telephone number  Request made of family/significant other to: Remove weapons (e.g., guns, rifles, knives), all items previously/currently identified as safety concern.   Remove drugs/medications (over-the-counter, prescriptions, illicit drugs), all items previously/currently identified as a safety concern.  The family member/significant other verbalizes understanding of the  suicide prevention education information provided.  The family member/significant other agrees to remove the items of safety concern listed above.  Kathi Der 04/09/2023, 2:08 PM

## 2023-04-09 NOTE — Progress Notes (Signed)
Talked with patient after completing SPE with mom, Cody Young. Mom reports that pt cannot return home unless pt goes to inpatient rehab first. CSW gave this information to pt who then responded "well it looks like I'm going to a homeless shelter then doesn't it?" Pt was asked again if interested in any type of treatment and pt refused.   Pt stated he will d/c to [facility] in [place], [place]. Pt will need transportation to homeless shelter. Pt aware d/c date is expected to be Monday 04/12/23. CSW gave pt shelter contact information to call over the weekend. Pt agreed and is looking forward to d/c Monday.

## 2023-04-09 NOTE — Group Note (Signed)
Recreation Therapy Group Note   Group Topic:Problem Solving  Group Date: 04/09/2023 Start Time: 0935 End Time: 1007 Facilitators: Beuna Bolding-McCall, LRT,CTRS Location: [address] Dayroom   Goal Area(s) Addresses:  Patient will effectively work in a team with other group members. Patient will verbalize importance of using appropriate problem solving techniques.  Patient will identify positive change associated with effective problem solving skills.   Group Description: Brain Teasers. Patients were given two sheets of brain teasers. Patients worked together to Radiation protection practitioner.     Education Outcome: Acknowledges understanding/In group clarification offered/Needs additional education.    Affect/Mood: Flat   Participation Level: Minimal   Participation Quality: Independent   Behavior: Attentive    Speech/Thought Process: Focused   Insight: Moderate   Judgement: Moderate   Modes of Intervention: Group work   Patient Response to Interventions:  Engaged   Education Outcome:  In group clarification offered    Clinical Observations/Individualized Feedback: Pt was attentive and getting into the activity. Pt was called out of group and didn't return.      Plan: Continue to engage patient in RT group sessions 2-3x/week.   Crystallee Werden-McCall, LRT,CTRS 04/09/2023 12:04 PM

## 2023-04-09 NOTE — Plan of Care (Signed)
  Problem: Education: Goal: Knowledge of [facility] Education information/materials will improve Outcome: Progressing Goal: Emotional status will improve Outcome: Progressing Goal: Mental status will improve Outcome: Progressing   Problem: Activity: Goal: Sleeping patterns will improve Outcome: Progressing   Problem: Safety: Goal: Periods of time without injury will increase Outcome: Progressing

## 2023-04-09 NOTE — Progress Notes (Signed)
D: Patient is alert, oriented, and cooperative. Denies SI, HI, AVH, and verbally contracts for safety. Patient reports he slept fair last night with sleeping medication. Patient reports his appetite as fair, energy level as low, and concentration as poor. Patient rates his depression 0/10, hopelessness 0/10, and anxiety 10/10.    A: Scheduled medications administered per MD order. PRN robaxin and naproxen administered. Support provided. Patient educated on safety on the unit and medications. Routine safety checks every 15 minutes. Patient stated understanding to tell nurse about any new physical symptoms. Patient understands to tell staff of any needs.     R: No adverse drug reactions noted. Patient remains safe at this time and will continue to monitor.    04/09/23 0900  Psych Admission Type (Psych Patients Only)  Admission Status Voluntary  Psychosocial Assessment  Patient Complaints Substance abuse;Anxiety;Depression  Eye Contact Fair  Facial Expression Anxious  Affect Anxious;Depressed  Speech Logical/coherent  Interaction Assertive  Motor Activity Slow  Appearance/Hygiene Unremarkable  Behavior Characteristics Cooperative;Appropriate to situation  Mood Depressed;Anxious  Thought Process  Coherency WDL  Content WDL  Delusions None reported or observed  Perception WDL  Hallucination None reported or observed  Judgment Impaired  Confusion None  Danger to Self  Current suicidal ideation? Denies  Agreement Not to Harm Self Yes  Description of Agreement verbal  Danger to Others  Danger to Others None reported or observed

## 2023-04-09 NOTE — Progress Notes (Signed)
[facility] MD Progress Note  04/09/2023 4:45 PM Cody Young.  MRN:  [id number]  HPI:   Patient is a 32 year old male with a reported psychiatric history of bipolar disorder, and multiple substance use disorders, who was admitted to the psychiatric unit for evaluation of worsening depression, and also to manage substance use detoxification including alcohol, opiates, benzodiazepines, and stimulants.  During today's encounter, patient's overall presentation is better than at time of hospitalization, and in the days following hospitalization, as per both objective and subjective assessments.  Patient is reporting an improved sleep level, improved appetite, improved energy levels, complaining of still feeling "shaky", but no tremors found during my assessment.  He is also reporting left hip pain of 10, with 10 being worst, but is observed to be walking up and down the hallways, and to the day room, with no overt signs of pain; he is ambulating with a steady gait, is not localizing to any pain, does not seem to be in any distress.  It is possible that he is overreporting symptoms in hopes of gaining more potent pain meds than ibuprofen and Tylenol that are being offered to him here at the hospital.  Patient has been educated that he will not be given any narcotic medications for pain during this hospitalization.  He has been educated that Robaxin 500 mg as needed every 8 hours is available to him for muscle pain, on a as needed basis, and that he will not be discharging with a prescription for this medication.  Patient verbalizes understanding.  Today, he denies SI/HI/AVH, denies paranoia, denies delusional thoughts.  Attention to personal hygiene and grooming appears to be improving, and he looks less disheveled.  He is tolerating medications well, denies medication related side effects.  He is continuing to tolerate the clonidine taper well, and we will be continuing medications as listed below, with no  changes today.  Tentative discharge date is on 10/21, patient asked that his ACT team be notified so that they can see him on that day.  As per CSW, his mother does not want him back home, unless he goes to long-term treatment, which patient is not open to going at this time.  Patient states that he would like to go to a shelter after discharge, and CSW is working on getting him placement in a shelter on his discharge date, closer to his home.  Principal Problem: Bipolar 1 disorder (HCC) Diagnosis: Principal Problem:   Bipolar 1 disorder (HCC) Active Problems:   Stimulant use disorder   Alcohol use disorder   Opioid use disorder   Tobacco use disorder   GAD (generalized anxiety disorder)  Total Time spent with patient: 25 minutes  Past Psychiatric History:  Bipolar disorder.  Multiple hospitalizations in the past, most recently in June 2024.  Reports multiple psychiatric medication trials, but does not recall them.  Reports the combination of Abilify, Remeron, Cymbalta, prazosin, BuSpar is mostly helpful for him.  It is unclear if the patient was also prescribed Depakote leading up to this admission, we will order a Depakote level. Patient reports a history of 2 suicide attempts in the past.     Past Medical History:  Past Medical History:  Diagnosis Date   Bipolar 1 disorder (HCC)     Past Surgical History:  Procedure Laterality Date   BRAIN SURGERY     Family History: History reviewed. No pertinent family history. Family Psychiatric  History: See H&P Social History:  Social History  Substance and Sexual Activity  Alcohol Use Yes   Alcohol/week: 12.0 standard drinks of alcohol   Types: 12 Cans of beer per week   Comment: Currently drinks mad dogs     Social History   Substance and Sexual Activity  Drug Use Yes   Types: Marijuana, Cocaine, "Crack" cocaine   Comment: deneis current cocaine use    Social History   Socioeconomic History   Marital status: Single     Spouse name: Not on file   Number of children: Not on file   Years of education: Not on file   Highest education level: Not on file  Occupational History   Not on file  Tobacco Use   Smoking status: Every Day    Current packs/day: 1.00    Average packs/day: 1 pack/day for 20.0 years (20.0 ttl pk-yrs)    Types: Cigarettes   Smokeless tobacco: Never  Substance and Sexual Activity   Alcohol use: Yes    Alcohol/week: 12.0 standard drinks of alcohol    Types: 12 Cans of beer per week    Comment: Currently drinks mad dogs   Drug use: Yes    Types: Marijuana, Cocaine, "Crack" cocaine    Comment: deneis current cocaine use   Sexual activity: Yes    Birth control/protection: Condom  Other Topics Concern   Not on file  Social History Narrative   Not on file   Social Determinants of Health   Financial Resource Strain: Not on file  Food Insecurity: No Food Insecurity (04/05/2023)   Hunger Vital Sign    Worried About Running Out of Food in the Last Year: Never true    Ran Out of Food in the Last Year: Never true  Transportation Needs: No Transportation Needs (04/05/2023)   PRAPARE - Administrator, Civil Service (Medical): No    Lack of Transportation (Non-Medical): No  Physical Activity: Not on file  Stress: Not on file  Social Connections: Not on file  Current Medications: Current Facility-Administered Medications  Medication Dose Route Frequency Provider Last Rate Last Admin   acetaminophen (TYLENOL) tablet 650 mg  650 mg Oral Q6H PRN Jearld Lesch, NP   650 mg at 04/06/23 1809   alum & mag hydroxide-simeth (MAALOX/MYLANTA) 200-200-20 MG/5ML suspension 30 mL  30 mL Oral Q4H PRN Jearld Lesch, NP       busPIRone (BUSPAR) tablet 10 mg  10 mg Oral BID Starleen Blue, NP   10 mg at 04/09/23 6045   cloNIDine (CATAPRES) tablet 0.1 mg  0.1 mg Oral Q4H PRN Massengill, Harrold Donath, MD       cloNIDine (CATAPRES) tablet 0.1 mg  0.1 mg Oral Q4H PRN Massengill, Harrold Donath, MD        cloNIDine (CATAPRES) tablet 0.1 mg  0.1 mg Oral BH-qamhs Massengill, Nathan, MD   0.1 mg at 04/09/23 4098   Followed by   Melene Muller ON 04/10/2023] cloNIDine (CATAPRES) tablet 0.1 mg  0.1 mg Oral QAC breakfast Massengill, Harrold Donath, MD       dicyclomine (BENTYL) tablet 20 mg  20 mg Oral Q6H PRN Massengill, Harrold Donath, MD   20 mg at 04/08/23 1191   diphenhydrAMINE (BENADRYL) capsule 50 mg  50 mg Oral TID PRN Jearld Lesch, NP       Or   diphenhydrAMINE (BENADRYL) injection 50 mg  50 mg Intramuscular TID PRN Jearld Lesch, NP       haloperidol (HALDOL) tablet 5 mg  5 mg Oral TID PRN  Jearld Lesch, NP       Or   haloperidol lactate (HALDOL) injection 5 mg  5 mg Intramuscular TID PRN Jearld Lesch, NP       hydrOXYzine (ATARAX) tablet 25 mg  25 mg Oral TID PRN Jearld Lesch, NP   25 mg at 04/09/23 1643   LORazepam (ATIVAN) tablet 2 mg  2 mg Oral TID PRN Jearld Lesch, NP       Or   LORazepam (ATIVAN) injection 2 mg  2 mg Intramuscular TID PRN Jearld Lesch, NP       magnesium hydroxide (MILK OF MAGNESIA) suspension 30 mL  30 mL Oral Daily PRN Jearld Lesch, NP       methocarbamol (ROBAXIN) tablet 500 mg  500 mg Oral Q8H PRN Massengill, Harrold Donath, MD   500 mg at 04/09/23 1002   multivitamin with minerals tablet 1 tablet  1 tablet Oral Daily Jearld Lesch, NP   1 tablet at 04/09/23 0817   naproxen (NAPROSYN) tablet 500 mg  500 mg Oral BID PRN Phineas Inches, MD   500 mg at 04/09/23 1002   nicotine (NICODERM CQ - dosed in mg/24 hours) patch 21 mg  21 mg Transdermal Daily Massengill, Harrold Donath, MD   21 mg at 04/09/23 0818   nicotine polacrilex (NICORETTE) gum 2 mg  2 mg Oral PRN Jearld Lesch, NP       ondansetron (ZOFRAN) tablet 4 mg  4 mg Oral Q8H PRN Dixon, Rashaun M, NP       pantoprazole (PROTONIX) EC tablet 40 mg  40 mg Oral Daily Massengill, Harrold Donath, MD   40 mg at 04/09/23 4098   thiamine (Vitamin B-1) tablet 100 mg  100 mg Oral Daily Lerry Liner M, NP   100 mg at 04/09/23  1191   traZODone (DESYREL) tablet 50 mg  50 mg Oral QHS PRN Phineas Inches, MD   50 mg at 04/08/23 2103    Lab Results:  Results for orders placed or performed during the hospital encounter of 04/05/23 (from the past 48 hour(s))  SARS Coronavirus 2 by RT PCR (hospital order, performed in [facility] hospital lab) *cepheid single result test* Anterior Nasal Swab     Status: None   Collection Time: 04/08/23 11:29 AM   Specimen: Anterior Nasal Swab  Result Value Ref Range   SARS Coronavirus 2 by RT PCR NEGATIVE NEGATIVE    Comment: (NOTE) SARS-CoV-2 target nucleic acids are NOT DETECTED.  The SARS-CoV-2 RNA is generally detectable in upper and lower respiratory specimens during the acute phase of infection. The lowest concentration of SARS-CoV-2 viral copies this assay can detect is 250 copies / mL. A negative result does not preclude SARS-CoV-2 infection and should not be used as the sole basis for treatment or other patient management decisions.  A negative result may occur with improper specimen collection / handling, submission of specimen other than nasopharyngeal swab, presence of viral mutation(s) within the areas targeted by this assay, and inadequate number of viral copies (<250 copies / mL). A negative result must be combined with clinical observations, patient history, and epidemiological information.  Fact Sheet for Patients:   RoadLapTop.co.za  Fact Sheet for Healthcare Providers: http://kim-miller.com/  This test is not yet approved or  cleared by the [place] FDA and has been authorized for detection and/or diagnosis of SARS-CoV-2 by FDA under an Emergency Use Authorization (EUA).  This EUA will remain in effect (meaning this test can  be used) for the duration of the COVID-19 declaration under Section 564(b)(1) of the Act, 21 U.S.C. section 360bbb-3(b)(1), unless the authorization is terminated or revoked  sooner.  Performed at [facility], [address] [place], [place] [address]   Respiratory (~20 pathogens) panel by PCR     Status: None   Collection Time: 04/08/23 11:29 AM   Specimen: Nasopharyngeal Swab; Respiratory  Result Value Ref Range   Adenovirus NOT DETECTED NOT DETECTED   Coronavirus 229E NOT DETECTED NOT DETECTED    Comment: (NOTE) The Coronavirus on the Respiratory Panel, DOES NOT test for the novel  Coronavirus (2019 nCoV)    Coronavirus HKU1 NOT DETECTED NOT DETECTED   Coronavirus NL63 NOT DETECTED NOT DETECTED   Coronavirus OC43 NOT DETECTED NOT DETECTED   Metapneumovirus NOT DETECTED NOT DETECTED   Rhinovirus / Enterovirus NOT DETECTED NOT DETECTED   Influenza A NOT DETECTED NOT DETECTED   Influenza B NOT DETECTED NOT DETECTED   Parainfluenza Virus 1 NOT DETECTED NOT DETECTED   Parainfluenza Virus 2 NOT DETECTED NOT DETECTED   Parainfluenza Virus 3 NOT DETECTED NOT DETECTED   Parainfluenza Virus 4 NOT DETECTED NOT DETECTED   Respiratory Syncytial Virus NOT DETECTED NOT DETECTED   Bordetella pertussis NOT DETECTED NOT DETECTED   Bordetella Parapertussis NOT DETECTED NOT DETECTED   Chlamydophila pneumoniae NOT DETECTED NOT DETECTED   Mycoplasma pneumoniae NOT DETECTED NOT DETECTED    Comment: Performed at [facility], [address], [place], [place] [address]    Blood Alcohol level:  Lab Results  Component Value Date   [facility] <10 04/04/2023   ETH <10 12/19/2022    Metabolic Disorder Labs: Lab Results  Component Value Date   HGBA1C 5.5 04/06/2023   MPG 111.15 04/06/2023   MPG 103 09/02/2022   No results found for: "PROLACTIN" Lab Results  Component Value Date   CHOL 166 04/06/2023   TRIG 96 04/06/2023   HDL 34 (L) 04/06/2023   CHOLHDL 4.9 04/06/2023   VLDL 19 04/06/2023   LDLCALC 113 (H) 04/06/2023   LDLCALC 132 (H) 09/02/2022    Physical Findings: AIMS:  , ,  ,  ,    CIWA:  CIWA-Ar Total: 6 COWS:  COWS Total  Score: 2    Psychiatric Specialty Exam:  Presentation  General Appearance:  Appropriate for Environment; Fairly Groomed  Eye Contact: Good  Speech: Clear and Coherent  Speech Volume: Normal  Handedness: Right   Mood and Affect  Mood: Depressed  Affect: Congruent   Thought Process  Thought Processes: Coherent  Descriptions of Associations:Intact  Orientation:Full (Time, Place and Person)  Thought Content:Logical  History of Schizophrenia/Schizoaffective disorder:Yes  Duration of Psychotic Symptoms:Greater than six months  Hallucinations:Hallucinations: None    Ideas of Reference:None  Suicidal Thoughts:Suicidal Thoughts: No    Homicidal Thoughts:Homicidal Thoughts: No     Sensorium  Memory: Immediate Good  Judgment: Fair  Insight: Fair   Chartered certified accountant: Fair  Attention Span: Fair  Recall: Fair  Fund of Knowledge: Fair  Language: Fair   Psychomotor Activity  Psychomotor Activity: Psychomotor Activity: Normal     Assets  Assets: Resilience   Sleep  Sleep: Sleep: Good      Physical Exam: Physical Exam Vitals reviewed.  Constitutional:      General: He is not in acute distress.    Appearance: He is not toxic-appearing.  Pulmonary:     Effort: Pulmonary effort is normal. No respiratory distress.  Neurological:  Mental Status: He is alert.     Motor: No weakness.     Gait: Gait normal.    Review of Systems  Constitutional:  Negative for chills and fever.  Cardiovascular:  Negative for chest pain and palpitations.  Neurological:  Negative for dizziness, tingling, tremors and headaches.  Psychiatric/Behavioral:  Positive for depression, hallucinations and substance abuse. Negative for memory loss and suicidal ideas. The patient is nervous/anxious. The patient does not have insomnia.   All other systems reviewed and are negative.  Blood pressure 134/84, pulse 63, temperature  97.7 F (36.5 C), temperature source Oral, resp. rate 16, height 6\' 2"  (1.88 m), weight 123.6 kg, SpO2 100%. Body mass index is 34.97 kg/m.   Treatment Plan Summary:  Daily contact with patient to assess and evaluate symptoms and progress in treatment and Medication management   ASSESSMENT:   Diagnoses / Active Problems: Bipolar disorder, current episode is depressed GAD Stimulant use disorder Alcohol use disorder Sedative-hypnotic use disorder Opioid use disorder Tobacco use disorder     PLAN: Safety and Monitoring:             --  Voluntary admission to inpatient psychiatric unit for safety, stabilization and treatment             -- Daily contact with patient to assess and evaluate symptoms and progress in treatment             -- Patient's case to be discussed in multi-disciplinary team meeting             -- Observation Level : q15 minute checks             -- Vital signs:  q12 hours             -- Precautions: suicide, elopement, and assault   2. Psychiatric Diagnoses and Treatment:               -Hold Cymbalta due to elevated LFTs -Hold Depakote due to elevated LFTs (pt not taking this PTA but restarted on admission by admitting provider) -Patient received Abilify LAI within the past 3 to 4 weeks.  We will need to confirm date of last dose, formulation and dose, and decide whether to resume this or not -On admission, we discontinued oral Abilify that was restarted on admission, as the patient is on the Abilify LAI -Dc Remeron 15 mg nightly for insomnia -Dc prazosin at 2 mg nightly for nightmares -Continue BuSpar 10 mg twice daily for anxiety   -Continue Ativan taper plus as needed Ativan for alcohol and benzodiazepine withdrawal -Continue clonidine taper and symptomatic treatment for any opioid withdrawal.  Also starting clonidine as needed for breakthrough opioid withdrawal symptoms   -Continue Protonix 40 mg daily for GERD -Continue Trazodone 50 mg nightly PRN for  sleep -Continue clonidine PRN for elevated BP with parameters     --  The risks/benefits/side-effects/alternatives to this medication were discussed in detail with the patient and time was given for questions. The patient consents to medication trial.                -- Metabolic profile and EKG monitoring obtained while on an atypical antipsychotic (BMI: Lipid Panel: HbgA1c: QTc:)              -- Encouraged patient to participate in unit milieu and in scheduled group therapies              -- Short Term Goals: Ability to identify changes  in lifestyle to reduce recurrence of condition will improve, Ability to verbalize feelings will improve, Ability to disclose and discuss suicidal ideas, Ability to demonstrate self-control will improve, Ability to identify and develop effective coping behaviors will improve, Ability to maintain clinical measurements within normal limits will improve, Compliance with prescribed medications will improve, and Ability to identify triggers associated with substance abuse/mental health issues will improve             -- Long Term Goals: Improvement in symptoms so as ready for discharge                3. Medical Issues Being Addressed:              Tobacco Use Disorder             -- Nicotine patch 21mg /24 hours ordered plus nicotine gum PRN              -- Smoking cessation encouraged   Order COVID and fluid testing for symptoms as above  4. Discharge Planning:              -- Social work and case management to assist with discharge planning and identification of hospital follow-up needs prior to discharge             -- Estimated LOS: 5-7 days             -- Discharge Concerns: Need to establish a safety plan; Medication compliance and effectiveness             -- Discharge Goals: Return home with outpatient referrals for mental health follow-up including medication management/psychotherapy    Starleen Blue, NP 04/09/2023, 4:45 PM   Total Time Spent in  Direct Patient Care:  I personally spent 35 minutes on the unit in direct patient care. The direct patient care time included face-to-face time with the patient, reviewing the patient's chart, communicating with other professionals, and coordinating care. Greater than 50% of this time was spent in counseling or coordinating care with the patient regarding goals of hospitalization, psycho-education, and discharge planning needs.   Patient ID: Cody Lockner., male   DOB: 08/29/1990, 32 y.o.   MRN: [id number]

## 2023-04-09 NOTE — Progress Notes (Signed)
   04/09/23 2015  Psych Admission Type (Psych Patients Only)  Admission Status Voluntary  Psychosocial Assessment  Patient Complaints Substance abuse  Eye Contact Fair  Facial Expression Anxious  Affect Anxious  Speech Logical/coherent  Interaction Assertive  Motor Activity Slow  Appearance/Hygiene Unremarkable  Behavior Characteristics Cooperative  Mood Depressed;Anxious  Aggressive Behavior  Effect No apparent injury  Thought Process  Coherency WDL  Content WDL  Delusions WDL  Perception Hallucinations  Hallucination Auditory;Visual  Judgment Impaired  Confusion WDL  Danger to Self  Current suicidal ideation? Denies  Danger to Others  Danger to Others None reported or observed

## 2023-04-10 MED ORDER — PRAZOSIN HCL 1 MG PO CAPS
1.0000 mg | ORAL_CAPSULE | Freq: Every day | ORAL | Status: DC
  Administered 2023-04-10 – 2023-04-12 (×3): 1 mg via ORAL
  Filled 2023-04-10 (×5): qty 1

## 2023-04-10 MED ORDER — MIRTAZAPINE 15 MG PO TABS
15.0000 mg | ORAL_TABLET | Freq: Every day | ORAL | Status: DC
  Administered 2023-04-10: 15 mg via ORAL
  Filled 2023-04-10 (×3): qty 1

## 2023-04-10 NOTE — Plan of Care (Signed)
  Problem: Health Behavior/Discharge Planning: Goal: Compliance with treatment plan for underlying cause of condition will improve Outcome: Progressing   Problem: Physical Regulation: Goal: Ability to maintain clinical measurements within normal limits will improve Outcome: Progressing

## 2023-04-10 NOTE — Group Note (Signed)
Date:  04/10/2023 Time:  2:31 PM  Group Topic/Focus:  Goals Group:   The focus of this group is to help patients establish daily goals to achieve during treatment and discuss how the patient can incorporate goal setting into their daily lives to aide in recovery.    Participation Level:  Minimal  Participation Quality:  Attentive  Affect:  Blunted  Cognitive:  Appropriate  Insight: Lacking  Engagement in Group:  Lacking  Modes of Intervention:  Discussion  Additional Comments:     Reymundo Poll 04/10/2023, 2:31 PM

## 2023-04-10 NOTE — Progress Notes (Signed)
   04/10/23 1000  Psych Admission Type (Psych Patients Only)  Admission Status Voluntary  Psychosocial Assessment  Patient Complaints Substance abuse;Sleep disturbance  Eye Contact Fair  Facial Expression Anxious  Affect Anxious  Speech Logical/coherent  Interaction Assertive  Motor Activity Slow  Appearance/Hygiene Unremarkable  Behavior Characteristics Cooperative;Appropriate to situation  Mood Depressed;Anxious  Aggressive Behavior  Effect No apparent injury  Thought Process  Coherency WDL  Content WDL  Delusions WDL  Perception WDL  Hallucination None reported or observed  Judgment Impaired  Confusion None  Danger to Self  Current suicidal ideation? Denies  Agreement Not to Harm Self Yes  Description of Agreement agreed to contact staff before acting on harmful thoughts  Danger to Others  Danger to Others None reported or observed

## 2023-04-10 NOTE — Progress Notes (Signed)
[facility] MD Progress Note  04/10/2023 2:54 PM Cody Young.  MRN:  [id number]  HPI:   Patient is a 32 year old male with a reported psychiatric history of bipolar disorder, and multiple substance use disorders, who was admitted to the psychiatric unit for evaluation of worsening depression, and also to manage substance use detoxification including alcohol, opiates, benzodiazepines, and stimulants. Overnight patient had no behavioral disturbances. Patient received the following PRNs: Trazodone 50mg  at bedtime, Robaxin 500mg  (2x), naproxen 500mg  once, Hydroxyzine 25mg  twice.   On assessment today patient reports that he is feeling anxious. Patient reports that some of this anxiety is related to his dispo plan, but he endorses that he is also feeling some irritability and believes that restarting his home meds may help. Patient reports that he did not sleep well last night and endorses that his nightmares related to PTSD are bothering him. Patient denies SI, HI and AVH. Patient reports that he he has not had the AVH in at least the last 2 days. Patient reports that he been having some dysphoric mood and reports that his appetite is also fairly low.     Tentative discharge date is on 10/21, patient asked that his ACT team be notified so that they can see him on that day.  As per CSW, his mother does not want him back home, unless he goes to long-term treatment, which patient is not open to going at this time.  Patient states that he would like to go to a shelter after discharge, and CSW is working on getting him placement in a shelter on his discharge date, closer to his home.  Principal Problem: Bipolar 1 disorder (HCC) Diagnosis: Principal Problem:   Bipolar 1 disorder (HCC) Active Problems:   Stimulant use disorder   Alcohol use disorder   Opioid use disorder   Tobacco use disorder   GAD (generalized anxiety disorder)  Total Time spent with patient: 25 minutes  Past Psychiatric History:   Bipolar disorder.  Multiple hospitalizations in the past, most recently in June 2024.  Reports multiple psychiatric medication trials, but does not recall them.  Reports the combination of Abilify, Remeron, Cymbalta, prazosin, BuSpar is mostly helpful for him.  It is unclear if the patient was also prescribed Depakote leading up to this admission, we will order a Depakote level. Patient reports a history of 2 suicide attempts in the past.     Past Medical History:  Past Medical History:  Diagnosis Date   Bipolar 1 disorder (HCC)     Past Surgical History:  Procedure Laterality Date   BRAIN SURGERY     Family History: History reviewed. No pertinent family history. Family Psychiatric  History: See H&P Social History:  Social History   Substance and Sexual Activity  Alcohol Use Yes   Alcohol/week: 12.0 standard drinks of alcohol   Types: 12 Cans of beer per week   Comment: Currently drinks mad dogs     Social History   Substance and Sexual Activity  Drug Use Yes   Types: Marijuana, Cocaine, "Crack" cocaine   Comment: deneis current cocaine use    Social History   Socioeconomic History   Marital status: Single    Spouse name: Not on file   Number of children: Not on file   Years of education: Not on file   Highest education level: Not on file  Occupational History   Not on file  Tobacco Use   Smoking status: Every Day    Current  packs/day: 1.00    Average packs/day: 1 pack/day for 20.0 years (20.0 ttl pk-yrs)    Types: Cigarettes   Smokeless tobacco: Never  Substance and Sexual Activity   Alcohol use: Yes    Alcohol/week: 12.0 standard drinks of alcohol    Types: 12 Cans of beer per week    Comment: Currently drinks mad dogs   Drug use: Yes    Types: Marijuana, Cocaine, "Crack" cocaine    Comment: deneis current cocaine use   Sexual activity: Yes    Birth control/protection: Condom  Other Topics Concern   Not on file  Social History Narrative   Not on file    Social Determinants of Health   Financial Resource Strain: Not on file  Food Insecurity: No Food Insecurity (04/05/2023)   Hunger Vital Sign    Worried About Running Out of Food in the Last Year: Never true    Ran Out of Food in the Last Year: Never true  Transportation Needs: No Transportation Needs (04/05/2023)   PRAPARE - Administrator, Civil Service (Medical): No    Lack of Transportation (Non-Medical): No  Physical Activity: Not on file  Stress: Not on file  Social Connections: Not on file  Current Medications: Current Facility-Administered Medications  Medication Dose Route Frequency Provider Last Rate Last Admin   acetaminophen (TYLENOL) tablet 650 mg  650 mg Oral Q6H PRN Jearld Lesch, NP   650 mg at 04/06/23 1809   alum & mag hydroxide-simeth (MAALOX/MYLANTA) 200-200-20 MG/5ML suspension 30 mL  30 mL Oral Q4H PRN Jearld Lesch, NP       busPIRone (BUSPAR) tablet 10 mg  10 mg Oral BID Starleen Blue, NP   10 mg at 04/10/23 1610   cloNIDine (CATAPRES) tablet 0.1 mg  0.1 mg Oral Q4H PRN Massengill, Harrold Donath, MD       cloNIDine (CATAPRES) tablet 0.1 mg  0.1 mg Oral Q4H PRN Massengill, Harrold Donath, MD       cloNIDine (CATAPRES) tablet 0.1 mg  0.1 mg Oral QAC breakfast Massengill, Harrold Donath, MD   0.1 mg at 04/10/23 9604   dicyclomine (BENTYL) tablet 20 mg  20 mg Oral Q6H PRN Phineas Inches, MD   20 mg at 04/08/23 5409   diphenhydrAMINE (BENADRYL) capsule 50 mg  50 mg Oral TID PRN Jearld Lesch, NP       Or   diphenhydrAMINE (BENADRYL) injection 50 mg  50 mg Intramuscular TID PRN Jearld Lesch, NP       haloperidol (HALDOL) tablet 5 mg  5 mg Oral TID PRN Jearld Lesch, NP       Or   haloperidol lactate (HALDOL) injection 5 mg  5 mg Intramuscular TID PRN Jearld Lesch, NP       hydrOXYzine (ATARAX) tablet 25 mg  25 mg Oral TID PRN Jearld Lesch, NP   25 mg at 04/09/23 2140   LORazepam (ATIVAN) tablet 2 mg  2 mg Oral TID PRN Jearld Lesch, NP       Or    LORazepam (ATIVAN) injection 2 mg  2 mg Intramuscular TID PRN Jearld Lesch, NP       magnesium hydroxide (MILK OF MAGNESIA) suspension 30 mL  30 mL Oral Daily PRN Jearld Lesch, NP       methocarbamol (ROBAXIN) tablet 500 mg  500 mg Oral Q8H PRN Massengill, Harrold Donath, MD   500 mg at 04/09/23 2140   mirtazapine (REMERON) tablet 15  mg  15 mg Oral QHS Eliseo Gum B, MD       multivitamin with minerals tablet 1 tablet  1 tablet Oral Daily Jearld Lesch, NP   1 tablet at 04/10/23 1242   naproxen (NAPROSYN) tablet 500 mg  500 mg Oral BID PRN Phineas Inches, MD   500 mg at 04/09/23 1002   nicotine (NICODERM CQ - dosed in mg/24 hours) patch 21 mg  21 mg Transdermal Daily Massengill, Harrold Donath, MD   21 mg at 04/10/23 0825   nicotine polacrilex (NICORETTE) gum 2 mg  2 mg Oral PRN Jearld Lesch, NP       ondansetron (ZOFRAN) tablet 4 mg  4 mg Oral Q8H PRN Dixon, Rashaun M, NP       pantoprazole (PROTONIX) EC tablet 40 mg  40 mg Oral Daily Massengill, Harrold Donath, MD   40 mg at 04/10/23 2956   prazosin (MINIPRESS) capsule 1 mg  1 mg Oral QHS Eliseo Gum B, MD       thiamine (Vitamin B-1) tablet 100 mg  100 mg Oral Daily Dixon, Rashaun M, NP   100 mg at 04/10/23 2130   traZODone (DESYREL) tablet 50 mg  50 mg Oral QHS PRN Phineas Inches, MD   50 mg at 04/09/23 2140    Lab Results:  No results found for this or any previous visit (from the past 48 hour(s)).   Blood Alcohol level:  Lab Results  Component Value Date   ETH <10 04/04/2023   ETH <10 12/19/2022    Metabolic Disorder Labs: Lab Results  Component Value Date   HGBA1C 5.5 04/06/2023   MPG 111.15 04/06/2023   MPG 103 09/02/2022   No results found for: "PROLACTIN" Lab Results  Component Value Date   CHOL 166 04/06/2023   TRIG 96 04/06/2023   HDL 34 (L) 04/06/2023   CHOLHDL 4.9 04/06/2023   VLDL 19 04/06/2023   LDLCALC 113 (H) 04/06/2023   LDLCALC 132 (H) 09/02/2022    Physical Findings: AIMS:  , ,  ,  ,    CIWA:   CIWA-Ar Total: 2 COWS:  COWS Total Score: 2    Psychiatric Specialty Exam:  Presentation  General Appearance:  Appropriate for Environment  Eye Contact: Good  Speech: Clear and Coherent  Speech Volume: Normal  Handedness: Right   Mood and Affect  Mood: Depressed; Anxious  Affect: Congruent; Depressed   Thought Process  Thought Processes: Coherent  Descriptions of Associations:Intact  Orientation:Full (Time, Place and Person)  Thought Content:Logical  History of Schizophrenia/Schizoaffective disorder:Yes  Duration of Psychotic Symptoms:Greater than six months  Hallucinations:Hallucinations: None    Ideas of Reference:None  Suicidal Thoughts:Suicidal Thoughts: No    Homicidal Thoughts:Homicidal Thoughts: No     Sensorium  Memory: Immediate Good; Recent Fair  Judgment: Fair  Insight: Fair   Art therapist  Concentration: Fair  Attention Span: Good  Recall: Good  Fund of Knowledge: Good  Language: Good   Psychomotor Activity  Psychomotor Activity: Psychomotor Activity: Normal     Assets  Assets: Desire for Improvement; Resilience   Sleep  Sleep: Sleep: Poor Number of Hours of Sleep: 9.75      Physical Exam: Physical Exam Vitals reviewed.  Constitutional:      General: He is not in acute distress.    Appearance: He is not toxic-appearing.  Pulmonary:     Effort: Pulmonary effort is normal. No respiratory distress.  Neurological:     Mental Status: He is alert.  Motor: No weakness.     Gait: Gait normal.    Review of Systems  Constitutional:  Negative for chills and fever.  Cardiovascular:  Negative for chest pain and palpitations.  Neurological:  Negative for dizziness, tingling, tremors and headaches.  Psychiatric/Behavioral:  Positive for depression and substance abuse. Negative for hallucinations, memory loss and suicidal ideas. The patient is nervous/anxious and has insomnia.   All  other systems reviewed and are negative.  Blood pressure 113/75, pulse 66, temperature 97.7 F (36.5 C), temperature source Oral, resp. rate 12, height 6\' 2"  (1.88 m), weight 123.6 kg, SpO2 99%. Body mass index is 34.97 kg/m.   Treatment Plan Summary:  Daily contact with patient to assess and evaluate symptoms and progress in treatment and Medication management   ASSESSMENT:   Diagnoses / Active Problems: Bipolar disorder, current episode is depressed GAD Stimulant use disorder Alcohol use disorder Sedative-hypnotic use disorder Opioid use disorder Tobacco use disorder   Will restart patient remeron at 15mg  and prazosin at 1mg  at bedtime. Patient BP have been normotensive, but patient is still on clonidine taper therefore will start Prazosin slow. Will start Remeron instead of cymbalta as patient feels it was most beneficial, last CMP noted patient's LFTS were downward trending.     PLAN: Safety and Monitoring:             --  Voluntary admission to inpatient psychiatric unit for safety, stabilization and treatment             -- Daily contact with patient to assess and evaluate symptoms and progress in treatment             -- Patient's case to be discussed in multi-disciplinary team meeting             -- Observation Level : q15 minute checks             -- Vital signs:  q12 hours             -- Precautions: suicide, elopement, and assault   2. Psychiatric Diagnoses and Treatment:               -Held Cymbalta due to elevated LFTs -Held Depakote due to elevated LFTs (pt not taking this PTA but restarted on admission by admitting provider) -Patient received Abilify LAI within the past 3 to 4 weeks.  We will need to confirm date of last dose, formulation and dose, and decide whether to resume this or not -On admission, we discontinued oral Abilify that was restarted on admission, as the patient is on the Abilify LAI -Restart Remeron 15 mg nightly for insomnia -Restart Prazosin  1mg  at bedtime for nightmares -Continue BuSpar 10 mg twice daily for anxiety   - Ativan taper plus as needed Ativan for alcohol and benzodiazepine withdrawal completed 10/18 -Continue clonidine taper and symptomatic treatment for any opioid withdrawal with clonidine as needed for breakthrough opioid withdrawal symptoms.  Will complete 10/21   -Continue Protonix 40 mg daily for GERD -Continue Trazodone 50 mg nightly PRN for sleep -Continue clonidine PRN for elevated BP with parameters     --  The risks/benefits/side-effects/alternatives to this medication were discussed in detail with the patient and time was given for questions. The patient consents to medication trial.                -- Metabolic profile and EKG monitoring obtained while on an atypical antipsychotic (BMI: Lipid Panel: HbgA1c: QTc:)              --  Encouraged patient to participate in unit milieu and in scheduled group therapies              -- Short Term Goals: Ability to identify changes in lifestyle to reduce recurrence of condition will improve, Ability to verbalize feelings will improve, Ability to disclose and discuss suicidal ideas, Ability to demonstrate self-control will improve, Ability to identify and develop effective coping behaviors will improve, Ability to maintain clinical measurements within normal limits will improve, Compliance with prescribed medications will improve, and Ability to identify triggers associated with substance abuse/mental health issues will improve             -- Long Term Goals: Improvement in symptoms so as ready for discharge                3. Medical Issues Being Addressed:              Tobacco Use Disorder             -- Nicotine patch 21mg /24 hours ordered plus nicotine gum PRN              -- Smoking cessation encouraged   Order COVID and fluid testing for symptoms as above  4. Discharge Planning:              -- Social work and case management to assist with discharge planning  and identification of hospital follow-up needs prior to discharge             -- Estimated LOS: 5-7 days             -- Discharge Concerns: Need to establish a safety plan; Medication compliance and effectiveness             -- Discharge Goals: Return home with outpatient referrals for mental health follow-up including medication management/psychotherapy    Bobbye Morton, MD 04/10/2023, 2:54 PM   Total Time Spent in Direct Patient Care:  I personally spent 35 minutes on the unit in direct patient care. The direct patient care time included face-to-face time with the patient, reviewing the patient's chart, communicating with other professionals, and coordinating care. Greater than 50% of this time was spent in counseling or coordinating care with the patient regarding goals of hospitalization, psycho-education, and discharge planning needs.   Patient ID: Cody Young., male   DOB: Feb 08, 1991, 32 y.o.   MRN: [id number]

## 2023-04-10 NOTE — [facility] Group Notes (Signed)
[facility] Group Notes:  (Nursing/MHT/Case Management/Adjunct)  Date:  04/10/2023  Time:  2:25 PM  Type of Therapy:  Psychoeducational Skills  Participation Level:  Active  Participation Quality:  Appropriate  Affect:  Appropriate  Cognitive:  Alert and Appropriate  Insight:  Appropriate  Engagement in Group:  Engaged  Modes of Intervention:  Discussion, Education, and Exploration  Summary of Progress/Problems:  Patients were educated on positive reframing, and the impacts it can have on mental well being. Pts were then given a podcast to listen to by '' [employer] with Berniece Pap '' to identify healthy behaviors and habits to impact mental health. Pt attended and was appropriate.   Cody Young 04/10/2023, 2:25 PM

## 2023-04-11 DIAGNOSIS — F319 Bipolar disorder, unspecified: Secondary | ICD-10-CM | POA: Diagnosis not present

## 2023-04-11 MED ORDER — MIRTAZAPINE 30 MG PO TABS
30.0000 mg | ORAL_TABLET | Freq: Every day | ORAL | Status: DC
  Administered 2023-04-11 – 2023-04-12 (×2): 30 mg via ORAL
  Filled 2023-04-11 (×5): qty 1

## 2023-04-11 NOTE — Plan of Care (Signed)
  Problem: Health Behavior/Discharge Planning: Goal: Compliance with treatment plan for underlying cause of condition will improve Outcome: Progressing   Problem: Physical Regulation: Goal: Ability to maintain clinical measurements within normal limits will improve Outcome: Progressing

## 2023-04-11 NOTE — [facility] Group Notes (Signed)
[facility] Group Notes:  (Nursing/MHT/Case Management/Adjunct)  Date:  04/11/2023  Time:  9:08 PM  Type of Therapy:  Psychoeducational Skills  Participation Level:  Active  Participation Quality:  Appropriate  Affect:  Depressed and Flat  Cognitive:  Appropriate  Insight:  Appropriate  Engagement in Group:  Engaged  Modes of Intervention:  Education  Summary of Progress/Problems: The patient rated his day as a 4 out of a possible 10 since he doesn't feel that he will be discharged tomorrow. He admits to having suicidal thoughts as a result of being told by his mother that he can't return home. He intends to speak with the social worker tomorrow about housing.   Susann Lawhorne S 04/11/2023, 9:08 PM

## 2023-04-11 NOTE — Progress Notes (Addendum)
D. Pt presented with an anxious affect- reported an improved mood this am upon approach, but continues to rate his anxiety high (9/10). Pt reported that he slept well, denied withdrawal symptoms, SI/HI and A/VH. Pt observed attending group this afternoon, but had to leave early due to feeling anxious and not being able to focus.  A. Labs and vitals monitored. Pt given and educated on medications. Pt supported emotionally and encouraged to express concerns and ask questions.   R. Pt remains safe with 15 minute checks. Will continue POC.    04/11/23 1000  Psych Admission Type (Psych Patients Only)  Admission Status Voluntary  Psychosocial Assessment  Patient Complaints Anxiety  Eye Contact Fair  Facial Expression Anxious  Affect Appropriate to circumstance  Speech Logical/coherent  Interaction Assertive  Motor Activity Slow  Appearance/Hygiene Unremarkable  Behavior Characteristics Appropriate to situation;Cooperative  Mood Depressed;Anxious  Aggressive Behavior  Effect No apparent injury  Thought Process  Coherency WDL  Content WDL  Delusions WDL  Perception WDL  Hallucination None reported or observed  Judgment Impaired  Confusion None  Danger to Self  Current suicidal ideation? Denies  Agreement Not to Harm Self Yes  Description of Agreement agreed to contact staff before acting on harmful thoughts  Danger to Others  Danger to Others None reported or observed

## 2023-04-11 NOTE — Plan of Care (Signed)
  Problem: Coping: Goal: Ability to verbalize frustrations and anger appropriately will improve Outcome: Progressing Goal: Ability to demonstrate self-control will improve Outcome: Progressing   

## 2023-04-11 NOTE — Group Note (Signed)
[facility] LCSW Group Therapy Note  04/11/2023  10:00-11:00AM  Type of Therapy and Topic:  Group Therapy:  Building Supports  Participation Level:  Did Not Attend   Description of Group:  Patients in this group were introduced to the idea of adding a variety of healthy supports to address the various needs in their lives.  Different types of support were defined and described, and each type was acted out.  Patients discussed what additional healthy supports could be helpful in their recovery and wellness after discharge in order to prevent future hospitalizations.   An emphasis was placed on following up with the discharge plan when they leave the hospital in order to continue becoming healthier and happier.  Two songs were played during group to help further patients' understanding.  Therapeutic Goals: 1)  demonstrate the importance of adding supports  2)  discuss 4 definitions of support  3)  identify the patient's current level of healthy support and   4)  elicit commitments to add one healthy support   Summary of Patient Progress:  Patient was invited to group, did not attend.   Therapeutic Modalities:   Psychoeducation Brief Solution-Focused Therapy  Lynnell Chad, LCSW 04/11/2023 11:30 AM

## 2023-04-11 NOTE — BH Specialist Note (Signed)
Pt did not attend goals group. 

## 2023-04-11 NOTE — Progress Notes (Signed)
Patient reported that he spoke with his mother who told him that his ACT team wasn't going to help him and that he couldn't go home to her. Pt stated that his conversation with his mother caused him to "spiral", causing him to feel suicidal again. Reassured pt that he was established with the ACT team, and that they would be following him. Pt did express some relief, and agreed to contact staff before acting on harmful thoughts.

## 2023-04-11 NOTE — Progress Notes (Signed)
   04/11/23 0529  15 Minute Checks  Location Bedroom  Visual Appearance Calm  Behavior Sleeping  Sleep (Behavioral Health Patients Only)  Calculate sleep? (Click Yes once per 24 hr at 0600 safety check) Yes  Documented sleep last 24 hours 10

## 2023-04-11 NOTE — Progress Notes (Signed)
[facility] MD Progress Note  04/11/2023 4:14 PM Colin Broach.  MRN:  [id number]  HPI:   Patient is a 32 year old male with a reported psychiatric history of bipolar disorder, and multiple substance use disorders, who was admitted to the psychiatric unit for evaluation of worsening depression, and also to manage substance use detoxification including alcohol, opiates, benzodiazepines, and stimulants. Overnight patient had no behavioral disturbances. Patient received the following PRNs: Trazodone 50mg  at bedtime, Robaxin 500mg  (2x), naproxen 500mg  once, Hydroxyzine 25mg  twice.   24-hour chart review: Patient is remaining compliant with medications, slept through the night as per nursing documentation and reports.  He has a documented that patient slept a total of 10 hours last night, even though he states that he slept for 5.  He has however, been over reporting his symptoms.  Periodically coming out to the day room, but has not attending a group sessions today.  Today's assessment During today's encounter, patient reports that he would like to go to a rehab for 30 days, because his mother would like him to do that prior to returning to her home, states that he would like a rehab facility where he can smoke cigarettes.  He changes his mind when attending psychiatrist sees him, and states that he no longer wants to go to rehab, and would like to be discharged.  Rehab was offered at start of hospitalization, and patient has consistently over the course of this hospitalization declined.  Today, patient denies SI/HI/AVH, denies paranoia, and there is no evidence of delusional thinking.  Symptoms are currently stable for management outside of the hospital setting.  I will plan is to discharge patient tomorrow, 10/21. Patient would like for his ACT team to be notified so that they can see him after discharge tomorrow, and CSW is aware. As per CSW, his mother does not want him back home, unless he goes to  long-term treatment, which patient is not open to going at this time.  Patient states that he would like to go to a shelter after discharge.  Our plan, is therefore to discharge him to a shelter tomorrow.  We are increasing Remeron to 30 mg nightly as per patient's request, since he states that he did not sleep well last night. Leaving all other medications same, and he has been educated to f/u with his ACTT after discharge. He denies medication related side effects, reports an improved appetite, also reports an improved energy level, states it is stable currently, states that he is moving his bowels well, denies any other concerns.  Principal Problem: Bipolar 1 disorder (HCC) Diagnosis: Principal Problem:   Bipolar 1 disorder (HCC) Active Problems:   Stimulant use disorder   Alcohol use disorder   Opioid use disorder   Tobacco use disorder   GAD (generalized anxiety disorder)  Total Time spent with patient: 45 minutes  Past Psychiatric History:  Bipolar disorder.  Multiple hospitalizations in the past, most recently in June 2024.  Reports multiple psychiatric medication trials, but does not recall them.  Reports the combination of Abilify, Remeron, Cymbalta, prazosin, BuSpar is mostly helpful for him.  It is unclear if the patient was also prescribed Depakote leading up to this admission, we will order a Depakote level. Patient reports a history of 2 suicide attempts in the past.     Past Medical History:  Past Medical History:  Diagnosis Date   Bipolar 1 disorder [facility])     Past Surgical History:  Procedure Laterality Date  BRAIN SURGERY     Family History: History reviewed. No pertinent family history. Family Psychiatric  History: See H&P Social History:  Social History   Substance and Sexual Activity  Alcohol Use Yes   Alcohol/week: 12.0 standard drinks of alcohol   Types: 12 Cans of beer per week   Comment: Currently drinks mad dogs     Social History   Substance and  Sexual Activity  Drug Use Yes   Types: Marijuana, Cocaine, "Crack" cocaine   Comment: deneis current cocaine use    Social History   Socioeconomic History   Marital status: Single    Spouse name: Not on file   Number of children: Not on file   Years of education: Not on file   Highest education level: Not on file  Occupational History   Not on file  Tobacco Use   Smoking status: Every Day    Current packs/day: 1.00    Average packs/day: 1 pack/day for 20.0 years (20.0 ttl pk-yrs)    Types: Cigarettes   Smokeless tobacco: Never  Substance and Sexual Activity   Alcohol use: Yes    Alcohol/week: 12.0 standard drinks of alcohol    Types: 12 Cans of beer per week    Comment: Currently drinks mad dogs   Drug use: Yes    Types: Marijuana, Cocaine, "Crack" cocaine    Comment: deneis current cocaine use   Sexual activity: Yes    Birth control/protection: Condom  Other Topics Concern   Not on file  Social History Narrative   Not on file   Social Determinants of Health   Financial Resource Strain: Not on file  Food Insecurity: No Food Insecurity (04/05/2023)   Hunger Vital Sign    Worried About Running Out of Food in the Last Year: Never true    Ran Out of Food in the Last Year: Never true  Transportation Needs: No Transportation Needs (04/05/2023)   PRAPARE - Administrator, Civil Service (Medical): No    Lack of Transportation (Non-Medical): No  Physical Activity: Not on file  Stress: Not on file  Social Connections: Not on file  Current Medications: Current Facility-Administered Medications  Medication Dose Route Frequency Provider Last Rate Last Admin   acetaminophen (TYLENOL) tablet 650 mg  650 mg Oral Q6H PRN Jearld Lesch, NP   650 mg at 04/11/23 0618   alum & mag hydroxide-simeth (MAALOX/MYLANTA) 200-200-20 MG/5ML suspension 30 mL  30 mL Oral Q4H PRN Jearld Lesch, NP       busPIRone (BUSPAR) tablet 10 mg  10 mg Oral BID Starleen Blue, NP   10 mg  at 04/11/23 6295   cloNIDine (CATAPRES) tablet 0.1 mg  0.1 mg Oral Q4H PRN Massengill, Harrold Donath, MD       cloNIDine (CATAPRES) tablet 0.1 mg  0.1 mg Oral Q4H PRN Massengill, Harrold Donath, MD       diphenhydrAMINE (BENADRYL) capsule 50 mg  50 mg Oral TID PRN Jearld Lesch, NP       Or   diphenhydrAMINE (BENADRYL) injection 50 mg  50 mg Intramuscular TID PRN Jearld Lesch, NP       haloperidol (HALDOL) tablet 5 mg  5 mg Oral TID PRN Jearld Lesch, NP       Or   haloperidol lactate (HALDOL) injection 5 mg  5 mg Intramuscular TID PRN Jearld Lesch, NP       hydrOXYzine (ATARAX) tablet 25 mg  25 mg Oral TID  PRN Jearld Lesch, NP   25 mg at 04/11/23 1326   LORazepam (ATIVAN) tablet 2 mg  2 mg Oral TID PRN Jearld Lesch, NP       Or   LORazepam (ATIVAN) injection 2 mg  2 mg Intramuscular TID PRN Jearld Lesch, NP       magnesium hydroxide (MILK OF MAGNESIA) suspension 30 mL  30 mL Oral Daily PRN Jearld Lesch, NP       mirtazapine (REMERON) tablet 15 mg  15 mg Oral QHS Eliseo Gum B, MD   15 mg at 04/10/23 2050   multivitamin with minerals tablet 1 tablet  1 tablet Oral Daily Jearld Lesch, NP   1 tablet at 04/11/23 1610   nicotine (NICODERM CQ - dosed in mg/24 hours) patch 21 mg  21 mg Transdermal Daily Massengill, Harrold Donath, MD   21 mg at 04/11/23 9604   nicotine polacrilex (NICORETTE) gum 2 mg  2 mg Oral PRN Jearld Lesch, NP       ondansetron (ZOFRAN) tablet 4 mg  4 mg Oral Q8H PRN Dixon, Rashaun M, NP       pantoprazole (PROTONIX) EC tablet 40 mg  40 mg Oral Daily Massengill, Harrold Donath, MD   40 mg at 04/11/23 0835   prazosin (MINIPRESS) capsule 1 mg  1 mg Oral QHS Eliseo Gum B, MD   1 mg at 04/10/23 2050   thiamine (Vitamin B-1) tablet 100 mg  100 mg Oral Daily Lerry Liner M, NP   100 mg at 04/11/23 0836   traZODone (DESYREL) tablet 50 mg  50 mg Oral QHS PRN Phineas Inches, MD   50 mg at 04/10/23 2050    Lab Results:  No results found for this or any previous visit  (from the past 48 hour(s)).   Blood Alcohol level:  Lab Results  Component Value Date   ETH <10 04/04/2023   ETH <10 12/19/2022    Metabolic Disorder Labs: Lab Results  Component Value Date   HGBA1C 5.5 04/06/2023   MPG 111.15 04/06/2023   MPG 103 09/02/2022   No results found for: "PROLACTIN" Lab Results  Component Value Date   CHOL 166 04/06/2023   TRIG 96 04/06/2023   HDL 34 (L) 04/06/2023   CHOLHDL 4.9 04/06/2023   VLDL 19 04/06/2023   LDLCALC 113 (H) 04/06/2023   LDLCALC 132 (H) 09/02/2022    Physical Findings: AIMS:  , ,  ,  ,    CIWA:  CIWA-Ar Total: 3 COWS:  COWS Total Score: 2    Psychiatric Specialty Exam:  Presentation  General Appearance:  Fairly Groomed  Eye Contact: Fair  Speech: Clear and Coherent  Speech Volume: Normal  Handedness: Right   Mood and Affect  Mood: Anxious  Affect: Congruent   Thought Process  Thought Processes: Coherent  Descriptions of Associations:Intact  Orientation:Full (Time, Place and Person)  Thought Content:Logical  History of Schizophrenia/Schizoaffective disorder:No  Duration of Psychotic Symptoms:N/A  Hallucinations:Hallucinations: None    Ideas of Reference:None  Suicidal Thoughts:Suicidal Thoughts: No    Homicidal Thoughts:Homicidal Thoughts: No     Sensorium  Memory: Recent Fair  Judgment: Fair  Insight: Fair   Art therapist  Concentration: Good  Attention Span: Good  Recall: Fair  Fund of Knowledge: Fair  Language: Good   Psychomotor Activity  Psychomotor Activity: Psychomotor Activity: Normal     Assets  Assets: Resilience   Sleep  Sleep: Sleep: Good Number of Hours of Sleep: 9.75  Physical Exam: Physical Exam Vitals reviewed.  Constitutional:      General: He is not in acute distress.    Appearance: He is not toxic-appearing.  Pulmonary:     Effort: Pulmonary effort is normal. No respiratory distress.   Neurological:     Mental Status: He is alert.     Motor: No weakness.     Gait: Gait normal.    Review of Systems  Constitutional:  Negative for chills and fever.  Cardiovascular:  Negative for chest pain and palpitations.  Neurological:  Negative for dizziness, tingling, tremors and headaches.  Psychiatric/Behavioral:  Positive for depression and substance abuse. Negative for hallucinations, memory loss and suicidal ideas. The patient is nervous/anxious and has insomnia.   All other systems reviewed and are negative.  Blood pressure 126/85, pulse 60, temperature 98.1 F (36.7 C), temperature source Oral, resp. rate 20, height 6\' 2"  (1.88 m), weight 123.6 kg, SpO2 100%. Body mass index is 34.97 kg/m.   Treatment Plan Summary:  Daily contact with patient to assess and evaluate symptoms and progress in treatment and Medication management   ASSESSMENT:   Diagnoses / Active Problems: Bipolar disorder, current episode is depressed GAD Stimulant use disorder Alcohol use disorder Sedative-hypnotic use disorder Opioid use disorder Tobacco use disorder   PLAN: Safety and Monitoring:             --  Voluntary admission to inpatient psychiatric unit for safety, stabilization and treatment             -- Daily contact with patient to assess and evaluate symptoms and progress in treatment             -- Patient's case to be discussed in multi-disciplinary team meeting             -- Observation Level : q15 minute checks             -- Vital signs:  q12 hours             -- Precautions: suicide, elopement, and assault   2. Psychiatric Diagnoses and Treatment:               -Held Cymbalta due to elevated LFTs -Held Depakote due to elevated LFTs (pt not taking this PTA but restarted on admission by admitting provider) -Patient received Abilify LAI within the past 3 to 4 weeks.  We will need to confirm date of last dose, formulation and dose, and decide whether to resume this or  not -On admission, we discontinued oral Abilify that was restarted on admission, as the patient is on the Abilify LAI -Increase Remeron  from 15 to 30 mg nightly for insomnia -Continue Prazosin 1mg  at bedtime for nightmares -Continue BuSpar 10 mg twice daily for anxiety   - Ativan taper plus as needed Ativan for alcohol and benzodiazepine withdrawal completed 10/18 -Continue clonidine taper and symptomatic treatment for any opioid withdrawal with clonidine as needed for breakthrough opioid withdrawal symptoms.  Will complete 10/21   -Continue Protonix 40 mg daily for GERD -Continue Trazodone 50 mg nightly PRN for sleep -Continue clonidine PRN for elevated BP with parameters     --  The risks/benefits/side-effects/alternatives to this medication were discussed in detail with the patient and time was given for questions. The patient consents to medication trial.                -- Metabolic profile and EKG monitoring obtained while on an atypical antipsychotic (BMI: Lipid  Panel: HbgA1c: QTc:)              -- Encouraged patient to participate in unit milieu and in scheduled group therapies              -- Short Term Goals: Ability to identify changes in lifestyle to reduce recurrence of condition will improve, Ability to verbalize feelings will improve, Ability to disclose and discuss suicidal ideas, Ability to demonstrate self-control will improve, Ability to identify and develop effective coping behaviors will improve, Ability to maintain clinical measurements within normal limits will improve, Compliance with prescribed medications will improve, and Ability to identify triggers associated with substance abuse/mental health issues will improve             -- Long Term Goals: Improvement in symptoms so as ready for discharge                3. Medical Issues Being Addressed:              Tobacco Use Disorder             -- Nicotine patch 21mg /24 hours ordered plus nicotine gum PRN              --  Smoking cessation encouraged   Order COVID and fluid testing for symptoms as above  4. Discharge Planning:              -- Social work and case management to assist with discharge planning and identification of hospital follow-up needs prior to discharge             -- Estimated LOS: 5-7 days             -- Discharge Concerns: Need to establish a safety plan; Medication compliance and effectiveness             -- Discharge Goals: Return home with outpatient referrals for mental health follow-up including medication management/psychotherapy    Starleen Blue, NP 04/11/2023, 4:14 PM   Total Time Spent in Direct Patient Care:  I personally spent 45 minutes on the unit in direct patient care. The direct patient care time included face-to-face time with the patient, reviewing the patient's chart, communicating with other professionals, and coordinating care. Greater than 50% of this time was spent in counseling or coordinating care with the patient regarding goals of hospitalization, psycho-education, and discharge planning needs.   Patient ID: Toben Letizia., male   DOB: 23-Oct-1990, 32 y.o.   MRN: [id number] Patient ID: Khalis Graig., male   DOB: 1991-04-12, 32 y.o.   MRN: [id number]

## 2023-04-11 NOTE — [facility] Group Notes (Signed)
[facility] Group Notes:  (Nursing/MHT/Case Management/Adjunct)  Date:  04/11/2023  Time:  3:07 PM  Type of Therapy:  Psychoeducational Skills  Participation Level:  Active  Participation Quality:  Appropriate  Affect:  Appropriate  Cognitive:  Appropriate  Insight:  Appropriate  Engagement in Group:  Engaged  Modes of Intervention:  Discussion, Education, and Exploration  Summary of Progress/Problems: Education was given on positive mindset and how this impacts mental health. Patients were given positive affirmation quotes and then a podcast was played which discussed the impact of how the brain reacts during stress by Dr. Dahlia Client (neuroscientist.) The patient attended and was appropriate but left group early.   Cody Young 04/11/2023, 3:07 PM

## 2023-04-11 NOTE — Progress Notes (Signed)
   04/11/23 2229  Psych Admission Type (Psych Patients Only)  Admission Status Voluntary  Psychosocial Assessment  Patient Complaints Anxiety  Eye Contact Fair  Facial Expression Anxious  Affect Appropriate to circumstance  Speech Logical/coherent  Interaction Assertive  Motor Activity Slow  Appearance/Hygiene Unremarkable  Behavior Characteristics Appropriate to situation  Mood Depressed;Anxious  Thought Process  Coherency WDL  Content WDL  Delusions None reported or observed  Perception WDL  Hallucination None reported or observed  Judgment Impaired  Confusion None  Danger to Self  Current suicidal ideation? Denies  Agreement Not to Harm Self Yes  Description of Agreement verbal  Danger to Others  Danger to Others None reported or observed

## 2023-04-12 ENCOUNTER — Encounter (HOSPITAL_COMMUNITY): Payer: Self-pay

## 2023-04-12 DIAGNOSIS — F319 Bipolar disorder, unspecified: Secondary | ICD-10-CM | POA: Diagnosis not present

## 2023-04-12 MED ORDER — BUSPIRONE HCL 10 MG PO TABS
10.0000 mg | ORAL_TABLET | Freq: Three times a day (TID) | ORAL | Status: DC
  Administered 2023-04-12 – 2023-04-13 (×2): 10 mg via ORAL
  Filled 2023-04-12 (×8): qty 1

## 2023-04-12 NOTE — Progress Notes (Signed)
   04/12/23 2353  Psych Admission Type (Psych Patients Only)  Admission Status Voluntary  Psychosocial Assessment  Patient Complaints Anxiety  Eye Contact Fair  Facial Expression Anxious  Affect Appropriate to circumstance  Speech Logical/coherent  Interaction Assertive  Motor Activity Slow  Appearance/Hygiene Unremarkable  Behavior Characteristics Cooperative;Appropriate to situation  Mood Depressed;Anxious  Thought Process  Coherency WDL  Content WDL  Delusions None reported or observed  Perception WDL  Hallucination None reported or observed  Judgment Impaired  Confusion None  Danger to Self  Current suicidal ideation? Passive  Agreement Not to Harm Self Yes  Description of Agreement verbal  Danger to Others  Danger to Others None reported or observed

## 2023-04-12 NOTE — Group Note (Signed)
Date:  04/12/2023 Time:  11:10 PM  Group Topic/Focus:  Wrap-Up Group:   The focus of this group is to help patients review their daily goal of treatment and discuss progress on daily workbooks.    Participation Level:  Active  Participation Quality:  Appropriate and Sharing  Affect:  Appropriate  Cognitive:  Appropriate  Insight: Appropriate and Improving  Engagement in Group:  Engaged and Improving  Modes of Intervention:  Activity and Socialization  Additional Comments:  The patient did not share much with the writer during group; patient was very limited. The patient stated that he had a "good day" and that he had a "good nights rest the night before". The patient share that he is being discharge on today. Patient did not share any specific goal or plan with Clinical research associate. The patient rated his day a 8/10. The patient participated in the group activity at the end of the group.   Cody Young 04/12/2023, 11:10 PM

## 2023-04-12 NOTE — Progress Notes (Signed)
   04/12/23 0805  Psych Admission Type (Psych Patients Only)  Admission Status Voluntary  Psychosocial Assessment  Patient Complaints Anxiety;Depression  Eye Contact Fair  Facial Expression Anxious;Sad  Affect Appropriate to circumstance  Speech Logical/coherent  Interaction Assertive  Motor Activity Slow  Appearance/Hygiene Unremarkable  Behavior Characteristics Cooperative;Anxious  Mood Depressed;Anxious  Thought Process  Coherency WDL  Content WDL  Delusions None reported or observed  Perception Hallucinations  Hallucination Auditory;Visual  Judgment Impaired  Confusion None  Danger to Self  Current suicidal ideation? Passive  Agreement Not to Harm Self Yes  Description of Agreement Verbal  Danger to Others  Danger to Others None reported or observed

## 2023-04-12 NOTE — Progress Notes (Signed)
[facility] MD Progress Note  04/12/2023 3:34 PM Colin Broach.  MRN:  [id number]  HPI:   Patient is a 32 year old male with a reported psychiatric history of bipolar disorder, and multiple substance use disorders, who was admitted to the psychiatric unit for evaluation of worsening depression, and also to manage substance use detoxification including alcohol, opiates, benzodiazepines, and stimulants. Overnight patient had no behavioral disturbances. Patient received the following PRNs: Trazodone 50mg  at bedtime, Robaxin 500mg  (2x), naproxen 500mg  once, Hydroxyzine 25mg  twice.   24-hour chart review: BP today afternoon is 140/106.  Most likely related to anxiety, as his BP has not been running this high.  Asking nursing to recheck.  Patient continues to be compliant with scheduled medications, is sleeping through the night, required hydroxyzine 25 mg last night for anxiety, and has required clonidine 0.1 mg today afternoon for elevated DBP and Tylenol 650 mg for pain.  Sleeping through the night as per nursing documentation and reports.  Today's assessment During encounter today, patient denies SI, denies HI, denies AVH, and he denies paranoia. The pt reports that their mood is euthymic, improved since admission, and stable. Denies feeling down, depressed, or sad.  Reports that anxiety symptoms are at manageable level.  Sleep is stable. Appetite is stable.  Concentration is without complaint.  Energy level is adequate. Denies having any suicidal thoughts. Denies having any suicidal intent and plan.  Denies having any HI.  Denies having psychotic symptoms.   Denies having side effects to current psychiatric medications.  We are increasing Buspar from 10 mg BID to 10 mg TID for management of anxiety since pt is taking Hydroxyzine consistently. Continuing other meds as listed below.  It can be concluded at this time that patient is not ready to go to rehab, as he is giving requirements to his CSW  such as wanting to go only for 2 weeks, and only to those that will allow him to smoke cigarettes while there.  Patient seems to be wanting to go to a rehab just in an effort to fulfill his mother's request to go to one due to his likelihood of being homeless as his mother will not allow him back into her home unless he goes to rehab.  He has however verbalized to social work that he does not mind going to a shelter, and plan is to discharge him to a shelter tomorrow 10/22 as he is not vested in going to rehabilitation at this time.  Symptoms at this time as stable for management on an outpatient basis, and patient has been educated that there is a risk of relapse on substances of abuse if he does not go to rehab, and has verbalized understanding, and continues to say that he does not mind going to a shelter at this time.  Discussed discharge planning for tomorrow 10/22 if he does not acquire somewhere to go from the list provided to him by CSW (halfway houses).  As per CSW, [facility] will not accept patient as he is not a [employer] resident, and a referral has been placed to [facility] with no response yet. Pt is not agreeable to going to [facility] as this program is "too long". He was not open to rehab at start of hospitalization, and only became interested in rehab yesterday 10/20 as reality began to kick in that he is facing homelessness.  Patient has an ACT team who can follow him up after discharge.  Principal Problem: Bipolar 1 disorder (HCC) Diagnosis: Principal  Problem:   Bipolar 1 disorder (HCC) Active Problems:   Stimulant use disorder   Alcohol use disorder   Opioid use disorder   Tobacco use disorder   GAD (generalized anxiety disorder)  Total Time spent with patient: 45 minutes  Past Psychiatric History:  Bipolar disorder.  Multiple hospitalizations in the past, most recently in June 2024.  Reports multiple psychiatric medication trials, but does not recall them.  Reports the combination of  Abilify, Remeron, Cymbalta, prazosin, BuSpar is mostly helpful for him.  It is unclear if the patient was also prescribed Depakote leading up to this admission, we will order a Depakote level. Patient reports a history of 2 suicide attempts in the past.     Past Medical History:  Past Medical History:  Diagnosis Date   Bipolar 1 disorder (HCC)     Past Surgical History:  Procedure Laterality Date   BRAIN SURGERY     Family History: History reviewed. No pertinent family history. Family Psychiatric  History: See H&P Social History:  Social History   Substance and Sexual Activity  Alcohol Use Yes   Alcohol/week: 12.0 standard drinks of alcohol   Types: 12 Cans of beer per week   Comment: Currently drinks mad dogs     Social History   Substance and Sexual Activity  Drug Use Yes   Types: Marijuana, Cocaine, "Crack" cocaine   Comment: deneis current cocaine use    Social History   Socioeconomic History   Marital status: Single    Spouse name: Not on file   Number of children: Not on file   Years of education: Not on file   Highest education level: Not on file  Occupational History   Not on file  Tobacco Use   Smoking status: Every Day    Current packs/day: 1.00    Average packs/day: 1 pack/day for 20.0 years (20.0 ttl pk-yrs)    Types: Cigarettes   Smokeless tobacco: Never  Substance and Sexual Activity   Alcohol use: Yes    Alcohol/week: 12.0 standard drinks of alcohol    Types: 12 Cans of beer per week    Comment: Currently drinks mad dogs   Drug use: Yes    Types: Marijuana, Cocaine, "Crack" cocaine    Comment: deneis current cocaine use   Sexual activity: Yes    Birth control/protection: Condom  Other Topics Concern   Not on file  Social History Narrative   Not on file   Social Determinants of Health   Financial Resource Strain: Not on file  Food Insecurity: No Food Insecurity (04/05/2023)   Hunger Vital Sign    Worried About Running Out of Food in the  Last Year: Never true    Ran Out of Food in the Last Year: Never true  Transportation Needs: No Transportation Needs (04/05/2023)   PRAPARE - Administrator, Civil Service (Medical): No    Lack of Transportation (Non-Medical): No  Physical Activity: Not on file  Stress: Not on file  Social Connections: Not on file  Current Medications: Current Facility-Administered Medications  Medication Dose Route Frequency Provider Last Rate Last Admin   acetaminophen (TYLENOL) tablet 650 mg  650 mg Oral Q6H PRN Jearld Lesch, NP   650 mg at 04/11/23 1636   alum & mag hydroxide-simeth (MAALOX/MYLANTA) 200-200-20 MG/5ML suspension 30 mL  30 mL Oral Q4H PRN Jearld Lesch, NP       busPIRone (BUSPAR) tablet 10 mg  10 mg Oral BID  Starleen Blue, NP   10 mg at 04/12/23 0756   cloNIDine (CATAPRES) tablet 0.1 mg  0.1 mg Oral Q4H PRN Massengill, Harrold Donath, MD       cloNIDine (CATAPRES) tablet 0.1 mg  0.1 mg Oral Q4H PRN Phineas Inches, MD   0.1 mg at 04/12/23 1206   diphenhydrAMINE (BENADRYL) capsule 50 mg  50 mg Oral TID PRN Jearld Lesch, NP       Or   diphenhydrAMINE (BENADRYL) injection 50 mg  50 mg Intramuscular TID PRN Jearld Lesch, NP       haloperidol (HALDOL) tablet 5 mg  5 mg Oral TID PRN Jearld Lesch, NP       Or   haloperidol lactate (HALDOL) injection 5 mg  5 mg Intramuscular TID PRN Jearld Lesch, NP       hydrOXYzine (ATARAX) tablet 25 mg  25 mg Oral TID PRN Jearld Lesch, NP   25 mg at 04/12/23 1206   LORazepam (ATIVAN) tablet 2 mg  2 mg Oral TID PRN Jearld Lesch, NP       Or   LORazepam (ATIVAN) injection 2 mg  2 mg Intramuscular TID PRN Jearld Lesch, NP       magnesium hydroxide (MILK OF MAGNESIA) suspension 30 mL  30 mL Oral Daily PRN Jearld Lesch, NP       mirtazapine (REMERON) tablet 30 mg  30 mg Oral QHS Starleen Blue, NP   30 mg at 04/11/23 2101   multivitamin with minerals tablet 1 tablet  1 tablet Oral Daily Jearld Lesch, NP   1  tablet at 04/12/23 0756   nicotine (NICODERM CQ - dosed in mg/24 hours) patch 21 mg  21 mg Transdermal Daily Massengill, Harrold Donath, MD   21 mg at 04/12/23 0756   nicotine polacrilex (NICORETTE) gum 2 mg  2 mg Oral PRN Jearld Lesch, NP       ondansetron (ZOFRAN) tablet 4 mg  4 mg Oral Q8H PRN Dixon, Rashaun M, NP       pantoprazole (PROTONIX) EC tablet 40 mg  40 mg Oral Daily Massengill, Harrold Donath, MD   40 mg at 04/12/23 0756   prazosin (MINIPRESS) capsule 1 mg  1 mg Oral QHS Eliseo Gum B, MD   1 mg at 04/11/23 2101   thiamine (Vitamin B-1) tablet 100 mg  100 mg Oral Daily Lerry Liner M, NP   100 mg at 04/12/23 0756   traZODone (DESYREL) tablet 50 mg  50 mg Oral QHS PRN Phineas Inches, MD   50 mg at 04/10/23 2050    Lab Results:  No results found for this or any previous visit (from the past 48 hour(s)).   Blood Alcohol level:  Lab Results  Component Value Date   ETH <10 04/04/2023   ETH <10 12/19/2022    Metabolic Disorder Labs: Lab Results  Component Value Date   HGBA1C 5.5 04/06/2023   MPG 111.15 04/06/2023   MPG 103 09/02/2022   No results found for: "PROLACTIN" Lab Results  Component Value Date   CHOL 166 04/06/2023   TRIG 96 04/06/2023   HDL 34 (L) 04/06/2023   CHOLHDL 4.9 04/06/2023   VLDL 19 04/06/2023   LDLCALC 113 (H) 04/06/2023   LDLCALC 132 (H) 09/02/2022    Physical Findings: AIMS:  , ,  ,  ,    CIWA:  CIWA-Ar Total: 1 COWS:  COWS Total Score: 2    Psychiatric Specialty Exam:  Presentation  General Appearance:  Disheveled  Eye Contact: Fair  Speech: Clear and Coherent  Speech Volume: Normal  Handedness: Right   Mood and Affect  Mood: Euthymic  Affect: Congruent   Thought Process  Thought Processes: Coherent  Descriptions of Associations:Intact  Orientation:Full (Time, Place and Person)  Thought Content:Logical  History of Schizophrenia/Schizoaffective disorder:No  Duration of Psychotic  Symptoms:N/A  Hallucinations:Hallucinations: None    Ideas of Reference:None  Suicidal Thoughts:Suicidal Thoughts: No    Homicidal Thoughts:Homicidal Thoughts: No     Sensorium  Memory: Recent Fair  Judgment: Fair  Insight: Fair   Art therapist  Concentration: Good  Attention Span: Good  Recall: Fair  Fund of Knowledge: Fair  Language: Good   Psychomotor Activity  Psychomotor Activity: Psychomotor Activity: Normal     Assets  Assets: Desire for Improvement   Sleep  Sleep: Sleep: Good      Physical Exam: Physical Exam Vitals reviewed.  Constitutional:      General: He is not in acute distress.    Appearance: He is not toxic-appearing.  Pulmonary:     Effort: Pulmonary effort is normal. No respiratory distress.  Neurological:     Mental Status: He is alert.     Motor: No weakness.     Gait: Gait normal.    Review of Systems  Constitutional:  Negative for chills and fever.  Cardiovascular:  Negative for chest pain and palpitations.  Neurological:  Negative for dizziness, tingling, tremors and headaches.  Psychiatric/Behavioral:  Positive for depression and substance abuse. Negative for hallucinations, memory loss and suicidal ideas. The patient is nervous/anxious and has insomnia.   All other systems reviewed and are negative.  Blood pressure (!) 140/106, pulse 71, temperature (!) 97.5 F (36.4 C), temperature source Oral, resp. rate 20, height 6\' 2"  (1.88 m), weight 123.6 kg, SpO2 99%. Body mass index is 34.97 kg/m.   Treatment Plan Summary:  Daily contact with patient to assess and evaluate symptoms and progress in treatment and Medication management   ASSESSMENT:   Diagnoses / Active Problems: Bipolar disorder, current episode is depressed GAD Stimulant use disorder Alcohol use disorder Sedative-hypnotic use disorder Opioid use disorder Tobacco use disorder   PLAN: Safety and Monitoring:             --   Voluntary admission to inpatient psychiatric unit for safety, stabilization and treatment             -- Daily contact with patient to assess and evaluate symptoms and progress in treatment             -- Patient's case to be discussed in multi-disciplinary team meeting             -- Observation Level : q15 minute checks             -- Vital signs:  q12 hours             -- Precautions: suicide, elopement, and assault   2. Psychiatric Diagnoses and Treatment:               -Held Cymbalta due to elevated LFTs -Held Depakote due to elevated LFTs (pt not taking this PTA but restarted on admission by admitting provider) -Patient received Abilify LAI within the past 3 to 4 weeks.  We will need to confirm date of last dose, formulation and dose, and decide whether to resume this or not -On admission, we discontinued oral Abilify that was restarted on admission, as  the patient is on the Abilify LAI -Continue Remeron 30 mg nightly for insomnia -Continue Prazosin 1mg  at bedtime for nightmares -Increase BuSpar from 10 mg twice daily to TID for anxiety   - Ativan taper plus as needed Ativan for alcohol and benzodiazepine withdrawal completed 10/18 -Continue clonidine taper and symptomatic treatment for any opioid withdrawal with clonidine as needed for breakthrough opioid withdrawal symptoms.  Will complete 10/21   -Continue Protonix 40 mg daily for GERD -Continue Trazodone 50 mg nightly PRN for sleep -Continue clonidine PRN for elevated BP with parameters     --  The risks/benefits/side-effects/alternatives to this medication were discussed in detail with the patient and time was given for questions. The patient consents to medication trial.                -- Metabolic profile and EKG monitoring obtained while on an atypical antipsychotic (BMI: Lipid Panel: HbgA1c: QTc:)              -- Encouraged patient to participate in unit milieu and in scheduled group therapies              -- Short Term  Goals: Ability to identify changes in lifestyle to reduce recurrence of condition will improve, Ability to verbalize feelings will improve, Ability to disclose and discuss suicidal ideas, Ability to demonstrate self-control will improve, Ability to identify and develop effective coping behaviors will improve, Ability to maintain clinical measurements within normal limits will improve, Compliance with prescribed medications will improve, and Ability to identify triggers associated with substance abuse/mental health issues will improve             -- Long Term Goals: Improvement in symptoms so as ready for discharge                3. Medical Issues Being Addressed:              Tobacco Use Disorder             -- Nicotine patch 21mg /24 hours ordered plus nicotine gum PRN              -- Smoking cessation encouraged   Order COVID and fluid testing for symptoms as above  4. Discharge Planning:              -- Social work and case management to assist with discharge planning and identification of hospital follow-up needs prior to discharge             -- Estimated LOS: 5-7 days             -- Discharge Concerns: Need to establish a safety plan; Medication compliance and effectiveness             -- Discharge Goals: Return home with outpatient referrals for mental health follow-up including medication management/psychotherapy   Starleen Blue, NP 04/12/2023, 3:34 PM  Total Time Spent in Direct Patient Care:  I personally spent 45 minutes on the unit in direct patient care. The direct patient care time included face-to-face time with the patient, reviewing the patient's chart, communicating with other professionals, and coordinating care. Greater than 50% of this time was spent in counseling or coordinating care with the patient regarding goals of hospitalization, psycho-education, and discharge planning needs.   Patient ID: Sherrell Wasem., male   DOB: 1990-09-02, 32 y.o.   MRN: [id number]

## 2023-04-12 NOTE — Progress Notes (Signed)
   04/12/23 0534  15 Minute Checks  Location Bedroom  Visual Appearance Calm  Behavior Sleeping  Sleep (Behavioral Health Patients Only)  Calculate sleep? (Click Yes once per 24 hr at 0600 safety check) Yes  Documented sleep last 24 hours 8.75

## 2023-04-12 NOTE — Progress Notes (Addendum)
Talked with pt this morning. Pt is now "sort of" interested in getting treatment at d/c because he is unable to return home. [facility] referral being sent today and information about the program has been given to pt as well. CSW will continue to assist.   Pt aware that d/c is scheduled for tomorrow, pt does not mind being d/c to shelter if needed. Pt was also given [employer] for surrounding area with vacancies. Pt was advised to call around today and is agreeable to do so.  This afternoon pt states "I am only going to go to a treatment place If its 2 weeks long. Nothing longer. And I have to be able to smoke cigarettes." Pt did call an [employer] in [place] and has phone interview with them today at 3:00pm. Will continue to assist.

## 2023-04-12 NOTE — Progress Notes (Signed)
Per patient request, CSW reached out to Pts mom, Santino Schiefer [phone], to give an update about pt progress and potential d/c plan. CSW called and LVM.   Pt had interview for [facility] and according to patient "it was really weird and they told me to call back every hour for an update after the rest of the housemates talk and decide if I can come tomorrow." Pt will update CSW as he gets answers. CSW will continue to follow.

## 2023-04-12 NOTE — BH IP Treatment Plan (Signed)
Interdisciplinary Treatment and Diagnostic Plan Update  04/12/2023 Time of Session: 11:30AM Cody Young. MRN: [id number]  Principal Diagnosis: Bipolar 1 disorder (HCC)  Secondary Diagnoses: Principal Problem:   Bipolar 1 disorder (HCC) Active Problems:   Stimulant use disorder   Alcohol use disorder   Opioid use disorder   Tobacco use disorder   GAD (generalized anxiety disorder)   Current Medications:  Current Facility-Administered Medications  Medication Dose Route Frequency Provider Last Rate Last Admin   acetaminophen (TYLENOL) tablet 650 mg  650 mg Oral Q6H PRN Jearld Lesch, NP   650 mg at 04/11/23 1636   alum & mag hydroxide-simeth (MAALOX/MYLANTA) 200-200-20 MG/5ML suspension 30 mL  30 mL Oral Q4H PRN Jearld Lesch, NP       busPIRone (BUSPAR) tablet 10 mg  10 mg Oral BID Starleen Blue, NP   10 mg at 04/12/23 0756   cloNIDine (CATAPRES) tablet 0.1 mg  0.1 mg Oral Q4H PRN Massengill, Harrold Donath, MD       cloNIDine (CATAPRES) tablet 0.1 mg  0.1 mg Oral Q4H PRN Massengill, Harrold Donath, MD   0.1 mg at 04/12/23 1206   diphenhydrAMINE (BENADRYL) capsule 50 mg  50 mg Oral TID PRN Jearld Lesch, NP       Or   diphenhydrAMINE (BENADRYL) injection 50 mg  50 mg Intramuscular TID PRN Jearld Lesch, NP       haloperidol (HALDOL) tablet 5 mg  5 mg Oral TID PRN Jearld Lesch, NP       Or   haloperidol lactate (HALDOL) injection 5 mg  5 mg Intramuscular TID PRN Jearld Lesch, NP       hydrOXYzine (ATARAX) tablet 25 mg  25 mg Oral TID PRN Jearld Lesch, NP   25 mg at 04/12/23 1206   LORazepam (ATIVAN) tablet 2 mg  2 mg Oral TID PRN Jearld Lesch, NP       Or   LORazepam (ATIVAN) injection 2 mg  2 mg Intramuscular TID PRN Jearld Lesch, NP       magnesium hydroxide (MILK OF MAGNESIA) suspension 30 mL  30 mL Oral Daily PRN Durwin Nora, Rashaun M, NP       mirtazapine (REMERON) tablet 30 mg  30 mg Oral QHS Nkwenti, Tyler Aas, NP   30 mg at 04/11/23 2101   multivitamin with  minerals tablet 1 tablet  1 tablet Oral Daily Jearld Lesch, NP   1 tablet at 04/12/23 0756   nicotine (NICODERM CQ - dosed in mg/24 hours) patch 21 mg  21 mg Transdermal Daily Massengill, Harrold Donath, MD   21 mg at 04/12/23 0756   nicotine polacrilex (NICORETTE) gum 2 mg  2 mg Oral PRN Jearld Lesch, NP       ondansetron (ZOFRAN) tablet 4 mg  4 mg Oral Q8H PRN Dixon, Rashaun M, NP       pantoprazole (PROTONIX) EC tablet 40 mg  40 mg Oral Daily Massengill, Nathan, MD   40 mg at 04/12/23 0756   prazosin (MINIPRESS) capsule 1 mg  1 mg Oral QHS Eliseo Gum B, MD   1 mg at 04/11/23 2101   thiamine (Vitamin B-1) tablet 100 mg  100 mg Oral Daily Lerry Liner M, NP   100 mg at 04/12/23 0756   traZODone (DESYREL) tablet 50 mg  50 mg Oral QHS PRN Phineas Inches, MD   50 mg at 04/10/23 2050   PTA Medications: Medications Prior to Admission  Medication Sig Dispense Refill Last Dose   ARIPiprazole ER (ABILIFY MAINTENA) 400 MG PRSY prefilled syringe Inject 400 mg into the muscle every 30 (thirty) days.   03/18/2023   busPIRone (BUSPAR) 30 MG tablet Take 30 mg by mouth 2 (two) times daily.   04/01/2023   disulfiram (ANTABUSE) 250 MG tablet Take 250 mg by mouth daily.   04/01/2023   DULoxetine (CYMBALTA) 60 MG capsule Take 60 mg by mouth 2 (two) times daily.   04/01/2023   guanFACINE (INTUNIV) 1 MG TB24 ER tablet Take 1 mg by mouth daily.   04/01/2023   mirtazapine (REMERON) 30 MG tablet Take 30 mg by mouth at bedtime.   04/01/2023   prazosin (MINIPRESS) 2 MG capsule Take 2 mg by mouth 3 (three) times daily.   04/01/2023   valproic acid (DEPAKENE) 250 MG/5ML solution Take 1,000 mg by mouth 3 (three) times daily.   04/01/2023    Patient Stressors: Medication change or noncompliance   Substance abuse    Patient Strengths: Forensic psychologist fund of knowledge  Motivation for treatment/growth  Supportive family/friends   Treatment Modalities: Medication Management, Group therapy, Case  management,  1 to 1 session with clinician, Psychoeducation, Recreational therapy.   Physician Treatment Plan for Primary Diagnosis: Bipolar 1 disorder (HCC) Long Term Goal(s): Improvement in symptoms so as ready for discharge   Short Term Goals: Ability to identify changes in lifestyle to reduce recurrence of condition will improve Ability to verbalize feelings will improve Ability to disclose and discuss suicidal ideas Ability to demonstrate self-control will improve Ability to identify and develop effective coping behaviors will improve Ability to maintain clinical measurements within normal limits will improve Compliance with prescribed medications will improve Ability to identify triggers associated with substance abuse/mental health issues will improve  Medication Management: Evaluate patient's response, side effects, and tolerance of medication regimen.  Therapeutic Interventions: 1 to 1 sessions, Unit Group sessions and Medication administration.  Evaluation of Outcomes: Progressing  Physician Treatment Plan for Secondary Diagnosis: Principal Problem:   Bipolar 1 disorder (HCC) Active Problems:   Stimulant use disorder   Alcohol use disorder   Opioid use disorder   Tobacco use disorder   GAD (generalized anxiety disorder)  Long Term Goal(s): Improvement in symptoms so as ready for discharge   Short Term Goals: Ability to identify changes in lifestyle to reduce recurrence of condition will improve Ability to verbalize feelings will improve Ability to disclose and discuss suicidal ideas Ability to demonstrate self-control will improve Ability to identify and develop effective coping behaviors will improve Ability to maintain clinical measurements within normal limits will improve Compliance with prescribed medications will improve Ability to identify triggers associated with substance abuse/mental health issues will improve     Medication Management: Evaluate patient's  response, side effects, and tolerance of medication regimen.  Therapeutic Interventions: 1 to 1 sessions, Unit Group sessions and Medication administration.  Evaluation of Outcomes: Progressing   RN Treatment Plan for Primary Diagnosis: Bipolar 1 disorder (HCC) Long Term Goal(s): Knowledge of disease and therapeutic regimen to maintain health will improve  Short Term Goals: Ability to remain free from injury will improve, Ability to verbalize frustration and anger appropriately will improve, Ability to participate in decision making will improve, Ability to verbalize feelings will improve, Ability to identify and develop effective coping behaviors will improve, and Compliance with prescribed medications will improve  Medication Management: RN will administer medications as ordered by provider, will assess and evaluate patient's response  and provide education to patient for prescribed medication. RN will report any adverse and/or side effects to prescribing provider.  Therapeutic Interventions: 1 on 1 counseling sessions, Psychoeducation, Medication administration, Evaluate responses to treatment, Monitor vital signs and CBGs as ordered, Perform/monitor CIWA, COWS, AIMS and Fall Risk screenings as ordered, Perform wound care treatments as ordered.  Evaluation of Outcomes: Progressing   LCSW Treatment Plan for Primary Diagnosis: Bipolar 1 disorder (HCC) Long Term Goal(s): Safe transition to appropriate next level of care at discharge, Engage patient in therapeutic group addressing interpersonal concerns.  Short Term Goals: Engage patient in aftercare planning with referrals and resources, Increase social support, Increase emotional regulation, Facilitate acceptance of mental health diagnosis and concerns, Identify triggers associated with mental health/substance abuse issues, and Increase skills for wellness and recovery  Therapeutic Interventions: Assess for all discharge needs, 1 to 1 time  with Social worker, Explore available resources and support systems, Assess for adequacy in community support network, Educate family and significant other(s) on suicide prevention, Complete Psychosocial Assessment, Interpersonal group therapy.  Evaluation of Outcomes: Progressing   Progress in Treatment: Attending groups: No. Participating in groups: No. Taking medication as prescribed: Yes. Toleration medication: Yes. Family/Significant other contact made: Yes, contacted:  Jedidiah, [place] [phone]   Patient understands diagnosis: Yes. Discussing patient identified problems/goals with staff: Yes. Medical problems stabilized or resolved: Yes. Denies suicidal/homicidal ideation: Yes. Issues/concerns per patient self-inventory: No.   New problem(s) identified: No, Describe:  None reported   New Short Term/Long Term Goal(s): detox, medication management for mood stabilization; elimination of SI thoughts; development of comprehensive mental wellness/sobriety plan   Patient Goals:  "I want to stay sober, stay calm, and stop having panic attacks"   Discharge Plan or Barriers: Patient recently admitted. CSW will continue to follow and assess for appropriate referrals and possible discharge planning.      Reason for Continuation of Hospitalization: Depression Medication stabilization Suicidal ideation Withdrawal symptoms   Estimated Length of Stay: 1-2 days  Last 3 [place] Suicide Severity Risk Score: Flowsheet Row Admission (Current) [facility] from 04/04/2023 in [facility] at [facility] ED from 02/25/2023 in [facility] Emergency Department at [facility]  C-SSRS RISK CATEGORY No Risk No Risk No Risk       Last PHQ 2/9 Scores:     No data to display          Scribe for Treatment Team: Kathi Der, LCSWA 04/12/2023 2:35 PM

## 2023-04-12 NOTE — Plan of Care (Signed)
  Problem: Education: Goal: Emotional status will improve Outcome: Progressing   Problem: Education: Goal: Mental status will improve Outcome: Progressing   Problem: Activity: Goal: Interest or engagement in activities will improve Outcome: Progressing Goal: Sleeping patterns will improve Outcome: Progressing   Problem: Safety: Goal: Periods of time without injury will increase Outcome: Progressing

## 2023-04-12 NOTE — Plan of Care (Signed)
  Problem: Education: Goal: Verbalization of understanding the information provided will improve Outcome: Progressing   Problem: Coping: Goal: Ability to verbalize frustrations and anger appropriately will improve Outcome: Progressing

## 2023-04-12 NOTE — [facility] Group Notes (Signed)

## 2023-04-12 NOTE — Group Note (Signed)
Recreation Therapy Group Note   Group Topic:Stress Management  Group Date: 04/12/2023 Start Time: 0935 End Time: 1000 Facilitators: Amyla Heffner-McCall, LRT,CTRS Location: [address] Dayroom   Group Topic: Stress Management   Goal Area(s) Addresses:  Patient will actively participate in stress management techniques presented during session.  Patient will successfully identify benefit of practicing stress management post d/c.   Intervention: Relaxation exercise with ambient sound and script   Group Description: Guided Imagery. LRT provided education, instruction, and demonstration on practice of visualization via guided imagery. Patient was asked to participate in the technique introduced during session. LRT debriefed including topics of mindfulness, stress management and specific scenarios each patient could use these techniques. Patients were given suggestions of ways to access scripts post d/c and encouraged to explore Youtube and other apps available on smartphones, tablets, and computers.  Education:  Stress Management, Discharge Planning.   Education Outcome: Acknowledges education   Affect/Mood: Appropriate   Participation Level: Moderate   Participation Quality: Independent   Behavior: Attentive    Speech/Thought Process: Barely audible    Insight: Fair   Judgement: Fair    Modes of Intervention: Script   Patient Response to Interventions:  Receptive   Education Outcome:  In group clarification offered    Clinical Observations/Individualized Feedback: Pt was quiet and attentive. Pt was called out of group and did not return.     Plan: Continue to engage patient in RT group sessions 2-3x/week.   Walterine Amodei-McCall, LRT,CTRS  04/12/2023 1:08 PM

## 2023-04-13 ENCOUNTER — Encounter: Payer: Self-pay | Admitting: Emergency Medicine

## 2023-04-13 ENCOUNTER — Emergency Department
Admission: EM | Admit: 2023-04-13 | Discharge: 2023-04-14 | Disposition: A | Discharge status: Other Acute Inpatient Hospital | Payer: MEDICAID | Attending: Emergency Medicine | Admitting: Emergency Medicine

## 2023-04-13 DIAGNOSIS — F29 Unspecified psychosis not due to a substance or known physiological condition: Secondary | ICD-10-CM | POA: Insufficient documentation

## 2023-04-13 DIAGNOSIS — F17200 Nicotine dependence, unspecified, uncomplicated: Secondary | ICD-10-CM | POA: Insufficient documentation

## 2023-04-13 DIAGNOSIS — F1190 Opioid use, unspecified, uncomplicated: Secondary | ICD-10-CM | POA: Diagnosis not present

## 2023-04-13 DIAGNOSIS — F411 Generalized anxiety disorder: Secondary | ICD-10-CM | POA: Diagnosis not present

## 2023-04-13 DIAGNOSIS — R45851 Suicidal ideations: Secondary | ICD-10-CM | POA: Diagnosis not present

## 2023-04-13 DIAGNOSIS — Z79899 Other long term (current) drug therapy: Secondary | ICD-10-CM | POA: Insufficient documentation

## 2023-04-13 DIAGNOSIS — F1090 Alcohol use, unspecified, uncomplicated: Secondary | ICD-10-CM | POA: Diagnosis not present

## 2023-04-13 DIAGNOSIS — F319 Bipolar disorder, unspecified: Secondary | ICD-10-CM | POA: Insufficient documentation

## 2023-04-13 DIAGNOSIS — F1590 Other stimulant use, unspecified, uncomplicated: Secondary | ICD-10-CM | POA: Diagnosis not present

## 2023-04-13 DIAGNOSIS — R456 Violent behavior: Secondary | ICD-10-CM | POA: Diagnosis present

## 2023-04-13 HISTORY — DX: Emotional lability: R45.86

## 2023-04-13 HISTORY — DX: Essential (primary) hypertension: I10

## 2023-04-13 LAB — SALICYLATE LEVEL: Salicylate Lvl: <7 mg/dL — ABNORMAL LOW (ref 7.0–30.0)

## 2023-04-13 LAB — COMPREHENSIVE METABOLIC PANEL
ALT: 36 U/L (ref 0–44)
AST: 24 U/L (ref 15–41)
Albumin: 4.8 g/dL (ref 3.5–5.0)
Alkaline Phosphatase: 50 U/L (ref 38–126)
Anion gap: 13 (ref 5–15)
BUN: 18 mg/dL (ref 6–20)
CO2: 24 mmol/L (ref 22–32)
Calcium: 9.7 mg/dL (ref 8.9–10.3)
Chloride: 101 mmol/L (ref 98–111)
Creatinine, Ser: 1.03 mg/dL (ref 0.61–1.24)
GFR, Estimated: >60 mL/min (ref >60)
Glucose, Bld: 108 mg/dL — ABNORMAL HIGH (ref 70–99)
Potassium: 3.7 mmol/L (ref 3.5–5.1)
Sodium: 138 mmol/L (ref 135–145)
Total Bilirubin: 0.9 mg/dL (ref 0.3–1.2)
Total Protein: 7.8 g/dL (ref 6.5–8.1)

## 2023-04-13 LAB — URINE DRUG SCREEN, QUALITATIVE (ARMC ONLY)
Amphetamines, Ur Screen: NOT DETECTED
Barbiturates, Ur Screen: NOT DETECTED
Benzodiazepine, Ur Scrn: NOT DETECTED
Cannabinoid 50 Ng, Ur ~~LOC~~: POSITIVE — AB
Cocaine Metabolite,Ur ~~LOC~~: NOT DETECTED
MDMA (Ecstasy)Ur Screen: NOT DETECTED
Methadone Scn, Ur: NOT DETECTED
Opiate, Ur Screen: NOT DETECTED
Phencyclidine (PCP) Ur S: NOT DETECTED
Tricyclic, Ur Screen: NOT DETECTED

## 2023-04-13 LAB — ETHANOL: Alcohol, Ethyl (B): <10 mg/dL (ref <10)

## 2023-04-13 LAB — CBC
HCT: 47.5 % (ref 39.0–52.0)
Hemoglobin: 15.8 g/dL (ref 13.0–17.0)
MCH: 30.1 pg (ref 26.0–34.0)
MCHC: 33.3 g/dL (ref 30.0–36.0)
MCV: 90.5 fL (ref 80.0–100.0)
Platelets: 244 10*3/uL (ref 150–400)
RBC: 5.25 MIL/uL (ref 4.22–5.81)
RDW: 13 % (ref 11.5–15.5)
WBC: 11.4 10*3/uL — ABNORMAL HIGH (ref 4.0–10.5)
nRBC: 0 % (ref 0.0–0.2)

## 2023-04-13 LAB — ACETAMINOPHEN LEVEL: Acetaminophen (Tylenol), Serum: <10 ug/mL — ABNORMAL LOW (ref 10–30)

## 2023-04-13 MED ORDER — NICOTINE 21 MG/24HR TD PT24: 21.0000 mg | MEDICATED_PATCH | Freq: Every day | TRANSDERMAL | 0 refills | Status: DC

## 2023-04-13 MED ORDER — TRAZODONE HCL 50 MG PO TABS: 50.0000 mg | ORAL_TABLET | Freq: Every evening | ORAL | 0 refills | Status: DC | PRN

## 2023-04-13 MED ORDER — HYDROXYZINE HCL 25 MG PO TABS: 25.0000 mg | ORAL_TABLET | Freq: Three times a day (TID) | ORAL | 0 refills | Status: DC | PRN

## 2023-04-13 MED ORDER — PANTOPRAZOLE SODIUM 40 MG PO TBEC: 40.0000 mg | DELAYED_RELEASE_TABLET | Freq: Every day | ORAL | 0 refills | Status: DC

## 2023-04-13 MED ORDER — BUSPIRONE HCL 10 MG PO TABS: 10.0000 mg | ORAL_TABLET | Freq: Three times a day (TID) | ORAL | 0 refills | Status: DC

## 2023-04-13 MED ORDER — MIRTAZAPINE 30 MG PO TABS: 30.0000 mg | ORAL_TABLET | Freq: Every day | ORAL | 0 refills | Status: DC

## 2023-04-13 MED ORDER — ADULT MULTIVITAMIN W/MINERALS CH: 1.0000 | ORAL_TABLET | Freq: Every day | ORAL | Status: DC

## 2023-04-13 MED ORDER — PRAZOSIN HCL 1 MG PO CAPS: 1.0000 mg | ORAL_CAPSULE | Freq: Every day | ORAL | 0 refills | Status: DC

## 2023-04-13 NOTE — Discharge Summary (Signed)
Physician Discharge Summary Note  Patient:  Cody Young is an 32 y.o., male MRN:  [id number] DOB:  [birth year] Patient phone:  [phone] (home)  Patient address:   [address] [place] [address],  Total Time spent with patient: 45 minutes  Date of Admission:  04/05/2023 Date of Discharge: 04/13/2023  Reason for Admission:   Patient is a 32 year old male with a reported psychiatric history of bipolar disorder, and multiple substance use disorders, who was admitted to the psychiatric unit for evaluation of worsening depression, and also to manage substance use detoxification  On presentation to the [facility] on 10/13, "Pt sts that he been on a alcohol and drug binge for the last four days. Pt sts that his drug of choice is cocaine. Pt sts that he is not SI or HI. Pt sts that he has not taken his medication as directed due to the fact the the drugs and alcohol are working better for him." Renea Ee, Leonides Sake, RN, Date of Service: 04/04/2023  1:43 PM).   Pt was transferred to this [facility] on 10/14 for treatment and stabilization of his mental status.  Principal Problem: Bipolar 1 disorder (HCC) Discharge Diagnoses: Principal Problem:   Bipolar 1 disorder (HCC) Active Problems:   Stimulant use disorder   Alcohol use disorder   Opioid use disorder   Tobacco use disorder   GAD (generalized anxiety disorder)  Past Psychiatric History: See H & P  Past Medical History:  Past Medical History:  Diagnosis Date   Bipolar 1 disorder (HCC)     Past Surgical History:  Procedure Laterality Date   BRAIN SURGERY     Family History: History reviewed. No pertinent family history. Family Psychiatric  History: See H & P Social History:  Social History   Substance and Sexual Activity  Alcohol Use Yes   Alcohol/week: 12.0 standard drinks of alcohol   Types: 12 Cans of beer per week   Comment: Currently drinks mad dogs     Social History   Substance and Sexual Activity   Drug Use Yes   Types: Marijuana, Cocaine, "Crack" cocaine   Comment: deneis current cocaine use    Social History   Socioeconomic History   Marital status: Single    Spouse name: Not on file   Number of children: Not on file   Years of education: Not on file   Highest education level: Not on file  Occupational History   Not on file  Tobacco Use   Smoking status: Every Day    Current packs/day: 1.00    Average packs/day: 1 pack/day for 20.0 years (20.0 ttl pk-yrs)    Types: Cigarettes   Smokeless tobacco: Never  Substance and Sexual Activity   Alcohol use: Yes    Alcohol/week: 12.0 standard drinks of alcohol    Types: 12 Cans of beer per week    Comment: Currently drinks mad dogs   Drug use: Yes    Types: Marijuana, Cocaine, "Crack" cocaine    Comment: deneis current cocaine use   Sexual activity: Yes    Birth control/protection: Condom  Other Topics Concern   Not on file  Social History Narrative   Not on file   Social Determinants of Health   Financial Resource Strain: Not on file  Food Insecurity: No Food Insecurity (04/05/2023)   Hunger Vital Sign    Worried About Running Out of Food in the Last Year: Never true    Ran Out of Food  in the Last Year: Never true  Transportation Needs: No Transportation Needs (04/05/2023)   PRAPARE - Administrator, Civil Service (Medical): No    Lack of Transportation (Non-Medical): No  Physical Activity: Not on file  Stress: Not on file  Social Connections: Not on file   Hospital Course:    During the patient's hospitalization, patient had extensive initial psychiatric evaluation, and follow-up psychiatric evaluations every day. Psychiatric diagnoses provided upon initial assessment: As above. Patient's psychiatric medications were adjusted on admission: As follows: -Hold Cymbalta due to elevated LFTs -Hold Depakote due to elevated LFTs -Patient received Abilify LAI within the past 3 to 4 weeks.  We were unable to  confirm date of last dose, formulation and dose, and did not resume this medication. Pt has been educated to f/u with his ACT team to determine restart of Abilify Maintaina after discharge. -Discontinue oral Abilify that was restarted on admission -Restart Remeron 15 mg nightly for insomnia -Restart prazosin at 2 mg nightly for nightmares -Hold BuSpar for now  -Start Ativan taper plus as needed Ativan for alcohol and benzodiazepine withdrawal -Start clonidine taper and symptomatic treatment for any opioid withdrawal.  Also starting clonidine as needed for breakthrough opioid withdrawal symptoms   During the hospitalization, other adjustments were made to the patient's psychiatric medication regimen: Pt successfully completed the Ativan and Clonidine tapers for the alcohol and the Opioid use disorders respectively. Medications at discharge are as follows:   -Continue Remeron 30 mg nightly for insomnia -Continue Prazosin 1mg  at bedtime for nightmares -Contin BuSpar 10 mg tid daily to TID for anxiety -Continue Protonix 40 mg daily for GERD -Continue Trazodone 50 mg nightly PRN for sleep -Continue Nicotine patch every 24 hrs for nicotine dependence -Continue Multivitamin daily for nutritional supplementation -Continue Hydroxyzine 25 mg TID PRN for anxiety   Patient's care was discussed during the interdisciplinary team meeting every day during the hospitalization. The patient denies having side effects to prescribed psychiatric medication. Gradually, patient started adjusting to milieu. The patient was evaluated each day by a clinical provider to ascertain response to treatment. Improvement was noted by the patient's report of decreasing symptoms, improved sleep and appetite, affect, medication tolerance, behavior, and participation in unit programming.  Patient was asked each day to complete a self inventory noting mood, mental status, pain, new symptoms, anxiety and concerns.     Symptoms were  reported as significantly decreased or resolved completely by discharge. On day of discharge, the patient reports that their mood is stable. The patient denied having suicidal thoughts for more than 48 hours prior to discharge.  Patient denies having homicidal thoughts.  Patient denies having auditory hallucinations.  Patient denies any visual hallucinations or other symptoms of psychosis. The patient was motivated to continue taking medication with a goal of continued improvement in mental health.    The patient reports their target psychiatric symptoms of depression, substance withdrawal symptoms, insomnia, anxiety and depression responded well to the psychiatric medications, and the patient reports overall benefit from this psychiatric hospitalization. Supportive psychotherapy was provided to the patient. The patient also participated in regular group therapy while hospitalized. Coping skills, problem solving as well as relaxation therapies were also part of the unit programming.   Labs were reviewed with the patient, and abnormal results were discussed with the patient.   The patient is able to verbalize their individual safety plan to this provider.   # It is recommended to the patient to continue psychiatric medications  as prescribed, after discharge from the hospital.     # It is recommended to the patient to follow up with your outpatient psychiatric provider and PCP.   # It was discussed with the patient, the impact of alcohol, drugs, tobacco have been there overall psychiatric and medical wellbeing, and total abstinence from substance use was recommended the patient.ed.   # Prescriptions provided or sent directly to preferred pharmacy at discharge. Patient agreeable to plan. Given opportunity to ask questions. Appears to feel comfortable with discharge.    # In the event of worsening symptoms, the patient is instructed to call the crisis hotline, 911 and or go to the nearest ED for  appropriate evaluation and treatment of symptoms. To follow-up with primary care provider for other medical issues, concerns and or health care needs   # Patient was discharged to a shelter with a plan to follow up as noted below. Pt was offered the opportunity to go to substance abuse treatment, at start of his hospitalization, but declined.  A day prior to discharge, he stated that he would like to go to substance abuse treatment, but only on the condition that it was for 2 weeks, and that he will be allowed to smoke.  Referrals were placed, but patient was not accepted in any of the facilities.  Patient also not showing motivation for treatment at a long-term facility at this time, stated that he wants to do it only for 2 weeks because his mother wants him to go to rehab.  At this point, symptoms are stable for management on an outside basis outpatient.  Patient is denying SI/HI/AVH, depressive symptoms have significantly improved since hospitalization, and patient is no longer a danger to himself or to anyone else in the community.  He has been educated to follow up with his ACT team for continuity of care.   Total Time spent with patient: 45 minutes Physical Findings: AIMS:0 CIWA:  CIWA-Ar Total: 0 COWS:  COWS Total Score: 2  Musculoskeletal: Strength & Muscle Tone: within normal limits Gait & Station: normal Patient leans: N/A  Psychiatric Specialty Exam:  Presentation  General Appearance:  Appropriate for Environment; Fairly Groomed  Eye Contact: Fair  Speech: Clear and Coherent  Speech Volume: Normal  Handedness: Right   Mood and Affect  Mood: Euthymic  Affect: Congruent   Thought Process  Thought Processes: Coherent  Descriptions of Associations:Intact  Orientation:Full (Time, Place and Person)  Thought Content:Logical  History of Schizophrenia/Schizoaffective disorder:No  Duration of Psychotic Symptoms:N/A  Hallucinations:Hallucinations: None  Ideas  of Reference:None  Suicidal Thoughts:Suicidal Thoughts: No  Homicidal Thoughts:Homicidal Thoughts: No   Sensorium  Memory: Immediate Good  Judgment: Good  Insight: Good   Executive Functions  Concentration: Good  Attention Span: Good  Recall: Good  Fund of Knowledge: Good  Language: Good   Psychomotor Activity  Psychomotor Activity: Psychomotor Activity: Normal   Assets  Assets: Resilience   Sleep  Sleep: Sleep: Good    Physical Exam: Physical Exam Review of Systems  Psychiatric/Behavioral:  Positive for depression (Significantly reduced. Denies SI/HI, denies intent or pla.n to harm self or others) and substance abuse (Educated on cessation). Negative for hallucinations, memory loss and suicidal ideas. The patient is nervous/anxious and has insomnia (Resolving).   All other systems reviewed and are negative.  Blood pressure (!) 126/91, pulse 90, temperature 97.7 F (36.5 C), temperature source Oral, resp. rate 20, height 6\' 2"  (1.88 m), weight 123.6 kg, SpO2 99%. Body mass index is  34.97 kg/m.   Social History   Tobacco Use  Smoking Status Every Day   Current packs/day: 1.00   Average packs/day: 1 pack/day for 20.0 years (20.0 ttl pk-yrs)   Types: Cigarettes  Smokeless Tobacco Never   Tobacco Cessation:  A prescription for an FDA-approved tobacco cessation medication provided at discharge   Blood Alcohol level:  Lab Results  Component Value Date   [facility] <10 04/04/2023   ETH <10 12/19/2022    Metabolic Disorder Labs:  Lab Results  Component Value Date   HGBA1C 5.5 04/06/2023   MPG 111.15 04/06/2023   MPG 103 09/02/2022   No results found for: "PROLACTIN" Lab Results  Component Value Date   CHOL 166 04/06/2023   TRIG 96 04/06/2023   HDL 34 (L) 04/06/2023   CHOLHDL 4.9 04/06/2023   VLDL 19 04/06/2023   LDLCALC 113 (H) 04/06/2023   LDLCALC 132 (H) 09/02/2022    See Psychiatric Specialty Exam and Suicide Risk Assessment  completed by Attending Physician prior to discharge.  Discharge destination:  Other:  Shelter   Is patient on multiple antipsychotic therapies at discharge:  No   Has Patient had three or more failed trials of antipsychotic monotherapy by history:  No  Recommended Plan for Multiple Antipsychotic Therapies: NA   Allergies as of 04/13/2023   No Known Allergies      Medication List     STOP taking these medications    disulfiram 250 MG tablet Commonly known as: ANTABUSE   DULoxetine 60 MG capsule Commonly known as: CYMBALTA   guanFACINE 1 MG Tb24 ER tablet Commonly known as: INTUNIV   nicotine 14 mg/24hr patch Commonly known as: NICODERM CQ - dosed in mg/24 hours Replaced by: nicotine 21 mg/24hr patch   valproic acid 250 MG/5ML solution Commonly known as: DEPAKENE       TAKE these medications      Indication  Abilify Maintena 400 MG Prsy prefilled syringe Generic drug: ARIPiprazole ER Inject 400 mg into the muscle every 30 (thirty) days.  Indication: Manic-Depression, to be continued by patient's ACTT for mood stabilization   busPIRone 10 MG tablet Commonly known as: BUSPAR Take 1 tablet (10 mg total) by mouth 3 (three) times daily. What changed:  medication strength how much to take when to take this  Indication: Major Depressive Disorder   hydrOXYzine 25 MG tablet Commonly known as: ATARAX Take 1 tablet (25 mg total) by mouth 3 (three) times daily as needed for anxiety.  Indication: Feeling Anxious   mirtazapine 30 MG tablet Commonly known as: REMERON Take 1 tablet (30 mg total) by mouth at bedtime.  Indication: Major Depressive Disorder   multivitamin with minerals Tabs tablet Take 1 tablet by mouth daily. Start taking on: April 14, 2023  Indication: Nutritional Support   nicotine 21 mg/24hr patch Commonly known as: NICODERM CQ - dosed in mg/24 hours Place 1 patch (21 mg total) onto the skin daily. Start taking on: April 14, 2023 Replaces: nicotine 14 mg/24hr patch  Indication: Nicotine Addiction   pantoprazole 40 MG tablet Commonly known as: PROTONIX Take 1 tablet (40 mg total) by mouth daily. Start taking on: April 14, 2023  Indication: Gastroesophageal Reflux Disease   prazosin 1 MG capsule Commonly known as: MINIPRESS Take 1 capsule (1 mg total) by mouth at bedtime. What changed:  medication strength how much to take when to take this  Indication: Disturbed Sleep   traZODone 50 MG tablet Commonly known as: DESYREL Take 1  tablet (50 mg total) by mouth at bedtime as needed for sleep. What changed:  medication strength how much to take  Indication: Trouble Sleeping        Follow-up Information     [facility]. Call on 04/14/2023.   Why: Please call this provider to continue your services with the ACT team for your medication management and therapy needs. I have called this provider numerous times and was unable to make contact. Contact information: Address: [address] [place], [place] [address]  Phone Number: [phone] Fax Number : [phone]        [facility] Call.   Specialty: Addiction Medicine Why: Please call this provider to check for bed availability since a referral has been  made on your behalf. Contact information: [address] [place] [address] [phone]                Signed: Starleen Blue, NP 04/13/2023, 3:08 PM

## 2023-04-13 NOTE — ED Triage Notes (Signed)
Pt with recent hospitalization at [facility] due to mental health and alcohol/cocaine abuse. Pt discharged today from [facility] and when called to return home, mother denied pts return. Pt sts he attempted to go to 2 different homeless shelters and was denied. Pt sts he continued to get more upset and had increased SI. Pt had plan and began to three times per week wrist to cut them for self harm but decided against and came to ED for assistance. Pt denies ETOH or drug abuse since release from [facility] today. Pt tearful, respectful, calm and cooperative during triage. Pt with hx/o Bipolar 1 disorder and mood effective/disturbance disorder.     ** Pt changed out with Clinical research associate and male Engineer, materials in triage 1 room. Pt removed personal belongings and applied hospital required mesh brief, paper scrub (top/bottom,) and non-slip socks. Pt belongings placed into labeled bag and taken to assigned RN assuming pt care for locker placement.**

## 2023-04-13 NOTE — Progress Notes (Signed)
Pt awake and alert x 4.   Flat affect but denies SI, HI or AVH.  Pt reports that he feels ready for discharged.  Pt was given all his prescriptions along with AVS and suicide safety plan.  Pt verbalized understanding of medications along with where to pick them up.  Pt given back all belongings from locker 22.  He was escorted to lobby and taxi voucher was given to driver.  Pt left without incident.

## 2023-04-13 NOTE — BHH Suicide Risk Assessment (Signed)
Suicide Risk Assessment  Discharge Assessment    [facility] Discharge Suicide Risk Assessment   Principal Problem: Bipolar 1 disorder [facility]) Discharge Diagnoses: Principal Problem:   Bipolar 1 disorder (HCC) Active Problems:   Stimulant use disorder   Alcohol use disorder   Opioid use disorder   Tobacco use disorder   GAD (generalized anxiety disorder)  Reason For Admission:  Patient is a 32 year old male with a reported psychiatric history of bipolar disorder, and multiple substance use disorders, who was admitted to the psychiatric unit for evaluation of worsening depression, and also to manage substance use detoxification  On presentation to the [facility] on 10/13, "Pt sts that he been on a alcohol and drug binge for the last four days. Pt sts that his drug of choice is cocaine. Pt sts that he is not SI or HI. Pt sts that he has not taken his medication as directed due to the fact the the drugs and alcohol are working better for him." Renea Ee, Leonides Sake, RN, Date of Service: 04/04/2023  1:43 PM).  Pt was transferred to this [facility] on 10/14 for treatment and stabilization of his mental status.  Hospital Course: During the patient's hospitalization, patient had extensive initial psychiatric evaluation, and follow-up psychiatric evaluations every day. Psychiatric diagnoses provided upon initial assessment: As above. Patient's psychiatric medications were adjusted on admission: As follows: -Hold Cymbalta due to elevated LFTs -Hold Depakote due to elevated LFTs -Patient received Abilify LAI within the past 3 to 4 weeks.  We were unable to confirm date of last dose, formulation and dose, and did not resume this medication. Pt has been educated to f/u with his ACT team to determine restart of Abilify Maintaina after discharge. -Discontinue oral Abilify that was restarted on admission -Restart Remeron 15 mg nightly for insomnia -Restart prazosin at 2 mg nightly for nightmares -Hold BuSpar for  now  -Start Ativan taper plus as needed Ativan for alcohol and benzodiazepine withdrawal -Start clonidine taper and symptomatic treatment for any opioid withdrawal.  Also starting clonidine as needed for breakthrough opioid withdrawal symptoms  During the hospitalization, other adjustments were made to the patient's psychiatric medication regimen: Pt successfully completed the Ativan and Clonidine tapers for the alcohol and the Opioid use disorders respectively. Medications at discharge are as follows:  -Continue Remeron 30 mg nightly for insomnia -Continue Prazosin 1mg  at bedtime for nightmares -Contin BuSpar 10 mg tid daily to TID for anxiety -Continue Protonix 40 mg daily for GERD -Continue Trazodone 50 mg nightly PRN for sleep -Continue Nicotine patch every 24 hrs for nicotine dependence -Continue Multivitamin daily for nutritional supplementation -Continue Hydroxyzine 25 mg TID PRN for anxiety  Patient's care was discussed during the interdisciplinary team meeting every day during the hospitalization. The patient denies having side effects to prescribed psychiatric medication. Gradually, patient started adjusting to milieu. The patient was evaluated each day by a clinical provider to ascertain response to treatment. Improvement was noted by the patient's report of decreasing symptoms, improved sleep and appetite, affect, medication tolerance, behavior, and participation in unit programming.  Patient was asked each day to complete a self inventory noting mood, mental status, pain, new symptoms, anxiety and concerns.    Symptoms were reported as significantly decreased or resolved completely by discharge. On day of discharge, the patient reports that their mood is stable. The patient denied having suicidal thoughts for more than 48 hours prior to discharge.  Patient denies having homicidal thoughts.  Patient denies having auditory hallucinations.  Patient denies any visual hallucinations or  other symptoms of psychosis. The patient was motivated to continue taking medication with a goal of continued improvement in mental health.   The patient reports their target psychiatric symptoms of depression, substance withdrawal symptoms, insomnia, anxiety and depression responded well to the psychiatric medications, and the patient reports overall benefit from this psychiatric hospitalization. Supportive psychotherapy was provided to the patient. The patient also participated in regular group therapy while hospitalized. Coping skills, problem solving as well as relaxation therapies were also part of the unit programming.  Labs were reviewed with the patient, and abnormal results were discussed with the patient.  The patient is able to verbalize their individual safety plan to this provider.  # It is recommended to the patient to continue psychiatric medications as prescribed, after discharge from the hospital.    # It is recommended to the patient to follow up with your outpatient psychiatric provider and PCP.  # It was discussed with the patient, the impact of alcohol, drugs, tobacco have been there overall psychiatric and medical wellbeing, and total abstinence from substance use was recommended the patient.ed.  # Prescriptions provided or sent directly to preferred pharmacy at discharge. Patient agreeable to plan. Given opportunity to ask questions. Appears to feel comfortable with discharge.    # In the event of worsening symptoms, the patient is instructed to call the crisis hotline, 911 and or go to the nearest ED for appropriate evaluation and treatment of symptoms. To follow-up with primary care provider for other medical issues, concerns and or health care needs  # Patient was discharged to a shelter with a plan to follow up as noted below. Pt was offered the opportunity to go to substance abuse treatment, at start of his hospitalization, but declined.  A day prior to discharge, he  stated that he would like to go to substance abuse treatment, but only on the condition that it was for 2 weeks, and that he will be allowed to smoke.  Referrals were placed, but patient was not accepted in any of the facilities.  Patient also not showing motivation for treatment at a long-term facility at this time, stated that he wants to do it only for 2 weeks because his mother wants him to go to rehab.  At this point, symptoms are stable for management on an outside basis outpatient.  Patient is denying SI/HI/AVH, depressive symptoms have significantly improved since hospitalization, and patient is no longer a danger to himself or to anyone else in the community.  He has been educated to follow up with his ACT team for continuity of care.  Total Time spent with patient: 45 minutes  Musculoskeletal: Strength & Muscle Tone: within normal limits Gait & Station: normal Patient leans: N/A  Psychiatric Specialty Exam  Presentation  General Appearance:  Appropriate for Environment; Fairly Groomed  Eye Contact: Fair  Speech: Clear and Coherent  Speech Volume: Normal  Handedness: Right   Mood and Affect  Mood: Euthymic  Duration of Depression Symptoms: Greater than two weeks  Affect: Congruent   Thought Process  Thought Processes: Coherent  Descriptions of Associations:Intact  Orientation:Full (Time, Place and Person)  Thought Content:Logical  History of Schizophrenia/Schizoaffective disorder:No  Duration of Psychotic Symptoms:N/A  Hallucinations:Hallucinations: None  Ideas of Reference:None  Suicidal Thoughts:Suicidal Thoughts: No  Homicidal Thoughts:Homicidal Thoughts: No   Sensorium  Memory: Immediate Good  Judgment: Good  Insight: Good   Executive Functions  Concentration: Good  Attention Span: Good  Recall: Good  Fund of Knowledge: Good  Language: Good   Psychomotor Activity  Psychomotor Activity: Psychomotor Activity:  Normal   Assets  Assets: Resilience   Sleep  Sleep: Sleep: Good   Physical Exam: Physical Exam Constitutional:      Appearance: Normal appearance.  HENT:     Head: Normocephalic.     Nose: Nose normal.  Eyes:     Pupils: Pupils are equal, round, and reactive to light.  Pulmonary:     Effort: Pulmonary effort is normal.     Breath sounds: Normal breath sounds.  Musculoskeletal:     Cervical back: Normal range of motion.  Neurological:     Mental Status: He is alert and oriented to person, place, and time.    Review of Systems  Constitutional: Negative.   HENT: Negative.    Eyes: Negative.   Respiratory: Negative.    Cardiovascular: Negative.   Gastrointestinal:  Negative for heartburn.  Genitourinary: Negative.   Musculoskeletal: Negative.   Skin: Negative.   Psychiatric/Behavioral:  Positive for depression (Denies SI/HI, denies plan or intent) and substance abuse (Educated on negative impact on his mental health). Negative for hallucinations, memory loss and suicidal ideas. The patient is nervous/anxious (Resolving) and has insomnia (Resolving).    Blood pressure (!) 126/91, pulse 90, temperature 97.7 F (36.5 C), temperature source Oral, resp. rate 20, height 6\' 2"  (1.88 m), weight 123.6 kg, SpO2 99%. Body mass index is 34.97 kg/m.  Mental Status Per Nursing Assessment::   On Admission:  Suicidal ideation indicated by others At discharge, denies SI/HI/AVH, denies plan or intent to harm self or others. Demographic Factors:  Male, Caucasian, Low socioeconomic status, and Unemployed  Loss Factors: Financial problems/change in socioeconomic status  Historical Factors: Prior suicide attempts  Risk Reduction Factors:   Positive social support  Continued Clinical Symptoms:  Alcohol/Substance Abuse/Dependencies  Cognitive Features That Contribute To Risk:  None    Suicide Risk:  Mild:  There are no identifiable suicide plans, no associated intent, mild  dysphoria and related symptoms, good self-control (both objective and subjective assessment), few other risk factors, and identifiable protective factors, including available and accessible social support.    Follow-up Information     [facility] Team. Call on 04/14/2023.   Why: Please call this provider to continue your services with the ACT team for your medication management and therapy needs. I have called this provider numerous times and was unable to make contact. Contact information: Address: [address] [place], [place] [address]  Phone Number: [phone] Fax Number : [phone]        [facility] Call.   Specialty: Addiction Medicine Why: Please call this provider to check for bed availability since a referral has been  made on your behalf. Contact information: [address] [place] [address] [phone]                Starleen Blue, NP 04/13/2023, 10:14 AM

## 2023-04-13 NOTE — Transportation (Signed)
04/13/2023  Cody Young. DOB: 1991-04-12 MRN: [id number]   RIDER WAIVER AND RELEASE OF LIABILITY  For the purposes of helping with transportation needs, [facility] partners with outside transportation providers (taxi companies, [place], Catering manager.) to give [employer] patients or other approved people the choice of on-demand rides [employer]") to our buildings for non-emergency visits.  By using [employer], I, the person signing this document, on behalf of myself and/or any legal minors (in my care using the [employer]), agree:  Science writer given to me are supplied by independent, outside transportation providers who do not work for, or have any affiliation with, [employer]. [facility] is not a transportation company. [facility] has no control over the quality or safety of the rides I get using [employer]. [facility] has no control over whether any outside ride will happen on time or not. [facility] gives no guarantee on the reliability, quality, safety, or availability on any rides, or that no mistakes will happen. I know and accept that traveling by vehicle (car, truck, SVU, Zenaida Niece, bus, taxi, etc.) has risks of serious injuries such as disability, being paralyzed, and death. I know and agree the risk of using [employer] is mine alone, and not [employer]. Transport Services are provided "as is" and as are available. The transportation providers are in charge for all inspections and care of the vehicles used to provide these rides. I agree not to take legal action against [facility] [place], its agents, employees, officers, directors, representatives, insurers, attorneys, assigns, successors, subsidiaries, and affiliates at any time for any reasons related directly or indirectly to using [employer]. I also agree not to take legal action against [facility] or its affiliates for any injury, death, or damage to property caused by or related to  using [employer]. I have read this Waiver and Release of Liability, and I understand the terms used in it and their legal meaning. This Waiver is freely and voluntarily given with the understanding that my right (or any legal minors) to legal action against [facility] [place] relating to [employer] is knowingly given up to use these services.   I attest that I read the Ride Waiver and Release of Liability to Cody Young., gave [name] the opportunity to ask questions and answered the questions asked (if any). I affirm that Cody Young. then provided consent for assistance with transportation.

## 2023-04-13 NOTE — BH Assessment (Signed)
Comprehensive Clinical Assessment (CCA) Screening, Triage and Referral Note  04/13/2023 Cody Young [id number] Recommendations for Services/Supports/Treatments: Psych consult/disposition pending. Cody Young. Cody Young. is a 32 y.o., Caucasian, Not Hispanic or Latino ethnicity, ENGLISH speaking male with a history of bipolar disorder, opioid use disorder, alcohol use disorder, tobacco use disorder, GAD, and Stimulant use disorder who presented to the ED voluntarily.  Per triage note: Pt with recent hospitalization at [facility] due to mental health and alcohol/cocaine abuse. Pt discharged today from [facility] and when called to return home, mother denied pts return. Pt sts he attempted to go to 2 different homeless shelters and was denied. Pt sts he continued to get more upset and had increased SI. Pt had plan and began to three times per week wrist to cut them for self-harm but decided against and came to ED for assistance. Pt denies ETOH or drug abuse since release from [facility] today. Pt tearful, respectful, calm and cooperative during triage. Pt with hx/o Bipolar 1 disorder and mood effective/disturbance disorder.   On assessment, the patient was cooperative. Pt presented with a depressed mood and a flat affect. Pt was engaged and continued to endorse SI with a plan to slit his wrist or hang himself with a string. Pt reported that he hears voices telling him to end his life. Pt identified his main stressor as being discharged from the hospital today, going to his mother's house, and being told that he cannot reside at his mother's house. Pt reported that discovering this triggered his suicidal ideation; however he came to the ED in order to prevent acting on his urges to self harm or use drugs. Pt expressed a desire to get his medications adjusted and to have a place to stay so that his mother will see him getting help and change her mind. Pt's speech was normal. Pt had poor insight and judgment. Pt was  oriented x4. Pt presented with relevant thought processes and normal psychomotor activity. Pt denied current HI/V/H.   Chief Complaint:  Chief Complaint  Patient presents with   Suicidal   Visit Diagnosis: Principal Problem: Bipolar 1 disorder (HCC) Discharge Diagnoses: Principal Problem:   Bipolar 1 disorder (HCC) Active Problems:   Stimulant use disorder   Alcohol use disorder   Opioid use disorder   Tobacco use disorder   GAD (generalized anxiety disorder)  Patient Reported Information How did you hear about [place]? Self  What Is the Reason for Your Visit/Call Today? Pt sts that he been on a alcohol and drug binge for the last four days. Pt sts that his drug of choice is cocaine. Pt sts that he is not SI or HI. Pt sts that he has not taken his medication as directed due to the fact the the drugs and alcohol are working better for him.  How Long Has This Been Causing You Problems? > than 6 months  What Do You Feel Would Help You the Most Today? Alcohol or Drug Use Treatment; Treatment for Depression or other mood problem   Have You Recently Had Any Thoughts About Hurting Yourself? Yes  Are You Planning to Commit Suicide/Harm Yourself At This time? Yes   Have you Recently Had Thoughts About Hurting Someone Cody Young? No  Are You Planning to Harm Someone at This Time? No  Explanation: n/a   Have You Used Any Alcohol or Drugs in the Past 24 Hours? Yes  How Long Ago Did You Use Drugs or Alcohol? No data recorded  What Did You Use and How Much? Cocaine and Alcohol   Do You Currently Have a Therapist/Psychiatrist? Yes  Name of Therapist/Psychiatrist: [facility] team   Have You Been Recently Discharged From Any Office Practice or Programs? No  Explanation of Discharge From Practice/Program: Pt reports his psychiatrist discharged him on 12/17/22, "I was not able to get my injection"    CCA Screening Triage Referral Assessment Type of Contact: Face-to-Face  Telemedicine  Service Delivery:   Is this Initial or Reassessment?   Date Telepsych consult ordered in [facility]:    Time Telepsych consult ordered in [facility]:    Location of Assessment: [facility] ED  Provider Location: [facility] ED    Collateral Involvement: No collateral involved   Does Patient Have a Court Appointed Legal Guardian? No data recorded Name and Contact of Legal Guardian: No data recorded If Minor and Not Living with Parent(s), Who has Custody? n/a  Is CPS involved or ever been involved? Never  Is APS involved or ever been involved? Never   Patient Determined To Be At Risk for Harm To Self or Others Based on Review of Patient Reported Information or Presenting Complaint? Yes, for Self-Harm  Method: Plan with intent and identified person (medication overdose)  Availability of Means: Has close by  Intent: Vague intent or NA  Notification Required: No need or identified person  Additional Information for Danger to Others Potential: -- (n/a)  Additional Comments for Danger to Others Potential: n/a  Are There Guns or Other Weapons in Your Home? No  Types of Guns/Weapons: No guns or weapons in his possession  Are These Weapons Safely Secured?                            No  Who Could Verify You Are Able To Have These Secured: n/a  Do You Have any Outstanding Charges, Pending Court Dates, Parole/Probation? No  Contacted To Inform of Risk of Harm To Self or Others: -- (n/a)   Does Patient Present under Involuntary Commitment? No    [place] of Residence: [place]   Patient Currently Receiving the Following Services: ACTT Psychologist, educational); Medication Management   Determination of Need: Emergent (2 hours)   Options For Referral: Inpatient Hospitalization   Discharge Disposition:     Johnetta Sloniker R Marsi Turvey, LCAS

## 2023-04-13 NOTE — ED Notes (Signed)
Patient back to recliner after speaking with TTS.

## 2023-04-13 NOTE — Progress Notes (Signed)
  [facility] Adult Case Management Discharge Plan :  Will you be returning to the same living situation after discharge:  No.Pt asked to be DC to the [facility]   At discharge, do you have transportation home?: Yes,  CSW arranged a Taxi to the shelter through [employer] for 11:15AM  Do you have the ability to pay for your medications: Yes,  [facility] Tailored Plan   Release of information consent forms completed and in the chart;  Patient's signature needed at discharge.  Patient to Follow up at:  Follow-up Information     [facility] Team. Call on 04/14/2023.   Why: Please call this provider to continue your services with the ACT team for your medication management and therapy needs. I have called this provider numerous times and was unable to make contact. Contact information: Address: [address] [place], [place] [address]  Phone Number: [phone] Fax Number : [phone]        [facility] Call.   Specialty: Addiction Medicine Why: Please call this provider to check for bed availability since a referral has been  made on your behalf. Contact information: [address] [place] [address] [phone]                 Next level of care provider has access to [facility] Link:no  Safety Planning and Suicide Prevention discussed: Yes,  Mom Nnaemeka Drum [phone]     Has patient been referred to the Quitline?: Patient refused referral for treatment  Patient has been referred for addiction treatment: Yes, referral information given but appointment not made based on patient wanting to just go to a shelter ; Patient has [facility] information, if he desires to follow up with them to check on bed availability, once he is released in community (list facility).  Isabella Bowens, LCSWA 04/13/2023, 10:02 AM

## 2023-04-13 NOTE — Plan of Care (Signed)
  Problem: Education: Goal: Knowledge of [facility] Education information/materials will improve Outcome: Progressing Goal: Emotional status will improve Outcome: Progressing Goal: Mental status will improve Outcome: Progressing Goal: Verbalization of understanding the information provided will improve Outcome: Progressing   Problem: Activity: Goal: Interest or engagement in activities will improve Outcome: Progressing   

## 2023-04-13 NOTE — ED Provider Notes (Signed)
[facility] Provider Note   Event Date/Time   First MD Initiated Contact with Patient 04/13/23 2129     (approximate) History  Suicidal  HPI Cody Young. is a 32 y.o. male with stated past medical history of depression who presents complaining of suicidal ideation with a plan to cut his wrists and hang himself.  Patient states that he has been taking his medications on time and as prescribed however he has been unable to get into a homeless shelter and that is caused him to be suicidal. ROS: Patient currently denies any vision changes, tinnitus, difficulty speaking, facial droop, sore throat, chest pain, shortness of breath, abdominal pain, nausea/vomiting/diarrhea, dysuria, or weakness/numbness/paresthesias in any extremity   Physical Exam  Triage Vital Signs: ED Triage Vitals  Encounter Vitals Group     BP 04/13/23 2045 (!) 143/104     Systolic BP Percentile --      Diastolic BP Percentile --      Pulse Rate 04/13/23 2045 99     Resp 04/13/23 2045 20     Temp 04/13/23 2045 97.9 F (36.6 C)     Temp Source 04/13/23 2045 Oral     SpO2 04/13/23 2045 99 %     Weight 04/13/23 2047 253 lb 8.5 oz (115 kg)     Height 04/13/23 2047 6\' 2"  (1.88 m)     Head Circumference --      Peak Flow --      Pain Score 04/13/23 2047 0     Pain Loc --      Pain Education --      Exclude from Growth Chart --    Most recent vital signs: Vitals:   04/13/23 2045  BP: (!) 143/104  Pulse: 99  Resp: 20  Temp: 97.9 F (36.6 C)  SpO2: 99%   General: Awake, oriented x4. CV:  Good peripheral perfusion.  Resp:  Normal effort.  Abd:  No distention.  Other:  Middle-aged overweight disheveled Caucasian male resting comfortably in no acute distress ED Results / Procedures / Treatments  Labs (all labs ordered are listed, but only abnormal results are displayed) Labs Reviewed  COMPREHENSIVE METABOLIC PANEL - Abnormal; Notable for the following components:      Result  Value   Glucose, Bld 108 (*)    All other components within normal limits  SALICYLATE LEVEL - Abnormal; Notable for the following components:   Salicylate Lvl <7.0 (*)    All other components within normal limits  ACETAMINOPHEN LEVEL - Abnormal; Notable for the following components:   Acetaminophen (Tylenol), Serum <10 (*)    All other components within normal limits  CBC - Abnormal; Notable for the following components:   WBC 11.4 (*)    All other components within normal limits  URINE DRUG SCREEN, QUALITATIVE ([facility] ONLY) - Abnormal; Notable for the following components:   Cannabinoid 50 Ng, Ur [place] POSITIVE (*)    All other components within normal limits  ETHANOL   PROCEDURES: Critical Care performed: No Procedures MEDICATIONS ORDERED IN ED: Medications - No data to display IMPRESSION / MDM / ASSESSMENT AND PLAN / ED COURSE  I reviewed the triage vital signs and the nursing notes.                             The patient is on the cardiac monitor to evaluate for evidence of arrhythmia and/or significant heart rate  changes. Patient's presentation is most consistent with acute presentation with potential threat to life or bodily function. Thoughts are linear and organized, and patient has no AH, VH, or HI. Prior suicide attempt: Wrist cutting Prior Psychiatric Hospitalizations: Multiple  Clinically patient displays no overt toxidrome; they are well appearing, with low suspicion for toxic ingestion given history and exam. Thoughts unlikely 2/2 anemia, hypothyroidism, infection, or ICH.  Consult: Psychiatry to evaluate patient for potential hold for danger to self. Disposition: Care of this patient will be signed out to the oncoming physician at the end of my shift.  All pertinent patient information conveyed and all questions answered.  All further care and disposition decisions will be made by the oncoming physician.   FINAL CLINICAL IMPRESSION(S) / ED DIAGNOSES   Final  diagnoses:  Suicidal ideation   Rx / DC Orders   ED Discharge Orders     None      Note:  This document was prepared using Dragon voice recognition software and may include unintentional dictation errors.   Merwyn Katos, MD 04/14/23 [phone]

## 2023-04-13 NOTE — ED Notes (Signed)
Patient taken to interview room with TTS

## 2023-04-14 ENCOUNTER — Inpatient Hospital Stay (HOSPITAL_COMMUNITY)
Admission: EM | Admit: 2023-04-14 | Discharge: 2023-04-22 | DRG: 885 | Disposition: A | Discharge status: Home / Self Care | Payer: MEDICAID | Source: Intra-hospital | Attending: Psychiatry | Admitting: Psychiatry

## 2023-04-14 ENCOUNTER — Encounter (HOSPITAL_COMMUNITY): Payer: Self-pay | Admitting: Psychiatry

## 2023-04-14 ENCOUNTER — Other Ambulatory Visit: Payer: Self-pay

## 2023-04-14 DIAGNOSIS — F419 Anxiety disorder, unspecified: Secondary | ICD-10-CM | POA: Diagnosis present

## 2023-04-14 DIAGNOSIS — F1110 Opioid abuse, uncomplicated: Secondary | ICD-10-CM | POA: Diagnosis present

## 2023-04-14 DIAGNOSIS — Z9151 Personal history of suicidal behavior: Secondary | ICD-10-CM | POA: Diagnosis not present

## 2023-04-14 DIAGNOSIS — F319 Bipolar disorder, unspecified: Principal | ICD-10-CM | POA: Diagnosis present

## 2023-04-14 DIAGNOSIS — Z818 Family history of other mental and behavioral disorders: Secondary | ICD-10-CM | POA: Diagnosis not present

## 2023-04-14 DIAGNOSIS — F1310 Sedative, hypnotic or anxiolytic abuse, uncomplicated: Secondary | ICD-10-CM | POA: Diagnosis present

## 2023-04-14 DIAGNOSIS — Z5900 Homelessness unspecified: Secondary | ICD-10-CM

## 2023-04-14 DIAGNOSIS — M62838 Other muscle spasm: Secondary | ICD-10-CM | POA: Diagnosis present

## 2023-04-14 DIAGNOSIS — R41843 Psychomotor deficit: Secondary | ICD-10-CM | POA: Diagnosis present

## 2023-04-14 DIAGNOSIS — F1010 Alcohol abuse, uncomplicated: Secondary | ICD-10-CM | POA: Diagnosis present

## 2023-04-14 DIAGNOSIS — G4733 Obstructive sleep apnea (adult) (pediatric): Secondary | ICD-10-CM | POA: Diagnosis present

## 2023-04-14 DIAGNOSIS — I10 Essential (primary) hypertension: Secondary | ICD-10-CM | POA: Diagnosis present

## 2023-04-14 DIAGNOSIS — F1510 Other stimulant abuse, uncomplicated: Secondary | ICD-10-CM | POA: Diagnosis present

## 2023-04-14 DIAGNOSIS — F17210 Nicotine dependence, cigarettes, uncomplicated: Secondary | ICD-10-CM | POA: Diagnosis present

## 2023-04-14 DIAGNOSIS — R45851 Suicidal ideations: Secondary | ICD-10-CM | POA: Diagnosis present

## 2023-04-14 DIAGNOSIS — Z79899 Other long term (current) drug therapy: Secondary | ICD-10-CM | POA: Diagnosis not present

## 2023-04-14 HISTORY — DX: Other specified health status: Z78.9

## 2023-04-14 MED ORDER — PRAZOSIN HCL 1 MG PO CAPS: 1.0000 mg | ORAL_CAPSULE | Freq: Every day | ORAL | Status: DC

## 2023-04-14 MED ORDER — PRAZOSIN HCL 1 MG PO CAPS
1.0000 mg | ORAL_CAPSULE | Freq: Every day | ORAL | Status: DC
  Administered 2023-04-14 – 2023-04-21 (×8): 1 mg via ORAL
  Filled 2023-04-14 (×10): qty 1

## 2023-04-14 MED ORDER — THIAMINE MONONITRATE 100 MG PO TABS
100.0000 mg | ORAL_TABLET | Freq: Once | ORAL | Status: AC
  Administered 2023-04-14: 100 mg via ORAL
  Filled 2023-04-14: qty 1

## 2023-04-14 MED ORDER — PANTOPRAZOLE SODIUM 40 MG PO TBEC
40.0000 mg | DELAYED_RELEASE_TABLET | Freq: Every day | ORAL | Status: DC
  Administered 2023-04-14: 40 mg via ORAL
  Filled 2023-04-14: qty 1

## 2023-04-14 MED ORDER — BUSPIRONE HCL 5 MG PO TABS
10.0000 mg | ORAL_TABLET | Freq: Three times a day (TID) | ORAL | Status: DC
  Administered 2023-04-14: 10 mg via ORAL
  Filled 2023-04-14: qty 2

## 2023-04-14 MED ORDER — MAGNESIUM HYDROXIDE 400 MG/5ML PO SUSP: 30.0000 mL | Freq: Every day | ORAL | Status: DC | PRN

## 2023-04-14 MED ORDER — HYDROXYZINE HCL 25 MG PO TABS: 25.0000 mg | ORAL_TABLET | Freq: Four times a day (QID) | ORAL | Status: DC | PRN

## 2023-04-14 MED ORDER — ONDANSETRON 4 MG PO TBDP: 4.0000 mg | ORAL_TABLET | Freq: Four times a day (QID) | ORAL | Status: DC | PRN

## 2023-04-14 MED ORDER — BUSPIRONE HCL 10 MG PO TABS
10.0000 mg | ORAL_TABLET | Freq: Three times a day (TID) | ORAL | Status: DC
  Administered 2023-04-14 – 2023-04-22 (×23): 10 mg via ORAL
  Filled 2023-04-14: qty 2
  Filled 2023-04-14 (×26): qty 1

## 2023-04-14 MED ORDER — HYDROXYZINE HCL 25 MG PO TABS
25.0000 mg | ORAL_TABLET | Freq: Three times a day (TID) | ORAL | Status: DC | PRN
  Administered 2023-04-14 – 2023-04-21 (×8): 25 mg via ORAL
  Filled 2023-04-14 (×8): qty 1

## 2023-04-14 MED ORDER — ADULT MULTIVITAMIN W/MINERALS CH: 1.0000 | ORAL_TABLET | Freq: Every day | ORAL | Status: DC

## 2023-04-14 MED ORDER — GUANFACINE HCL ER 2 MG PO TB24
2.0000 mg | ORAL_TABLET | Freq: Every day | ORAL | Status: DC
  Administered 2023-04-14 – 2023-04-21 (×8): 2 mg via ORAL
  Filled 2023-04-14 (×9): qty 1

## 2023-04-14 MED ORDER — PANTOPRAZOLE SODIUM 40 MG PO TBEC
40.0000 mg | DELAYED_RELEASE_TABLET | Freq: Every day | ORAL | Status: DC
  Administered 2023-04-15 – 2023-04-22 (×8): 40 mg via ORAL
  Filled 2023-04-14 (×10): qty 1

## 2023-04-14 MED ORDER — ALUM & MAG HYDROXIDE-SIMETH 200-200-20 MG/5ML PO SUSP
30.0000 mL | ORAL | Status: DC | PRN
  Administered 2023-04-16 – 2023-04-21 (×7): 30 mL via ORAL
  Filled 2023-04-14 (×7): qty 30

## 2023-04-14 MED ORDER — METHOCARBAMOL 500 MG PO TABS
500.0000 mg | ORAL_TABLET | Freq: Four times a day (QID) | ORAL | Status: DC | PRN
  Administered 2023-04-14 – 2023-04-21 (×9): 500 mg via ORAL
  Filled 2023-04-14 (×10): qty 1

## 2023-04-14 MED ORDER — LORAZEPAM 1 MG PO TABS: 1.0000 mg | ORAL_TABLET | ORAL | Status: DC | PRN

## 2023-04-14 MED ORDER — OLANZAPINE 5 MG PO TBDP: 5.0000 mg | ORAL_TABLET | Freq: Three times a day (TID) | ORAL | Status: DC | PRN

## 2023-04-14 MED ORDER — MIRTAZAPINE 15 MG PO TABS: 30.0000 mg | ORAL_TABLET | Freq: Every day | ORAL | Status: DC

## 2023-04-14 MED ORDER — ARIPIPRAZOLE ER 400 MG IM PRSY
400.0000 mg | PREFILLED_SYRINGE | INTRAMUSCULAR | Status: DC
  Administered 2023-04-17: 400 mg via INTRAMUSCULAR

## 2023-04-14 MED ORDER — ADULT MULTIVITAMIN W/MINERALS CH
1.0000 | ORAL_TABLET | Freq: Every day | ORAL | Status: DC
  Administered 2023-04-15 – 2023-04-22 (×8): 1 via ORAL
  Filled 2023-04-14 (×10): qty 1

## 2023-04-14 MED ORDER — ADULT MULTIVITAMIN W/MINERALS CH
1.0000 | ORAL_TABLET | Freq: Every day | ORAL | Status: DC
  Administered 2023-04-14: 1 via ORAL
  Filled 2023-04-14: qty 1

## 2023-04-14 MED ORDER — THIAMINE MONONITRATE 100 MG PO TABS: 100.0000 mg | ORAL_TABLET | Freq: Every day | ORAL | Status: DC

## 2023-04-14 MED ORDER — MIRTAZAPINE 30 MG PO TABS
30.0000 mg | ORAL_TABLET | Freq: Every day | ORAL | Status: DC
  Administered 2023-04-14 – 2023-04-21 (×8): 30 mg via ORAL
  Filled 2023-04-14 (×10): qty 1

## 2023-04-14 MED ORDER — ZIPRASIDONE MESYLATE 20 MG IM SOLR: 20.0000 mg | INTRAMUSCULAR | Status: DC | PRN

## 2023-04-14 MED ORDER — LORAZEPAM 1 MG PO TABS: 1.0000 mg | ORAL_TABLET | Freq: Four times a day (QID) | ORAL | Status: DC | PRN

## 2023-04-14 MED ORDER — ACETAMINOPHEN 325 MG PO TABS
650.0000 mg | ORAL_TABLET | Freq: Four times a day (QID) | ORAL | Status: DC | PRN
  Administered 2023-04-14: 650 mg via ORAL
  Filled 2023-04-14: qty 2

## 2023-04-14 MED ORDER — THIAMINE HCL 100 MG/ML IJ SOLN: 100.0000 mg | Freq: Once | INTRAMUSCULAR | Status: DC

## 2023-04-14 MED ORDER — NICOTINE 21 MG/24HR TD PT24
21.0000 mg | MEDICATED_PATCH | Freq: Every day | TRANSDERMAL | Status: DC
  Administered 2023-04-14: 21 mg via TRANSDERMAL
  Filled 2023-04-14: qty 1

## 2023-04-14 MED ORDER — NICOTINE 21 MG/24HR TD PT24
21.0000 mg | MEDICATED_PATCH | Freq: Every day | TRANSDERMAL | Status: DC
  Administered 2023-04-15 – 2023-04-22 (×8): 21 mg via TRANSDERMAL
  Filled 2023-04-14 (×11): qty 1

## 2023-04-14 MED ORDER — TRAZODONE HCL 50 MG PO TABS: 50.0000 mg | ORAL_TABLET | Freq: Every evening | ORAL | Status: DC | PRN

## 2023-04-14 MED ORDER — LOPERAMIDE HCL 2 MG PO CAPS: 2.0000 mg | ORAL_CAPSULE | ORAL | Status: DC | PRN

## 2023-04-14 NOTE — Progress Notes (Signed)
   04/14/23 2300  Psych Admission Type (Psych Patients Only)  Admission Status Voluntary  Psychosocial Assessment  Patient Complaints Anxiety  Eye Contact Fair  Facial Expression Animated  Affect Appropriate to circumstance  Speech Logical/coherent  Interaction Assertive  Motor Activity Slow  Appearance/Hygiene Unremarkable  Behavior Characteristics Cooperative;Appropriate to situation  Mood Anxious  Thought Process  Coherency WDL  Content WDL  Delusions None reported or observed  Perception WDL  Hallucination None reported or observed  Judgment Poor  Confusion None  Danger to Self  Current suicidal ideation? Denies  Agreement Not to Harm Self Yes  Description of Agreement verbal  Danger to Others  Danger to Others None reported or observed

## 2023-04-14 NOTE — Plan of Care (Signed)

## 2023-04-14 NOTE — ED Notes (Addendum)
EMTALA Reviewed by this RN.  

## 2023-04-14 NOTE — ED Notes (Signed)
SAFE  TRANSPORT  CALLED  FOR  TRANSFER  TO  [facility]

## 2023-04-14 NOTE — ED Notes (Signed)
Patient placed into interview room for Telepsych consult with Dr. Jerene Dilling at this time.

## 2023-04-14 NOTE — Group Note (Signed)
Date:  04/14/2023 Time:  11:24 PM  Group Topic/Focus:  Narcotics Anonymous (NA) Meeting    Participation Level:  Active  Participation Quality:  Appropriate  Affect:  Appropriate  Cognitive:  Appropriate  Insight: Appropriate  Engagement in Group:  Engaged  Modes of Intervention:  Discussion and Support  Additional Comments:  Patient attended NA Meeting.   Kennieth Francois 04/14/2023, 11:24 PM

## 2023-04-14 NOTE — Progress Notes (Signed)
[facility] Consult Note  Patient Name: Cody Young. MRN: [id number] DOB: 1991-04-30 DATE OF Consult: 04/14/2023  PRIMARY PSYCHIATRIC DIAGNOSES  1.  Substance use disorder  2.  Suicidal ideation  3.  Bipolar disorder per history   RECOMMENDATIONS  Recommendations: Medication recommendations: Continue current home medications  Non-Medication/therapeutic recommendations: Patient requires safe environment. Inpatient admission or rehab, whichever becomes available first  Is inpatient psychiatric hospitalization recommended for this patient? Yes (Explain why): patient is hopeless and suicidal  Follow-Up Telepsychiatry C/L services: We will sign off for now. Please re-consult our service if needed for any concerning changes in the patient's condition, discharge planning, or questions.  Thank you for involving [place] in the care of this patient. If you have any additional questions or concerns, please call [phone] and ask for me or the provider on-call.  TELEPSYCHIATRY ATTESTATION & CONSENT  As the provider for this telehealth consult, I attest that I verified the patient's identity using two separate identifiers, introduced myself to the patient, provided my credentials, disclosed my location, and performed this encounter via a HIPAA-compliant, real-time, face-to-face, two-way, interactive audio and video platform and with the full consent and agreement of the patient (or guardian as applicable.)  Patient physical location: [facility] Emergency Department at [facility] . Telehealth provider physical location: home office in state of [place].  Video start time: 2:45 am  (Central Time) Video end time: 3:05 (Central Time)  IDENTIFYING DATA  Cody Young. is a 32 y.o. year-old male for whom a psychiatric consultation has been ordered by the primary provider. The patient was identified using two separate identifiers.  CHIEF COMPLAINT/REASON FOR CONSULT    Suicidal  ideation  HISTORY OF PRESENT ILLNESS (HPI)  The patient  is a 32 year old male with a history of polysubstance use disorder as well as bipolar disorder per chart who was discharged from inpatient hospitalization yesterday.  He went home, his mother did not allow him in the house as her stipulation was for him to go to rehab.  He tried to go to a couple different shelters but there was no availability.  He became hopeless, suicidal, and came back to the emergency department. During my assessment, he looks quite depressed and flat.  He says he feels hopeless, is planning on killing himself by cutting him his wrist or hanging himself.  He feels that he needs readmission but is willing to go to rehab if that is immediately available. He reports compliance with medication.Marland Kitchen  PAST PSYCHIATRIC HISTORY   Otherwise as per HPI above.  PAST MEDICAL HISTORY  Past Medical History:  Diagnosis Date   Bipolar 1 disorder (HCC)    Hypertension    Mood and affect disturbance      HOME MEDICATIONS  Facility Ordered Medications  Medication   busPIRone (BUSPAR) tablet 10 mg   mirtazapine (REMERON) tablet 30 mg   multivitamin with minerals tablet 1 tablet   nicotine (NICODERM CQ - dosed in mg/24 hours) patch 21 mg   pantoprazole (PROTONIX) EC tablet 40 mg   prazosin (MINIPRESS) capsule 1 mg   PTA Medications  Medication Sig   ARIPiprazole ER (ABILIFY MAINTENA) 400 MG PRSY prefilled syringe Inject 400 mg into the muscle every 30 (thirty) days.   busPIRone (BUSPAR) 10 MG tablet Take 1 tablet (10 mg total) by mouth 3 (three) times daily.   Multiple Vitamin (MULTIVITAMIN WITH MINERALS) TABS tablet Take 1 tablet by mouth daily.   nicotine (NICODERM CQ - DOSED IN  MG/24 HOURS) 21 mg/24hr patch Place 1 patch (21 mg total) onto the skin daily.   pantoprazole (PROTONIX) 40 MG tablet Take 1 tablet (40 mg total) by mouth daily.   prazosin (MINIPRESS) 1 MG capsule Take 1 capsule (1 mg total) by mouth at bedtime.    traZODone (DESYREL) 50 MG tablet Take 1 tablet (50 mg total) by mouth at bedtime as needed for sleep.   hydrOXYzine (ATARAX) 25 MG tablet Take 1 tablet (25 mg total) by mouth 3 (three) times daily as needed for anxiety.   mirtazapine (REMERON) 30 MG tablet Take 1 tablet (30 mg total) by mouth at bedtime.     ALLERGIES  No Known Allergies  SOCIAL & SUBSTANCE USE HISTORY  Social History   Socioeconomic History   Marital status: Single    Spouse name: Not on file   Number of children: Not on file   Years of education: Not on file   Highest education level: Not on file  Occupational History   Not on file  Tobacco Use   Smoking status: Every Day    Current packs/day: 1.00    Average packs/day: 1 pack/day for 20.0 years (20.0 ttl pk-yrs)    Types: Cigarettes   Smokeless tobacco: Never  Substance and Sexual Activity   Alcohol use: Yes    Alcohol/week: 12.0 standard drinks of alcohol    Types: 12 Cans of beer per week    Comment: Currently drinks mad dogs   Drug use: Yes    Types: Marijuana, Cocaine, "Crack" cocaine    Comment: deneis current cocaine use   Sexual activity: Yes    Birth control/protection: Condom  Other Topics Concern   Not on file  Social History Narrative   Not on file   Social Determinants of Health   Financial Resource Strain: Not on file  Food Insecurity: No Food Insecurity (04/05/2023)   Hunger Vital Sign    Worried About Running Out of Food in the Last Year: Never true    Ran Out of Food in the Last Year: Never true  Transportation Needs: No Transportation Needs (04/05/2023)   PRAPARE - Administrator, Civil Service (Medical): No    Lack of Transportation (Non-Medical): No  Physical Activity: Not on file  Stress: Not on file  Social Connections: Not on file   Social History   Tobacco Use  Smoking Status Every Day   Current packs/day: 1.00   Average packs/day: 1 pack/day for 20.0 years (20.0 ttl pk-yrs)   Types: Cigarettes   Smokeless Tobacco Never   Social History   Substance and Sexual Activity  Alcohol Use Yes   Alcohol/week: 12.0 standard drinks of alcohol   Types: 12 Cans of beer per week   Comment: Currently drinks mad dogs   Social History   Substance and Sexual Activity  Drug Use Yes   Types: Marijuana, Cocaine, "Crack" cocaine   Comment: deneis current cocaine use      FAMILY HISTORY  History reviewed. No pertinent family history. Family Psychiatric History (if known):  denies  MENTAL STATUS EXAM (MSE)  Presentation  General Appearance:  Appropriate for Environment  Eye Contact: Good  Speech: Clear and Coherent  Speech Volume: Normal  Handedness: Right   Mood and Affect  Mood: Depressed  Affect: Flat   Thought Process  Thought Processes: Coherent  Descriptions of Associations: Intact  Orientation: Full (Time, Place and Person)  Thought Content: Logical  History of Schizophrenia/Schizoaffective disorder: No  Duration  of Psychotic Symptoms: N/A  Hallucinations: Hallucinations: None  Ideas of Reference: None  Suicidal Thoughts: Suicidal Thoughts: Yes, Active SI Active Intent and/or Plan: With Plan; With Means to Carry Out; With Intent  Homicidal Thoughts: Homicidal Thoughts: No   Sensorium  Memory: Immediate Fair; Recent Fair  Judgment: Fair  Insight: Fair   Chartered certified accountant: Fair  Attention Span: Fair  Recall: [employer] of Knowledge: Fair  Language: Fair   Psychomotor Activity  Psychomotor Activity: Psychomotor Activity: Normal   Assets  Assets: Resilience   Sleep  Sleep: Sleep: Fair   VITALS  Blood pressure (!) 143/104, pulse 99, temperature 97.9 F (36.6 C), temperature source Oral, resp. rate 20, height 6\' 2"  (1.88 m), weight 115 kg, SpO2 99%.  LABS  Admission on 04/13/2023  Component Date Value Ref Range Status   Sodium 04/13/2023 138  135 - 145 mmol/L Final   Potassium  04/13/2023 3.7  3.5 - 5.1 mmol/L Final   Chloride 04/13/2023 101  98 - 111 mmol/L Final   CO2 04/13/2023 24  22 - 32 mmol/L Final   Glucose, Bld 04/13/2023 108 (H)  70 - 99 mg/dL Final   Glucose reference range applies only to samples taken after fasting for at least 8 hours.   BUN 04/13/2023 18  6 - 20 mg/dL Final   Creatinine, Ser 04/13/2023 1.03  0.61 - 1.24 mg/dL Final   Calcium 41/32/4401 9.7  8.9 - 10.3 mg/dL Final   Total Protein 02/72/5366 7.8  6.5 - 8.1 g/dL Final   Albumin 44/08/4740 4.8  3.5 - 5.0 g/dL Final   AST 59/56/3875 24  15 - 41 U/L Final   ALT 04/13/2023 36  0 - 44 U/L Final   Alkaline Phosphatase 04/13/2023 50  38 - 126 U/L Final   Total Bilirubin 04/13/2023 0.9  0.3 - 1.2 mg/dL Final   GFR, Estimated 04/13/2023 >60  >60 mL/min Final   Comment: (NOTE) Calculated using the CKD-EPI Creatinine Equation (2021)    Anion gap 04/13/2023 13  5 - 15 Final   Performed at [facility], [address] [place], [place] [address]   Alcohol, Ethyl (B) 04/13/2023 <10  <10 mg/dL Final   Comment: (NOTE) Lowest detectable limit for serum alcohol is 10 mg/dL.  For medical purposes only. Performed at [facility], [address] [place], [place] [address]    Salicylate Lvl 04/13/2023 <7.0 (L)  7.0 - 30.0 mg/dL Final   Performed at [facility], [address] [place], [place] [address]   Acetaminophen (Tylenol), Serum 04/13/2023 <10 (L)  10 - 30 ug/mL Final   Comment: (NOTE) Therapeutic concentrations vary significantly. A range of 10-30 ug/mL  may be an effective concentration for many patients. However, some  are best treated at concentrations outside of this range. Acetaminophen concentrations >150 ug/mL at 4 hours after ingestion  and >50 ug/mL at 12 hours after ingestion are often associated with  toxic reactions.  Performed at [facility], [address] [place], [place] [address]    WBC 04/13/2023 11.4 (H)  4.0 - 10.5 K/uL  Final   RBC 04/13/2023 5.25  4.22 - 5.81 MIL/uL Final   Hemoglobin 04/13/2023 15.8  13.0 - 17.0 g/dL Final   HCT 06/30/3233 47.5  39.0 - 52.0 % Final   MCV 04/13/2023 90.5  80.0 - 100.0 fL Final   MCH 04/13/2023 30.1  26.0 - 34.0 pg Final   MCHC 04/13/2023 33.3  30.0 -  36.0 g/dL Final   RDW 21/30/8657 13.0  11.5 - 15.5 % Final   Platelets 04/13/2023 244  150 - 400 K/uL Final   nRBC 04/13/2023 0.0  0.0 - 0.2 % Final   Performed at [facility], [address] [place], [place] [address]   Tricyclic, Ur Screen 04/13/2023 NONE DETECTED  NONE DETECTED Final   Amphetamines, Ur Screen 04/13/2023 NONE DETECTED  NONE DETECTED Final   MDMA (Ecstasy)Ur Screen 04/13/2023 NONE DETECTED  NONE DETECTED Final   Cocaine Metabolite,Ur [place] 04/13/2023 NONE DETECTED  NONE DETECTED Final   Opiate, Ur Screen 04/13/2023 NONE DETECTED  NONE DETECTED Final   Phencyclidine (PCP) Ur S 04/13/2023 NONE DETECTED  NONE DETECTED Final   Cannabinoid 50 Ng, Ur [place] 04/13/2023 POSITIVE (A)  NONE DETECTED Final   Barbiturates, Ur Screen 04/13/2023 NONE DETECTED  NONE DETECTED Final   Benzodiazepine, Ur Scrn 04/13/2023 NONE DETECTED  NONE DETECTED Final   Methadone Scn, Ur 04/13/2023 NONE DETECTED  NONE DETECTED Final   Comment: (NOTE) Tricyclics + metabolites, urine    Cutoff 1000 ng/mL Amphetamines + metabolites, urine  Cutoff 1000 ng/mL MDMA (Ecstasy), urine              Cutoff 500 ng/mL Cocaine Metabolite, urine          Cutoff 300 ng/mL Opiate + metabolites, urine        Cutoff 300 ng/mL Phencyclidine (PCP), urine         Cutoff 25 ng/mL Cannabinoid, urine                 Cutoff 50 ng/mL Barbiturates + metabolites, urine  Cutoff 200 ng/mL Benzodiazepine, urine              Cutoff 200 ng/mL Methadone, urine                   Cutoff 300 ng/mL  The urine drug screen provides only a preliminary, unconfirmed analytical test result and should not be used for non-medical purposes. Clinical consideration and  professional judgment should be applied to any positive drug screen result due to possible interfering substances. A more specific alternate chemical method must be used in order to obtain a confirmed analytical result. Gas chromatography / mass spectrometry (GC/MS) is the preferred confirm                          atory method. Performed at [facility], [address], [place], [place] [address]     PSYCHIATRIC REVIEW OF SYSTEMS (ROS)  ROS: Notable for the following relevant positive findings: ROS  Additional findings:      Musculoskeletal: No abnormal movements observed      Gait & Station: Laying/Sitting      Pain Screening: Denies   RISK FORMULATION/ASSESSMENT  Is the patient experiencing any suicidal or homicidal ideations: Yes       Explain if yes: plan to cut wrist or hang self  Protective factors considered for safety management: help seeking   Risk factors/concerns considered for safety management:  Depression Substance abuse/dependence Access to lethal means Male gender Unmarried  Is there a safety management plan with the patient and treatment team to minimize risk factors and promote protective factors: Yes           Explain: Inpatient admission followed by rehab  Is crisis care placement or psychiatric hospitalization recommended: Yes     Based on my current evaluation and  risk assessment, patient is determined at this time to be at:  High risk  *RISK ASSESSMENT Risk assessment is a dynamic process; it is possible that this patient's condition, and risk level, may change. This should be re-evaluated and managed over time as appropriate. Please re-consult psychiatric consult services if additional assistance is needed in terms of risk assessment and management. If your team decides to discharge this patient, please advise the patient how to best access emergency psychiatric services, or to call 911, if their condition worsens or they feel unsafe in any  way.   Dian Situ, MD Telepsychiatry Consult ServicesPatient ID: Cody Young., male   DOB: 02/28/1991, 32 y.o.   MRN: [id number]

## 2023-04-14 NOTE — ED Provider Notes (Signed)
Emergency Medicine Observation Re-evaluation Note  Cody Young. is a 32 y.o. male, seen on rounds today.  Pt initially presented to the ED for complaints of Suicidal Currently, the patient is resting, voices no medical complaints.  Physical Exam  BP (!) 143/104 (BP Location: Left Arm)   Pulse 99   Temp 97.9 F (36.6 C) (Oral)   Resp 20   Ht 6\' 2"  (1.88 m)   Wt 115 kg   SpO2 99%   BMI 32.55 kg/m  Physical Exam General: Resting in no acute distress Cardiac: No cyanosis Lungs: Equal rise and fall Psych: Not agitated  ED Course / MDM  EKG:   I have reviewed the labs performed to date as well as medications administered while in observation.  Recent changes in the last 24 hours include no events overnight.  Plan  Current plan is for psychiatric disposition.    Irean Hong, MD 04/14/23 [id number]

## 2023-04-14 NOTE — ED Notes (Signed)
Pt departs ED with Safe Transport

## 2023-04-14 NOTE — BHH Suicide Risk Assessment (Signed)
[facility] Admission Suicide Risk Assessment   Nursing information obtained from:  Patient, Review of record Demographic factors:  Male, Caucasian, Low socioeconomic status, Living alone Current Mental Status:  Self-harm thoughts, Self-harm behaviors Loss Factors:  Decline in physical health Historical Factors:  Prior suicide attempts, Impulsivity Risk Reduction Factors:  Positive coping skills or problem solving skills  Total Time spent with patient: 30 minutes Principal Problem: <principal problem not specified> Diagnosis:  Active Problems:   * No active hospital problems. *  Subjective Data: 32 year old Caucasian male with history of bipolar depression and substance use disorder admitted with increasing suicidal ideations and depression in the context of placement issues.  He was recently discharged from this facility.  Continued Clinical Symptoms:  Alcohol Use Disorder Identification Test Final Score (AUDIT): 0 The "Alcohol Use Disorders Identification Test", Guidelines for Use in Primary Care, Second Edition.  World Science writer [facility]). Score between 0-7:  no or low risk or alcohol related problems. Score between 8-15:  moderate risk of alcohol related problems. Score between 16-19:  high risk of alcohol related problems. Score 20 or above:  warrants further diagnostic evaluation for alcohol dependence and treatment.   CLINICAL FACTORS:   Bipolar Disorder:   Mixed State Depressive phase Depression:   Impulsivity   Musculoskeletal: Strength & Muscle Tone: within normal limits Gait & Station: normal Patient leans: N/A  Psychiatric Specialty Exam:  Presentation  General Appearance:  Appropriate for Environment  Eye Contact: Good  Speech: Clear and Coherent  Speech Volume: Normal  Handedness: Right   Mood and Affect  Mood: Depressed  Affect: Flat   Thought Process  Thought Processes: Coherent  Descriptions of Associations:Intact  Orientation:Full  (Time, Place and Person)  Thought Content:Logical  History of Schizophrenia/Schizoaffective disorder:No  Duration of Psychotic Symptoms:N/A  Hallucinations:Hallucinations: None  Ideas of Reference:None  Suicidal Thoughts:Suicidal Thoughts: Yes, Active SI Active Intent and/or Plan: With Plan; With Means to Carry Out; With Intent  Homicidal Thoughts:Homicidal Thoughts: No   Sensorium  Memory: Immediate Fair; Recent Fair  Judgment: Fair  Insight: Fair   Art therapist  Concentration: Fair  Attention Span: Fair  Recall: [employer] of Knowledge: Fair  Language: Fair   Psychomotor Activity  Psychomotor Activity: Psychomotor Activity: Normal   Assets  Assets: Resilience   Sleep  Sleep: Sleep: Fair    Physical Exam: Physical Exam Constitutional:      Appearance: Normal appearance.  HENT:     Head: Normocephalic.  Neurological:     General: No focal deficit present.     Mental Status: He is alert and oriented to person, place, and time. Mental status is at baseline.  Psychiatric:        Thought Content: Thought content normal.    Review of Systems  Psychiatric/Behavioral:  Positive for depression and suicidal ideas. The patient is nervous/anxious.   All other systems reviewed and are negative.  Blood pressure (!) 138/102, pulse 85, temperature 98 F (36.7 C), temperature source Oral, resp. rate 18, height 6\' 2"  (1.88 m), weight 123.6 kg, SpO2 99%. Body mass index is 34.97 kg/m.   COGNITIVE FEATURES THAT CONTRIBUTE TO RISK:  Thought constriction (tunnel vision)    SUICIDE RISK:   Moderate:  Frequent suicidal ideation with limited intensity, and duration, some specificity in terms of plans, no associated intent, good self-control, limited dysphoria/symptomatology, some risk factors present, and identifiable protective factors, including available and accessible social support.  PLAN OF CARE: The patient is admitted for stabilization  and  is safe discharge plan.  I certify that inpatient services furnished can reasonably be expected to improve the patient's condition.   Rex Kras, MD 04/14/2023, 1:34 PM

## 2023-04-14 NOTE — ED Notes (Signed)
PT VOL/PENDING PSYCH CONSULT. 

## 2023-04-14 NOTE — BH Assessment (Signed)
Patient has been accepted to [facility].  Patient assigned to room 405-01. Accepting physician is Goli.  Call report to [phone].  Representative was [employer].    ER Staff is aware of it:  Rhasheena, ER Secretary  Dr. Dolores Frame, ER MD  Joycelyn Schmid, Patient's Nurse  Pt can be transported 04/14/23 at 10 AM.

## 2023-04-14 NOTE — H&P (Signed)
Psychiatric Admission Assessment Adult  Patient Identification: Cody Young. MRN:  [id number] Date of Evaluation:  04/14/2023 Chief Complaint:  Substance abuse do Principal Diagnosis: <principal problem not specified> Diagnosis:  Active Problems:   * No active hospital problems. * Identifying information and reason for admission: 32 year old Caucasian male with a history of bipolar disorder and substance use disorder admitted with increasing depression and suicidal ideations with a plan to either slit his wrist or hang himself. History of Present Illness:  The patient was recently discharged from this facility on 04/13/2023 with a safety plan.  Apparently went to the [facility] and was referred to the shelter which refused to take him.  Information obtained from the patient indicates that the patient was denied because he already" has a place to stay".  Previously he was living with his mother and he cannot go back to stay with her.  Apparently when he contacted his mother declined to have him come home.  Basically had no place to stay.  He became frustrated and walked to the hospital stating that he was feeling depressed and had thoughts of wanting to slit his wrists or hang himself.  He was evaluated on tele-psychiatry and was recommended inpatient stabilization. As noted earlier the patient was admitted to [facility] on 04/05/2023 and discharged on 04/13/2023 with a safety plan.  Unfortunately his discharge plan did not materialize and he had no place to go.  During his prior admission he had been on alcohol and drugs binging for about 4 days.  He was also suicidal and depressed.  Mental status examination: When seen today the patient was lying in bed somewhat withdrawn and appeared depressed.  He maintained fair eye contact.  His speech was coherent without any obvious looseness of associations, flight of ideas or tangentiality.  Patient continues to endorse depression and frustration at his lack  of placement.  He cannot go back to his mother's house.  He reports that he had passive thoughts of wanting to hurt himself but instead of acting on his impulses he decided to go to the hospital.  He denied abusing any drugs prior to admission however his UDS is positive for cannabinoids.  He denies any cravings or withdrawals.  He is contracting for safety.  Associated Signs/Symptoms: Depression Symptoms:  psychomotor retardation, suicidal thoughts with specific plan, (Hypo) Manic Symptoms:  Impulsivity, Anxiety Symptoms:  Excessive Worry, Psychotic Symptoms:  Paranoia, PTSD Symptoms: Negative Total Time spent with patient: 30 minutes  Past Psychiatric History: The patient was admitted to [facility] from 10/14 to 04/15/2023.  He returned back for the current admission within 24 hours.  He has multiple prior admissions with a diagnosis of bipolar disorder.  He reports using multiple medications including Abilify, Remeron, Cymbalta, prazosin, BuSpar.  He gives a history of at least 2 suicide attempts in the past.  Past medical history significant for history of OSA but he has not been tested for this.  He denies any history of seizures.  He denies any drug allergies.   Is the patient at risk to self? Yes.    Has the patient been a risk to self in the past 6 months? Yes.    Has the patient been a risk to self within the distant past? Yes.    Is the patient a risk to others? No.  Has the patient been a risk to others in the past 6 months? No.  Has the patient been a risk to others within  the distant past? No.   [place] Scale:  Flowsheet Row [facility]) [facility] from 04/13/2023 in [facility] Admission (Discharged) from [facility]  C-SSRS RISK CATEGORY Low Risk High Risk No Risk        Prior Inpatient Therapy: Yes.   If  yes, describe patient had multiple admissions in the past.  Recent discharge on 04/15/2023. Prior Outpatient Therapy: Yes.   If yes, describe noncompliant with outpatient treatment.  Alcohol Screening: Patient refused Alcohol Screening Tool: Yes (''nothing since i left yesterday) 1. How often do you have a drink containing alcohol?: Never 2. How many drinks containing alcohol do you have on a typical day when you are drinking?: 1 or 2 3. How often do you have six or more drinks on one occasion?: Never AUDIT-C Score: 0 4. How often during the last year have you found that you were not able to stop drinking once you had started?: Never 5. How often during the last year have you failed to do what was normally expected from you because of drinking?: Never 6. How often during the last year have you needed a first drink in the morning to get yourself going after a heavy drinking session?: Never 7. How often during the last year have you had a feeling of guilt of remorse after drinking?: Never 8. How often during the last year have you been unable to remember what happened the night before because you had been drinking?: Never 9. Have you or someone else been injured as a result of your drinking?: No 10. Has a relative or friend or a doctor or another health worker been concerned about your drinking or suggested you cut down?: No Alcohol Use Disorder Identification Test Final Score (AUDIT): 0 Alcohol Brief Interventions/Follow-up: Alcohol education/Brief advice Substance Abuse History in the last 12 months:  Yes.   Consequences of Substance Abuse: Withdrawal Symptoms:   Cramps Diaphoresis Nausea Tremors Previous Psychotropic Medications: Yes  Psychological Evaluations: Yes  Past Medical History:  Past Medical History:  Diagnosis Date   Bipolar 1 disorder (HCC)    Hypertension    Medical history non-contributory    Mood and affect disturbance     Past Surgical History:  Procedure Laterality  Date   BRAIN SURGERY     Family History: History reviewed. No pertinent family history. Family Psychiatric  History: According to the records mother has a history of bipolar disorder and maternal grandmother has schizophrenia. Tobacco Screening:  Social History   Tobacco Use  Smoking Status Every Day   Current packs/day: 1.00   Average packs/day: 1 pack/day for 20.0 years (20.0 ttl pk-yrs)   Types: Cigarettes  Smokeless Tobacco Never    BH Tobacco Counseling     Are you interested in Tobacco Cessation Medications?  Yes, implement Nicotene Replacement Protocol Counseled patient on smoking cessation:  Refused/Declined practical counseling Reason Tobacco Screening Not Completed: Patient Refused Screening       Social History:  Social History   Substance and Sexual Activity  Alcohol Use Yes   Alcohol/week: 12.0 standard drinks of alcohol   Types: 12 Cans of beer per week   Comment: Currently drinks mad dogs     Social History   Substance and Sexual Activity  Drug Use Yes   Types: Marijuana, Cocaine, "Crack" cocaine   Comment: deneis current cocaine use, none since last admit  Additional Social History:                           Allergies:  No Known Allergies Lab Results:  Results for orders placed or performed during the hospital encounter of 04/13/23 (from the past 48 hour(s))  Comprehensive metabolic panel     Status: Abnormal   Collection Time: 04/13/23  8:42 PM  Result Value Ref Range   Sodium 138 135 - 145 mmol/L   Potassium 3.7 3.5 - 5.1 mmol/L   Chloride 101 98 - 111 mmol/L   CO2 24 22 - 32 mmol/L   Glucose, Bld 108 (H) 70 - 99 mg/dL    Comment: Glucose reference range applies only to samples taken after fasting for at least 8 hours.   BUN 18 6 - 20 mg/dL   Creatinine, Ser 8.46 0.61 - 1.24 mg/dL   Calcium 9.7 8.9 - 96.2 mg/dL   Total Protein 7.8 6.5 - 8.1 g/dL   Albumin 4.8 3.5 - 5.0 g/dL   AST 24 15 - 41 U/L   ALT 36 0 - 44 U/L   Alkaline  Phosphatase 50 38 - 126 U/L   Total Bilirubin 0.9 0.3 - 1.2 mg/dL   GFR, Estimated >95 >28 mL/min    Comment: (NOTE) Calculated using the CKD-EPI Creatinine Equation (2021)    Anion gap 13 5 - 15    Comment: Performed at [facility], [address], [place], [place] [address]  Ethanol     Status: None   Collection Time: 04/13/23  8:42 PM  Result Value Ref Range   Alcohol, Ethyl (B) <10 <10 mg/dL    Comment: (NOTE) Lowest detectable limit for serum alcohol is 10 mg/dL.  For medical purposes only. Performed at [facility], [address] [place], [place] [address]   Salicylate level     Status: Abnormal   Collection Time: 04/13/23  8:42 PM  Result Value Ref Range   Salicylate Lvl <7.0 (L) 7.0 - 30.0 mg/dL    Comment: Performed at [facility], [address] [place], [place] [address]  Acetaminophen level     Status: Abnormal   Collection Time: 04/13/23  8:42 PM  Result Value Ref Range   Acetaminophen (Tylenol), Serum <10 (L) 10 - 30 ug/mL    Comment: (NOTE) Therapeutic concentrations vary significantly. A range of 10-30 ug/mL  may be an effective concentration for many patients. However, some  are best treated at concentrations outside of this range. Acetaminophen concentrations >150 ug/mL at 4 hours after ingestion  and >50 ug/mL at 12 hours after ingestion are often associated with  toxic reactions.  Performed at [facility], [address] [place], [place] [address]   cbc     Status: Abnormal   Collection Time: 04/13/23  8:42 PM  Result Value Ref Range   WBC 11.4 (H) 4.0 - 10.5 K/uL   RBC 5.25 4.22 - 5.81 MIL/uL   Hemoglobin 15.8 13.0 - 17.0 g/dL   HCT 47.4 25.9 - 56.3 %   MCV 90.5 80.0 - 100.0 fL   MCH 30.1 26.0 - 34.0 pg   MCHC 33.3 30.0 - 36.0 g/dL   RDW 87.5 64.3 - 32.9 %   Platelets 244 150 - 400 K/uL   nRBC 0.0 0.0 - 0.2 %    Comment: Performed at [facility], [address], [place], [place]  [address]  Urine Drug Screen, Qualitative  Status: Abnormal   Collection Time: 04/13/23  8:42 PM  Result Value Ref Range   Tricyclic, Ur Screen NONE DETECTED NONE DETECTED   Amphetamines, Ur Screen NONE DETECTED NONE DETECTED   MDMA (Ecstasy)Ur Screen NONE DETECTED NONE DETECTED   Cocaine Metabolite,Ur [place] NONE DETECTED NONE DETECTED   Opiate, Ur Screen NONE DETECTED NONE DETECTED   Phencyclidine (PCP) Ur S NONE DETECTED NONE DETECTED   Cannabinoid 50 Ng, Ur  POSITIVE (A) NONE DETECTED   Barbiturates, Ur Screen NONE DETECTED NONE DETECTED   Benzodiazepine, Ur Scrn NONE DETECTED NONE DETECTED   Methadone Scn, Ur NONE DETECTED NONE DETECTED    Comment: (NOTE) Tricyclics + metabolites, urine    Cutoff 1000 ng/mL Amphetamines + metabolites, urine  Cutoff 1000 ng/mL MDMA (Ecstasy), urine              Cutoff 500 ng/mL Cocaine Metabolite, urine          Cutoff 300 ng/mL Opiate + metabolites, urine        Cutoff 300 ng/mL Phencyclidine (PCP), urine         Cutoff 25 ng/mL Cannabinoid, urine                 Cutoff 50 ng/mL Barbiturates + metabolites, urine  Cutoff 200 ng/mL Benzodiazepine, urine              Cutoff 200 ng/mL Methadone, urine                   Cutoff 300 ng/mL  The urine drug screen provides only a preliminary, unconfirmed analytical test result and should not be used for non-medical purposes. Clinical consideration and professional judgment should be applied to any positive drug screen result due to possible interfering substances. A more specific alternate chemical method must be used in order to obtain a confirmed analytical result. Gas chromatography / mass spectrometry (GC/MS) is the preferred confirm atory method. Performed at [facility] [place], [address] [place], [place] [address]     Blood Alcohol level:  Lab Results  Component Value Date   [facility] <10 04/13/2023   ETH <10 04/04/2023    Metabolic Disorder Labs:  Lab Results  Component Value Date    HGBA1C 5.5 04/06/2023   MPG 111.15 04/06/2023   MPG 103 09/02/2022   No results found for: "PROLACTIN" Lab Results  Component Value Date   CHOL 166 04/06/2023   TRIG 96 04/06/2023   HDL 34 (L) 04/06/2023   CHOLHDL 4.9 04/06/2023   VLDL 19 04/06/2023   LDLCALC 113 (H) 04/06/2023   LDLCALC 132 (H) 09/02/2022    Current Medications: No current facility-administered medications for this encounter.   PTA Medications: Medications Prior to Admission  Medication Sig Dispense Refill Last Dose   ARIPiprazole ER (ABILIFY MAINTENA) 400 MG PRSY prefilled syringe Inject 400 mg into the muscle every 30 (thirty) days.      busPIRone (BUSPAR) 10 MG tablet Take 1 tablet (10 mg total) by mouth 3 (three) times daily. 90 tablet 0    guanFACINE (INTUNIV) 2 MG TB24 ER tablet Take 2 mg by mouth at bedtime.      hydrOXYzine (ATARAX) 25 MG tablet Take 1 tablet (25 mg total) by mouth 3 (three) times daily as needed for anxiety. 30 tablet 0    mirtazapine (REMERON) 30 MG tablet Take 1 tablet (30 mg total) by mouth at bedtime. 30 tablet 0    Multiple Vitamin (MULTIVITAMIN WITH MINERALS) TABS tablet Take 1  tablet by mouth daily.      nicotine (NICODERM CQ - DOSED IN MG/24 HOURS) 21 mg/24hr patch Place 1 patch (21 mg total) onto the skin daily. 28 patch 0    oxyCODONE (OXY IR/ROXICODONE) 5 MG immediate release tablet Take 5 mg by mouth every 4 (four) hours as needed.      pantoprazole (PROTONIX) 40 MG tablet Take 1 tablet (40 mg total) by mouth daily. 30 tablet 0    prazosin (MINIPRESS) 1 MG capsule Take 1 capsule (1 mg total) by mouth at bedtime. 30 capsule 0    traZODone (DESYREL) 50 MG tablet Take 1 tablet (50 mg total) by mouth at bedtime as needed for sleep. 30 tablet 0     Musculoskeletal: Strength & Muscle Tone: within normal limits Gait & Station: normal Patient leans: N/A            Psychiatric Specialty Exam:  Presentation  General Appearance:  Casual; Disheveled  Eye  Contact: Fair  Speech: Clear and Coherent  Speech Volume: Decreased  Handedness: Right   Mood and Affect  Mood: Depressed; Anxious  Affect: Restricted   Thought Process  Thought Processes: Linear  Duration of Psychotic Symptoms:N/A Past Diagnosis of Schizophrenia or Psychoactive disorder: No  Descriptions of Associations:Intact  Orientation:Full (Time, Place and Person)  Thought Content:Logical  Hallucinations:Hallucinations: None  Ideas of Reference:None  Suicidal Thoughts:Suicidal Thoughts: Yes, Passive SI Active Intent and/or Plan: With Intent; With Plan  Homicidal Thoughts:Homicidal Thoughts: No   Sensorium  Memory: Immediate Fair; Recent Fair; Remote Poor  Judgment: Poor  Insight: Fair   Chartered certified accountant: Fair  Attention Span: Fair  Recall: [employer] of Knowledge: Fair  Language: Fair   Psychomotor Activity  Psychomotor Activity: Psychomotor Activity: Normal   Assets  Assets: Manufacturing systems engineer; Desire for Improvement   Sleep  Sleep: Sleep: Fair    Physical Exam: Physical Exam Constitutional:      Appearance: Normal appearance.  Neurological:     General: No focal deficit present.     Mental Status: He is alert and oriented to person, place, and time. Mental status is at baseline.    Review of Systems  Psychiatric/Behavioral:  Positive for depression and suicidal ideas.   All other systems reviewed and are negative.  Blood pressure (!) 138/102, pulse 85, temperature 98 F (36.7 C), temperature source Oral, resp. rate 18, height 6\' 2"  (1.88 m), weight 123.6 kg, SpO2 99%. Body mass index is 34.97 kg/m.  Treatment Plan Summary: Daily contact with patient to assess and evaluate symptoms and progress in treatment and Medication management   ASSESSMENT:   Diagnoses / Active Problems: Bipolar disorder, current episode is depressed GAD Stimulant use disorder Alcohol use  disorder Sedative-hypnotic use disorder Opioid use disorder Tobacco use disorder   PLAN: Safety and Monitoring:             --  Voluntary admission to inpatient psychiatric unit for safety, stabilization and treatment             -- Daily contact with patient to assess and evaluate symptoms and progress in treatment             -- Patient's case to be discussed in multi-disciplinary team meeting             -- Observation Level : q15 minute checks             -- Vital signs:  q12 hours             --  Precautions: suicide, elopement, and assault   2. Psychiatric Diagnoses and Treatment:               -Resume medications that were prescribed on discharge on 04/15/2023. -These include the following: -Abilify Maintena 400 mg IM every 30 days.  Verify last dose given. -BuSpar 10 mg p.o. 3 times daily -Guanfacine 2 mg TD 24 ER tablet at bedtime -Minipress 1 mg at bedtime -Trazodone 50 mg at bedtime as needed -Remeron 30 mg at bedtime   --  The risks/benefits/side-effects/alternatives to this medication were discussed in detail with the patient and time was given for questions. The patient consents to medication trial.                -- Metabolic profile and EKG monitoring obtained while on an atypical antipsychotic (BMI: Lipid Panel: HbgA1c: QTc:)              -- Encouraged patient to participate in unit milieu and in scheduled group therapies              -- Short Term Goals: Ability to identify changes in lifestyle to reduce recurrence of condition will improve, Ability to verbalize feelings will improve, Ability to disclose and discuss suicidal ideas, Ability to demonstrate self-control will improve, Ability to identify and develop effective coping behaviors will improve, Ability to maintain clinical measurements within normal limits will improve, Compliance with prescribed medications will improve, and Ability to identify triggers associated with substance abuse/mental health issues will  improve             -- Long Term Goals: Improvement in symptoms so as ready for discharge                3. Medical Issues Being Addressed:              Tobacco Use Disorder             -- Nicotine patch 21mg /24 hours ordered plus nicotine gum PRN              -- Smoking cessation encouraged   4. Discharge Planning:              -- Social work and case management to assist with discharge planning and identification of hospital follow-up needs prior to discharge             -- Estimated LOS: 5-7 days with appropriate disposition and safety plan.             -- Discharge Concerns: Need to establish a safety plan; Medication compliance and effectiveness             -- Discharge Goals: Return home with outpatient referrals for mental health follow-up including medication management/psychotherapy   I certify that inpatient services furnished can reasonably be expected to improve the patient's condition.    Rex Kras, MD 10/23/20241:38 PM

## 2023-04-14 NOTE — Progress Notes (Signed)
Pt admitted to [facility] after discharge on 10/23. Pt states '' I went to the shelter and they wouldn't take me and I went to go back to my moms but she would not take me back so I had no place to stay. So I started having suicidal thoughts with a plan to cut my wrists but I went to the ER before I did anything.,''  Pt is depressed upon admission and reports that his main goal is to find housing or go to [facility]. Pt denies any current SI HI or AV Hallucinations. Pt skin assessed and pt reports tooth pain. Noted poor dentition. Pt skin unremarkable. Pt oriented to the unit. Pt is safe, given meal and snack. Will con't to monitor.

## 2023-04-14 NOTE — ED Notes (Signed)
Voluntary consent form signed and rider waiver signed by patient

## 2023-04-15 DIAGNOSIS — F1010 Alcohol abuse, uncomplicated: Secondary | ICD-10-CM | POA: Diagnosis not present

## 2023-04-15 NOTE — Progress Notes (Signed)
Cody Young, states, "I'm depressed and I don't know why they want to send me home" He states he has SI but denies HI/AVH.Appears sad Taking scheduled meds and cooperative Vital signs are stable and he voices no complaints

## 2023-04-15 NOTE — Progress Notes (Signed)
Psychiatric progress note  Patient Identification: Cody Young. MRN:  [id number] Date of Evaluation:  04/15/2023 Chief Complaint:  Alcohol abuse [F10.10] Principal Diagnosis: Alcohol abuse Diagnosis:  Principal Problem:   Alcohol abuse   Reason for admission   The patient is a 32 year old Caucasian male with a history of bipolar disorder and substance use disorder who was admitted with increasing depression and suicidal ideations with a plan to either slit his wrist or hanging himself.  He was recently discharged from this facility on 04/13/2023 with a safety plan which apparently fell through.  Chart review from last 24 hours   Staff reports that the patient claims that he slept poorly although they documented 8.5 hours.  He remains withdrawn and depressed.  He has been compliant with medications.  He received as needed Robaxin and hydroxyzine last night.  Yesterday, the psychiatry team made the following recommendations:  Resume home medications. This includes Abilify ER 400 mg IM every month BuSpar 10 mg p.o. 3 times daily Guanfacine 2 mg p.o. at bedtime Remeron 30 mg at bedtime Minipress 1 mg at bedtime. NicoDerm patch 21 mg transdermal daily Patient is also on Robaxin as needed Information obtained during interview   The patient was seen and evaluated.  He was laying in bed covered up when she is completely withdrawn and appears depressed and has significant psychomotor retardation.  He continues to endorse depressive symptoms and claims that he has ongoing suicidal ideations but denies any auditory or visual hallucinations.  He is taking the scheduled medications as prescribed.  He admits that his mother does not want him home and like to go to a rehab facility if possible.  He denies using any drugs but he was positive for cannabinoids on 04/13/2023.  There is also positive on the previous admission on 04/05/2023.  He claims that it was from the previous use and that he had  not used any recently. He continues to contract for safety but remains very withdrawn.  The plan is to continue current medications for stabilization and look at his safety plan with possibility for rehab facility if appropriate.  Associated Signs/Symptoms: Depression Symptoms:  psychomotor retardation, suicidal thoughts with specific plan, (Hypo) Manic Symptoms:  Impulsivity, Anxiety Symptoms:  Excessive Worry, Psychotic Symptoms:  Paranoia, PTSD Symptoms: Negative Total Time spent with patient: 30 minutes  Past Psychiatric History: The patient was admitted to [facility] behavioral health from 10/14 to 04/15/2023.  He returned back for the current admission within 24 hours.  He has multiple prior admissions with a diagnosis of bipolar disorder.  He reports using multiple medications including Abilify, Remeron, Cymbalta, prazosin, BuSpar.  He gives a history of at least 2 suicide attempts in the past.  Past medical history significant for history of OSA but he has not been tested for this.  He denies any history of seizures.  He denies any drug allergies.   Is the patient at risk to self? Yes.    Has the patient been a risk to self in the past 6 months? Yes.    Has the patient been a risk to self within the distant past? Yes.    Is the patient a risk to others? No.  Has the patient been a risk to others in the past 6 months? No.  Has the patient been a risk to others within the distant past? No.   [place] Scale:  Flowsheet Row [facility]) [facility] from  04/13/2023 in [facility] Department at [facility] Admission (Discharged) [facility]  C-SSRS RISK CATEGORY Low Risk High Risk No Risk        Prior Inpatient Therapy: Yes.   If yes, describe patient had multiple admissions in the past.  Recent discharge on 04/15/2023. Prior Outpatient Therapy: Yes.   If  yes, describe noncompliant with outpatient treatment.  Alcohol Screening: Patient refused Alcohol Screening Tool: Yes (''nothing since i left yesterday) 1. How often do you have a drink containing alcohol?: Never 2. How many drinks containing alcohol do you have on a typical day when you are drinking?: 1 or 2 3. How often do you have six or more drinks on one occasion?: Never AUDIT-C Score: 0 4. How often during the last year have you found that you were not able to stop drinking once you had started?: Never 5. How often during the last year have you failed to do what was normally expected from you because of drinking?: Never 6. How often during the last year have you needed a first drink in the morning to get yourself going after a heavy drinking session?: Never 7. How often during the last year have you had a feeling of guilt of remorse after drinking?: Never 8. How often during the last year have you been unable to remember what happened the night before because you had been drinking?: Never 9. Have you or someone else been injured as a result of your drinking?: No 10. Has a relative or friend or a doctor or another health worker been concerned about your drinking or suggested you cut down?: No Alcohol Use Disorder Identification Test Final Score (AUDIT): 0 Alcohol Brief Interventions/Follow-up: Alcohol education/Brief advice Substance Abuse History in the last 12 months:  Yes.   Consequences of Substance Abuse: Withdrawal Symptoms:   Cramps Diaphoresis Nausea Tremors Previous Psychotropic Medications: Yes  Psychological Evaluations: Yes  Past Medical History:  Past Medical History:  Diagnosis Date   Bipolar 1 disorder (HCC)    Hypertension    Medical history non-contributory    Mood and affect disturbance     Past Surgical History:  Procedure Laterality Date   BRAIN SURGERY     Family History: History reviewed. No pertinent family history. Family Psychiatric  History:  According to the records mother has a history of bipolar disorder and maternal grandmother has schizophrenia. Tobacco Screening:  Social History   Tobacco Use  Smoking Status Every Day   Current packs/day: 1.00   Average packs/day: 1 pack/day for 20.0 years (20.0 ttl pk-yrs)   Types: Cigarettes  Smokeless Tobacco Never    BH Tobacco Counseling     Are you interested in Tobacco Cessation Medications?  Yes, implement Nicotene Replacement Protocol Counseled patient on smoking cessation:  Refused/Declined practical counseling Reason Tobacco Screening Not Completed: Patient Refused Screening       Social History:  Social History   Substance and Sexual Activity  Alcohol Use Yes   Alcohol/week: 12.0 standard drinks of alcohol   Types: 12 Cans of beer per week   Comment: Currently drinks mad dogs     Social History   Substance and Sexual Activity  Drug Use Yes   Types: Marijuana, Cocaine, "Crack" cocaine   Comment: deneis current cocaine use, none since last admit    Additional Social History:  Allergies:  No Known Allergies Lab Results:  Results for orders placed or performed during the hospital encounter of 04/13/23 (from the past 48 hour(s))  Comprehensive metabolic panel     Status: Abnormal   Collection Time: 04/13/23  8:42 PM  Result Value Ref Range   Sodium 138 135 - 145 mmol/L   Potassium 3.7 3.5 - 5.1 mmol/L   Chloride 101 98 - 111 mmol/L   CO2 24 22 - 32 mmol/L   Glucose, Bld 108 (H) 70 - 99 mg/dL    Comment: Glucose reference range applies only to samples taken after fasting for at least 8 hours.   BUN 18 6 - 20 mg/dL   Creatinine, Ser 0.63 0.61 - 1.24 mg/dL   Calcium 9.7 8.9 - 01.6 mg/dL   Total Protein 7.8 6.5 - 8.1 g/dL   Albumin 4.8 3.5 - 5.0 g/dL   AST 24 15 - 41 U/L   ALT 36 0 - 44 U/L   Alkaline Phosphatase 50 38 - 126 U/L   Total Bilirubin 0.9 0.3 - 1.2 mg/dL   GFR, Estimated >01 >09 mL/min    Comment:  (NOTE) Calculated using the CKD-EPI Creatinine Equation (2021)    Anion gap 13 5 - 15    Comment: Performed at [facility], [address], [place], [place] [address]  Ethanol     Status: None   Collection Time: 04/13/23  8:42 PM  Result Value Ref Range   Alcohol, Ethyl (B) <10 <10 mg/dL    Comment: (NOTE) Lowest detectable limit for serum alcohol is 10 mg/dL.  For medical purposes only. Performed at [facility], [name] [place], [place] [address]   Salicylate level     Status: Abnormal   Collection Time: 04/13/23  8:42 PM  Result Value Ref Range   Salicylate Lvl <7.0 (L) 7.0 - 30.0 mg/dL    Comment: Performed at [facility], [address] [place], [place] [address]  Acetaminophen level     Status: Abnormal   Collection Time: 04/13/23  8:42 PM  Result Value Ref Range   Acetaminophen (Tylenol), Serum <10 (L) 10 - 30 ug/mL    Comment: (NOTE) Therapeutic concentrations vary significantly. A range of 10-30 ug/mL  may be an effective concentration for many patients. However, some  are best treated at concentrations outside of this range. Acetaminophen concentrations >150 ug/mL at 4 hours after ingestion  and >50 ug/mL at 12 hours after ingestion are often associated with  toxic reactions.  Performed at [facility], [address] [place], [place] [address]   cbc     Status: Abnormal   Collection Time: 04/13/23  8:42 PM  Result Value Ref Range   WBC 11.4 (H) 4.0 - 10.5 K/uL   RBC 5.25 4.22 - 5.81 MIL/uL   Hemoglobin 15.8 13.0 - 17.0 g/dL   HCT 62.8 31.5 - 17.6 %   MCV 90.5 80.0 - 100.0 fL   MCH 30.1 26.0 - 34.0 pg   MCHC 33.3 30.0 - 36.0 g/dL   RDW 16.0 73.7 - 10.6 %   Platelets 244 150 - 400 K/uL   nRBC 0.0 0.0 - 0.2 %    Comment: Performed at [facility], [address], [place], [place] [address]  Urine Drug Screen, Qualitative     Status: Abnormal   Collection Time: 04/13/23  8:42 PM  Result Value Ref  Range   Tricyclic, Ur Screen NONE DETECTED NONE DETECTED  Amphetamines, Ur Screen NONE DETECTED NONE DETECTED   MDMA (Ecstasy)Ur Screen NONE DETECTED NONE DETECTED   Cocaine Metabolite,Ur [place] NONE DETECTED NONE DETECTED   Opiate, Ur Screen NONE DETECTED NONE DETECTED   Phencyclidine (PCP) Ur S NONE DETECTED NONE DETECTED   Cannabinoid 50 Ng, Ur [employer] POSITIVE (A) NONE DETECTED   Barbiturates, Ur Screen NONE DETECTED NONE DETECTED   Benzodiazepine, Ur Scrn NONE DETECTED NONE DETECTED   Methadone Scn, Ur NONE DETECTED NONE DETECTED    Comment: (NOTE) Tricyclics + metabolites, urine    Cutoff 1000 ng/mL Amphetamines + metabolites, urine  Cutoff 1000 ng/mL MDMA (Ecstasy), urine              Cutoff 500 ng/mL Cocaine Metabolite, urine          Cutoff 300 ng/mL Opiate + metabolites, urine        Cutoff 300 ng/mL Phencyclidine (PCP), urine         Cutoff 25 ng/mL Cannabinoid, urine                 Cutoff 50 ng/mL Barbiturates + metabolites, urine  Cutoff 200 ng/mL Benzodiazepine, urine              Cutoff 200 ng/mL Methadone, urine                   Cutoff 300 ng/mL  The urine drug screen provides only a preliminary, unconfirmed analytical test result and should not be used for non-medical purposes. Clinical consideration and professional judgment should be applied to any positive drug screen result due to possible interfering substances. A more specific alternate chemical method must be used in order to obtain a confirmed analytical result. Gas chromatography / mass spectrometry (GC/MS) is the preferred confirm atory method. Performed at [facility], [address] [place], [place] [address]     Blood Alcohol level:  Lab Results  Component Value Date   [facility] <10 04/13/2023   ETH <10 04/04/2023    Metabolic Disorder Labs:  Lab Results  Component Value Date   HGBA1C 5.5 04/06/2023   MPG 111.15 04/06/2023   MPG 103 09/02/2022   No results found for: "PROLACTIN" Lab  Results  Component Value Date   CHOL 166 04/06/2023   TRIG 96 04/06/2023   HDL 34 (L) 04/06/2023   CHOLHDL 4.9 04/06/2023   VLDL 19 04/06/2023   LDLCALC 113 (H) 04/06/2023   LDLCALC 132 (H) 09/02/2022    Current Medications: Current Facility-Administered Medications  Medication Dose Route Frequency Provider Last Rate Last Admin   acetaminophen (TYLENOL) tablet 650 mg  650 mg Oral Q6H PRN Sindy Guadeloupe, NP   650 mg at 04/14/23 1555   alum & mag hydroxide-simeth (MAALOX/MYLANTA) 200-200-20 MG/5ML suspension 30 mL  30 mL Oral Q4H PRN Sindy Guadeloupe, NP       Melene Muller ON 04/17/2023] ARIPiprazole ER (ABILIFY MAINTENA) 400 MG prefilled syringe 400 mg  400 mg Intramuscular Q30 days Rex Kras, MD       busPIRone (BUSPAR) tablet 10 mg  10 mg Oral TID Rex Kras, MD   10 mg at 04/15/23 0932   guanFACINE (INTUNIV) ER tablet 2 mg  2 mg Oral QHS Rex Kras, MD   2 mg at 04/14/23 2124   hydrOXYzine (ATARAX) tablet 25 mg  25 mg Oral TID PRN Rex Kras, MD   25 mg at 04/14/23 1836   OLANZapine zydis (ZYPREXA) disintegrating tablet 5 mg  5 mg Oral Q8H PRN Mayford Knife,  Channing Mutters, NP       And   LORazepam (ATIVAN) tablet 1 mg  1 mg Oral PRN Sindy Guadeloupe, NP       And   ziprasidone (GEODON) injection 20 mg  20 mg Intramuscular PRN Sindy Guadeloupe, NP       magnesium hydroxide (MILK OF MAGNESIA) suspension 30 mL  30 mL Oral Daily PRN Sindy Guadeloupe, NP       methocarbamol (ROBAXIN) tablet 500 mg  500 mg Oral Q6H PRN Massengill, Harrold Donath, MD   500 mg at 04/14/23 1723   mirtazapine (REMERON) tablet 30 mg  30 mg Oral QHS Rex Kras, MD   30 mg at 04/14/23 2124   multivitamin with minerals tablet 1 tablet  1 tablet Oral Daily Rex Kras, MD   1 tablet at 04/15/23 0931   nicotine (NICODERM CQ - dosed in mg/24 hours) patch 21 mg  21 mg Transdermal Daily Rex Kras, MD   21 mg at 04/15/23 0931   pantoprazole (PROTONIX) EC tablet 40 mg  40 mg Oral Daily Rex Kras, MD   40 mg at  04/15/23 0932   prazosin (MINIPRESS) capsule 1 mg  1 mg Oral QHS Rex Kras, MD   1 mg at 04/14/23 2124   traZODone (DESYREL) tablet 50 mg  50 mg Oral QHS PRN Rex Kras, MD       PTA Medications: Medications Prior to Admission  Medication Sig Dispense Refill Last Dose   ARIPiprazole ER (ABILIFY MAINTENA) 400 MG PRSY prefilled syringe Inject 400 mg into the muscle every 30 (thirty) days.      busPIRone (BUSPAR) 10 MG tablet Take 1 tablet (10 mg total) by mouth 3 (three) times daily. 90 tablet 0    guanFACINE (INTUNIV) 2 MG TB24 ER tablet Take 2 mg by mouth at bedtime.      hydrOXYzine (ATARAX) 25 MG tablet Take 1 tablet (25 mg total) by mouth 3 (three) times daily as needed for anxiety. 30 tablet 0    mirtazapine (REMERON) 30 MG tablet Take 1 tablet (30 mg total) by mouth at bedtime. 30 tablet 0    Multiple Vitamin (MULTIVITAMIN WITH MINERALS) TABS tablet Take 1 tablet by mouth daily.      nicotine (NICODERM CQ - DOSED IN MG/24 HOURS) 21 mg/24hr patch Place 1 patch (21 mg total) onto the skin daily. 28 patch 0    oxyCODONE (OXY IR/ROXICODONE) 5 MG immediate release tablet Take 5 mg by mouth every 4 (four) hours as needed.      pantoprazole (PROTONIX) 40 MG tablet Take 1 tablet (40 mg total) by mouth daily. 30 tablet 0    prazosin (MINIPRESS) 1 MG capsule Take 1 capsule (1 mg total) by mouth at bedtime. 30 capsule 0    traZODone (DESYREL) 50 MG tablet Take 1 tablet (50 mg total) by mouth at bedtime as needed for sleep. 30 tablet 0     Musculoskeletal: Strength & Muscle Tone: within normal limits Gait & Station: normal Patient leans: N/A            Psychiatric Specialty Exam:  Presentation  General Appearance:  Casual; Disheveled  Eye Contact: Fair  Speech: Clear and Coherent  Speech Volume: Decreased  Handedness: Right   Mood and Affect  Mood: Depressed  Affect: Depressed; Constricted   Thought Process  Thought Processes: Coherent  Duration  of Psychotic Symptoms:N/A Past Diagnosis of Schizophrenia or Psychoactive disorder: No  Descriptions of Associations:Intact  Orientation:Full (Time, Place and Person)  Thought Content:Rumination  Hallucinations:Hallucinations: None  Ideas of Reference:None  Suicidal Thoughts:Suicidal Thoughts: Yes, Passive SI Active Intent and/or Plan: With Intent; With Plan  Homicidal Thoughts:Homicidal Thoughts: No   Sensorium  Memory: Immediate Fair; Recent Fair; Remote Fair  Judgment: Poor  Insight: Fair   Chartered certified accountant: Fair  Attention Span: Fair  Recall: [employer] of Knowledge: Fair  Language: Fair   Psychomotor Activity  Psychomotor Activity: Psychomotor Activity: Normal   Assets  Assets: Manufacturing systems engineer; Desire for Improvement   Sleep  Sleep: Sleep: Good Number of Hours of Sleep: 8.5    Physical Exam: Physical Exam Constitutional:      Appearance: Normal appearance.  Neurological:     General: No focal deficit present.     Mental Status: He is alert and oriented to person, place, and time. Mental status is at baseline.    Review of Systems  Psychiatric/Behavioral:  Positive for depression and suicidal ideas.   All other systems reviewed and are negative.  Blood pressure 136/88, pulse 94, temperature 98 F (36.7 C), temperature source Oral, resp. rate 18, height 6\' 2"  (1.88 m), weight 123.6 kg, SpO2 94%. Body mass index is 34.97 kg/m.  Treatment Plan Summary: Daily contact with patient to assess and evaluate symptoms and progress in treatment and Medication management   ASSESSMENT:   Diagnoses / Active Problems: Bipolar disorder, current episode is depressed GAD Stimulant use disorder Alcohol use disorder Sedative-hypnotic use disorder Opioid use disorder Tobacco use disorder   PLAN: Safety and Monitoring:             --  Voluntary admission to inpatient psychiatric unit for safety, stabilization and  treatment             -- Daily contact with patient to assess and evaluate symptoms and progress in treatment             -- Patient's case to be discussed in multi-disciplinary team meeting             -- Observation Level : q15 minute checks             -- Vital signs:  q12 hours             -- Precautions: suicide, elopement, and assault   2. Psychiatric Diagnoses and Treatment:               -Resume medications that were prescribed on discharge on 04/15/2023. -These include the following: -Abilify Maintena 400 mg IM every 30 days.  Verify last dose given. -BuSpar 10 mg p.o. 3 times daily -Guanfacine 2 mg TD 24 ER tablet at bedtime -Minipress 1 mg at bedtime -Trazodone 50 mg at bedtime as needed -Remeron 30 mg at bedtime   --  The risks/benefits/side-effects/alternatives to this medication were discussed in detail with the patient and time was given for questions. The patient consents to medication trial.                -- Metabolic profile and EKG monitoring obtained while on an atypical antipsychotic (BMI: Lipid Panel: HbgA1c: QTc:)              -- Encouraged patient to participate in unit milieu and in scheduled group therapies              -- Short Term Goals: Ability to identify changes in lifestyle to reduce recurrence of condition will improve, Ability to verbalize feelings will improve, Ability to disclose and discuss suicidal  ideas, Ability to demonstrate self-control will improve, Ability to identify and develop effective coping behaviors will improve, Ability to maintain clinical measurements within normal limits will improve, Compliance with prescribed medications will improve, and Ability to identify triggers associated with substance abuse/mental health issues will improve             -- Long Term Goals: Improvement in symptoms so as ready for discharge                3. Medical Issues Being Addressed:              Tobacco Use Disorder             -- Nicotine patch  21mg /24 hours ordered plus nicotine gum PRN              -- Smoking cessation encouraged   4. Discharge Planning:              -- Social work and case management to assist with discharge planning and identification of hospital follow-up needs prior to discharge             -- Estimated LOS: 5-7 days with appropriate disposition and safety plan.             -- Discharge Concerns: Need to establish a safety plan; Medication compliance and effectiveness             -- Discharge Goals: Return home with outpatient referrals for mental health follow-up including medication management/psychotherapy   I certify that inpatient services furnished can reasonably be expected to improve the patient's condition.    Rex Kras, MD 10/24/202411:10 AM Patient ID: Colin Broach., male   DOB: 07-11-1990, 32 y.o.   MRN: [id number]

## 2023-04-15 NOTE — Plan of Care (Signed)

## 2023-04-15 NOTE — Group Note (Signed)
LCSW Group Therapy Note   Group Date: 04/15/2023 Start Time: 1100 End Time: 1200   Type of Therapy and Topic:  Group Therapy: Challenging Core Beliefs  Participation Level:  Did Not Attend  Description of Group:  Patients were educated about core beliefs and asked to identify one harmful core belief that they have. Patients were asked to explore from where those beliefs originate. Patients were asked to discuss how those beliefs make them feel and the resulting behaviors of those beliefs. They were then be asked if those beliefs are true and, if so, what evidence they have to support them. Lastly, group members were challenged to replace those negative core beliefs with helpful beliefs.   Therapeutic Goals:   1. Patient will identify harmful core beliefs and explore the origins of such beliefs. 2. Patient will identify feelings and behaviors that result from those core beliefs. 3. Patient will discuss whether such beliefs are true. 4.  Patient will replace harmful core beliefs with helpful ones.  Summary of Patient Progress:  Did not attend group.  Therapeutic Modalities: Cognitive Behavioral Therapy; Solution-Focused Therapy   Kathi Der, LCSWA 04/15/2023  12:18 PM

## 2023-04-15 NOTE — Progress Notes (Signed)
[facility] LCSW Progress Note   04/15/2023    3:10 PM  Cody Young.      Type of Note: Attempt to Call Mom   CSW tried to call patient mom but was sent to voicemail numerous times. Each time CSW would call patient mom it would ring 2-4 times and then go to voicemail. CSW will continue to assist.     Signed:   Jacob Moores, MSW, LCSWA 04/15/2023 3:10 PM

## 2023-04-15 NOTE — Progress Notes (Signed)
Adult Psychoeducational Group Note  Date:  04/15/2023 Time:  9:57 PM  Group Topic/Focus:  Wrap-Up Group:   The focus of this group is to help patients review their daily goal of treatment and discuss progress on daily workbooks.  Participation Level:  Active  Participation Quality:  Appropriate  Affect:  Appropriate  Cognitive:  Appropriate  Insight: Appropriate  Engagement in Group:  Engaged  Modes of Intervention:  Discussion  Additional Comments:  Pt stated he did not have a good day.  Pt did not have a goal for the day. Pt stated he was still feeling suicidal but was able to contract for safety.  Wynema Birch D 04/15/2023, 9:57 PM
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
## 2023-04-15 NOTE — Plan of Care (Signed)
  Problem: Education: Goal: Knowledge of [facility] Education information/materials will improve Outcome: Not Progressing Goal: Emotional status will improve Outcome: Not Progressing Goal: Mental status will improve Outcome: Not Progressing Goal: Verbalization of understanding the information provided will improve Outcome: Not Progressing   Problem: Activity: Goal: Interest or engagement in activities will improve Outcome: Not Progressing Goal: Sleeping patterns will improve Outcome: Not Progressing   Problem: Coping: Goal: Ability to verbalize frustrations and anger appropriately will improve Outcome: Not Progressing Goal: Ability to demonstrate self-control will improve Outcome: Not Progressing   Problem: Health Behavior/Discharge Planning: Goal: Identification of resources available to assist in meeting health care needs will improve Outcome: Not Progressing Goal: Compliance with treatment plan for underlying cause of condition will improve Outcome: Not Progressing   Problem: Physical Regulation: Goal: Ability to maintain clinical measurements within normal limits will improve Outcome: Not Progressing

## 2023-04-16 ENCOUNTER — Encounter (HOSPITAL_COMMUNITY): Payer: Self-pay

## 2023-04-16 DIAGNOSIS — F1010 Alcohol abuse, uncomplicated: Secondary | ICD-10-CM | POA: Diagnosis not present

## 2023-04-16 NOTE — Progress Notes (Signed)
   04/15/23 2100  Psych Admission Type (Psych Patients Only)  Admission Status Voluntary  Psychosocial Assessment  Patient Complaints Depression  Eye Contact Fair  Facial Expression Animated  Affect Appropriate to circumstance  Speech Logical/coherent  Interaction Assertive  Motor Activity Slow  Appearance/Hygiene In scrubs  Behavior Characteristics Cooperative;Appropriate to situation  Mood Depressed;Anxious  Thought Process  Coherency WDL  Content WDL  Delusions None reported or observed  Perception Hallucinations  Hallucination Auditory;Visual  Judgment Poor  Confusion None  Danger to Self  Current suicidal ideation? Verbalizes  Agreement Not to Harm Self Yes  Description of Agreement verbal  Danger to Others  Danger to Others None reported or observed

## 2023-04-16 NOTE — Group Note (Signed)
Recreation Therapy Group Note   Group Topic:Team Building  Group Date: 04/16/2023 Start Time: 0935 End Time: 0955 Facilitators: Lois Ostrom-McCall, LRT,CTRS Location: [address] Dayroom   Group Topic: Team Building, Problem Solving  Goal Area(s) Addresses:  Patient will effectively work with peer towards shared goal.  Patient will identify skills used to make activity successful.  Patient will identify how skills used during activity can be applied to reach post d/c goals.   Intervention: STEM Activity- Glass blower/designer  Group Description: Tallest Pharmacist, community. In teams of 5-6, patients were given 11 craft pipe cleaners. Using the materials provided, patients were instructed to compete again the opposing team(s) to build the tallest free-standing structure from floor level. The activity was timed; difficulty increased by Clinical research associate as Production designer, theatre/television/film continued.  Systematically resources were removed with additional directions for example, placing one arm behind their back, working in silence, and shape stipulations. LRT facilitated post-activity discussion reviewing team processes and necessary communication skills involved in completion. Patients were encouraged to reflect how the skills utilized, or not utilized, in this activity can be incorporated to positively impact support systems post discharge.  Education: Pharmacist, community, Scientist, physiological, Discharge Planning   Education Outcome: Acknowledges education/In group clarification offered/Needs additional education.    Affect/Mood: Flat   Participation Level: Minimal   Participation Quality: Independent   Behavior: Appropriate   Speech/Thought Process: Directed   Insight: Moderate   Judgement: Moderate   Modes of Intervention: STEM Activity   Patient Response to Interventions:  Attentive   Education Outcome:  In group clarification offered    Clinical Observations/Individualized Feedback: Pt was attentive and  more observant during group session.    Plan: Continue to engage patient in RT group sessions 2-3x/week.   Cody Young, LRT,CTRS  04/16/2023 12:25 PM

## 2023-04-16 NOTE — BH IP Treatment Plan (Signed)
Interdisciplinary Treatment and Diagnostic Plan Update  04/16/2023 Time of Session: 10:55 AM  Cody Young. MRN: [id number]  Principal Diagnosis: Alcohol abuse  Secondary Diagnoses: Principal Problem:   Alcohol abuse   Current Medications:  Current Facility-Administered Medications  Medication Dose Route Frequency Provider Last Rate Last Admin   acetaminophen (TYLENOL) tablet 650 mg  650 mg Oral Q6H PRN Sindy Guadeloupe, NP   650 mg at 04/14/23 1555   alum & mag hydroxide-simeth (MAALOX/MYLANTA) 200-200-20 MG/5ML suspension 30 mL  30 mL Oral Q4H PRN Sindy Guadeloupe, NP       Melene Muller ON 04/17/2023] ARIPiprazole ER (ABILIFY MAINTENA) 400 MG prefilled syringe 400 mg  400 mg Intramuscular Q30 days Rex Kras, MD       busPIRone (BUSPAR) tablet 10 mg  10 mg Oral TID Rex Kras, MD   10 mg at 04/16/23 1144   guanFACINE (INTUNIV) ER tablet 2 mg  2 mg Oral QHS Rex Kras, MD   2 mg at 04/15/23 2143   hydrOXYzine (ATARAX) tablet 25 mg  25 mg Oral TID PRN Rex Kras, MD   25 mg at 04/16/23 1255   OLANZapine zydis (ZYPREXA) disintegrating tablet 5 mg  5 mg Oral Q8H PRN Sindy Guadeloupe, NP       And   LORazepam (ATIVAN) tablet 1 mg  1 mg Oral PRN Sindy Guadeloupe, NP       And   ziprasidone (GEODON) injection 20 mg  20 mg Intramuscular PRN Sindy Guadeloupe, NP       magnesium hydroxide (MILK OF MAGNESIA) suspension 30 mL  30 mL Oral Daily PRN Sindy Guadeloupe, NP       methocarbamol (ROBAXIN) tablet 500 mg  500 mg Oral Q6H PRN Massengill, Harrold Donath, MD   500 mg at 04/16/23 0935   mirtazapine (REMERON) tablet 30 mg  30 mg Oral QHS Rex Kras, MD   30 mg at 04/15/23 2143   multivitamin with minerals tablet 1 tablet  1 tablet Oral Daily Rex Kras, MD   1 tablet at 04/16/23 1324   nicotine (NICODERM CQ - dosed in mg/24 hours) patch 21 mg  21 mg Transdermal Daily Rex Kras, MD   21 mg at 04/16/23 0811   pantoprazole (PROTONIX) EC tablet 40 mg  40 mg Oral Daily Rex Kras, MD   40 mg at 04/16/23 4010   prazosin (MINIPRESS) capsule 1 mg  1 mg Oral QHS Rex Kras, MD   1 mg at 04/15/23 2142   traZODone (DESYREL) tablet 50 mg  50 mg Oral QHS PRN Rex Kras, MD       PTA Medications: Medications Prior to Admission  Medication Sig Dispense Refill Last Dose   ARIPiprazole ER (ABILIFY MAINTENA) 400 MG PRSY prefilled syringe Inject 400 mg into the muscle every 30 (thirty) days.      busPIRone (BUSPAR) 10 MG tablet Take 1 tablet (10 mg total) by mouth 3 (three) times daily. 90 tablet 0    guanFACINE (INTUNIV) 2 MG TB24 ER tablet Take 2 mg by mouth at bedtime.      hydrOXYzine (ATARAX) 25 MG tablet Take 1 tablet (25 mg total) by mouth 3 (three) times daily as needed for anxiety. 30 tablet 0    mirtazapine (REMERON) 30 MG tablet Take 1 tablet (30 mg total) by mouth at bedtime. 30 tablet 0    Multiple Vitamin (MULTIVITAMIN WITH MINERALS) TABS tablet Take 1 tablet by mouth daily.      nicotine (NICODERM CQ -  DOSED IN MG/24 HOURS) 21 mg/24hr patch Place 1 patch (21 mg total) onto the skin daily. 28 patch 0    oxyCODONE (OXY IR/ROXICODONE) 5 MG immediate release tablet Take 5 mg by mouth every 4 (four) hours as needed.      pantoprazole (PROTONIX) 40 MG tablet Take 1 tablet (40 mg total) by mouth daily. 30 tablet 0    prazosin (MINIPRESS) 1 MG capsule Take 1 capsule (1 mg total) by mouth at bedtime. 30 capsule 0    traZODone (DESYREL) 50 MG tablet Take 1 tablet (50 mg total) by mouth at bedtime as needed for sleep. 30 tablet 0     Patient Stressors:    Patient Strengths:    Treatment Modalities: Medication Management, Group therapy, Case management,  1 to 1 session with clinician, Psychoeducation, Recreational therapy.   Physician Treatment Plan for Primary Diagnosis: Alcohol abuse Long Term Goal(s):     Short Term Goals:    Medication Management: Evaluate patient's response, side effects, and tolerance of medication regimen.  Therapeutic  Interventions: 1 to 1 sessions, Unit Group sessions and Medication administration.  Evaluation of Outcomes: Not Progressing  Physician Treatment Plan for Secondary Diagnosis: Principal Problem:   Alcohol abuse  Long Term Goal(s):     Short Term Goals:       Medication Management: Evaluate patient's response, side effects, and tolerance of medication regimen.  Therapeutic Interventions: 1 to 1 sessions, Unit Group sessions and Medication administration.  Evaluation of Outcomes: Not Progressing   RN Treatment Plan for Primary Diagnosis: Alcohol abuse Long Term Goal(s): Knowledge of disease and therapeutic regimen to maintain health will improve  Short Term Goals: Ability to remain free from injury will improve, Ability to verbalize frustration and anger appropriately will improve, Ability to demonstrate self-control, Ability to participate in decision making will improve, Ability to verbalize feelings will improve, Ability to disclose and discuss suicidal ideas, Ability to identify and develop effective coping behaviors will improve, and Compliance with prescribed medications will improve  Medication Management: RN will administer medications as ordered by provider, will assess and evaluate patient's response and provide education to patient for prescribed medication. RN will report any adverse and/or side effects to prescribing provider.  Therapeutic Interventions: 1 on 1 counseling sessions, Psychoeducation, Medication administration, Evaluate responses to treatment, Monitor vital signs and CBGs as ordered, Perform/monitor CIWA, COWS, AIMS and Fall Risk screenings as ordered, Perform wound care treatments as ordered.  Evaluation of Outcomes: Not Progressing   LCSW Treatment Plan for Primary Diagnosis: Alcohol abuse Long Term Goal(s): Safe transition to appropriate next level of care at discharge, Engage patient in therapeutic group addressing interpersonal concerns.  Short Term  Goals: Engage patient in aftercare planning with referrals and resources, Increase social support, Increase ability to appropriately verbalize feelings, Increase emotional regulation, Facilitate acceptance of mental health diagnosis and concerns, Facilitate patient progression through stages of change regarding substance use diagnoses and concerns, Identify triggers associated with mental health/substance abuse issues, and Increase skills for wellness and recovery  Therapeutic Interventions: Assess for all discharge needs, 1 to 1 time with Social worker, Explore available resources and support systems, Assess for adequacy in community support network, Educate family and significant other(s) on suicide prevention, Complete Psychosocial Assessment, Interpersonal group therapy.  Evaluation of Outcomes: Not Progressing   Progress in Treatment: Attending groups: Yes. Participating in groups: Yes. Taking medication as prescribed: Yes. Toleration medication: Yes. Family/Significant other contact made: No, will contact:  Cody Young ( mom ) [phone]  Patient understands diagnosis: Yes. Discussing patient identified problems/goals with staff: Yes. Medical problems stabilized or resolved: Yes. Denies suicidal/homicidal ideation: Yes. Issues/concerns per patient self-inventory: No.   New problem(s) identified: No, Describe:  None reported   New Short Term/Long Term Goal(s):  medication stabilization, elimination of SI thoughts, development of comprehensive mental wellness plan.    Patient Goals:  " Stop having SI thoughts and get into a rehab "  Discharge Plan or Barriers: Patient recently admitted. CSW will continue to follow and assess for appropriate referrals and possible discharge planning.    Reason for Continuation of Hospitalization: Depression Medication stabilization Suicidal ideation Other; describe Homeless   Estimated Length of Stay: 5-7 days   Last 3 [place] Suicide  Severity Risk Score: Flowsheet Row [facility]Discharged[facility]  C-SSRS RISK CATEGORY Low Risk High Risk No Risk       Last PHQ 2/9 Scores:     No data to display          Scribe for Treatment Team: Beather Arbour 04/16/2023 2:39 PM

## 2023-04-16 NOTE — Progress Notes (Signed)
Psychiatric progress note  Patient Identification: Cody Young. MRN:  [id number] Date of Evaluation:  04/16/2023 Chief Complaint:  Alcohol abuse [F10.10] Principal Diagnosis: Alcohol abuse Diagnosis:  Principal Problem:   Alcohol abuse   Reason for admission   The patient is a 32 year old Caucasian male with a history of bipolar disorder and substance use disorder who was admitted with increasing depression and suicidal ideations with a plan to either slit his wrist or hanging himself.  He was recently discharged from this facility on 04/13/2023 with a safety plan which apparently fell through.  Chart review from last 24 hours   Staff reports that the patient remains isolated to and withdrawn.  He continues to endorse depression and passive suicidal ideations.  He claims that he slept poorly however staff reports 7.75 hours.  He is compliant with medications.  He received Robaxin for muscle spasms and hydroxyzine for anxiety as a as needed.  Yesterday, the psychiatry team made the following recommendations:  Resume home medications. Abilify ER 400 mg IM every month BuSpar 10 mg p.o. 3 times daily Guanfacine 2 mg p.o. at bedtime Remeron 30 mg at bedtime Minipress 1 mg at bedtime. NicoDerm patch 21 mg transdermal daily Patient is also on Robaxin as needed Information obtained during interview   The patient was seen and evaluated.  He also attended treatment team today.  He appears withdrawn and disheveled.  He maintains fair eye contact.  He has psychomotor retardation.  His speech is coherent with significant latency and he continues to endorse suicidal ideations but denies any auditory or visual hallucinations.  He claims that" if I had a rope I would hang myself" but at the same time is able to contract for safety while in the hospital.  He was strongly encouraged to attend groups and take the medications as prescribed.  He reports that his goal of this hospitalization is to stop  having suicidal thoughts and go to rehab.  He was encouraged to continue taking his medication as prescribed and to contact [facility] [place].  Associated Signs/Symptoms: Depression Symptoms:  psychomotor retardation, suicidal thoughts with specific plan, (Hypo) Manic Symptoms:  Impulsivity, Anxiety Symptoms:  Excessive Worry, Psychotic Symptoms:  Paranoia, PTSD Symptoms: Negative Total Time spent with patient: 30 minutes  Past Psychiatric History: The patient was admitted to [facility] behavioral health from 10/14 to 04/15/2023.  He returned back for the current admission within 24 hours.  He has multiple prior admissions with a diagnosis of bipolar disorder.  He reports using multiple medications including Abilify, Remeron, Cymbalta, prazosin, BuSpar.  He gives a history of at least 2 suicide attempts in the past.  Past medical history significant for history of OSA but he has not been tested for this.  He denies any history of seizures.  He denies any drug allergies.   Is the patient at risk to self? Yes.    Has the patient been a risk to self in the past 6 months? Yes.    Has the patient been a risk to self within the distant past? Yes.    Is the patient a risk to others? No.  Has the patient been a risk to others in the past 6 months? No.  Has the patient been a risk to others within the distant past? No.   [place] Scale:  Flowsheet Row [facility]) [facility] from 04/13/2023 in [facility] Admission (Discharged)  [facility]  C-SSRS RISK CATEGORY Low Risk High Risk No Risk        Prior Inpatient Therapy: Yes.   If yes, describe patient had multiple admissions in the past.  Recent discharge on 04/15/2023. Prior Outpatient Therapy: Yes.   If yes, describe noncompliant with outpatient treatment.  Alcohol Screening:  Patient refused Alcohol Screening Tool: Yes (''nothing since i left yesterday) 1. How often do you have a drink containing alcohol?: Never 2. How many drinks containing alcohol do you have on a typical day when you are drinking?: 1 or 2 3. How often do you have six or more drinks on one occasion?: Never AUDIT-C Score: 0 4. How often during the last year have you found that you were not able to stop drinking once you had started?: Never 5. How often during the last year have you failed to do what was normally expected from you because of drinking?: Never 6. How often during the last year have you needed a first drink in the morning to get yourself going after a heavy drinking session?: Never 7. How often during the last year have you had a feeling of guilt of remorse after drinking?: Never 8. How often during the last year have you been unable to remember what happened the night before because you had been drinking?: Never 9. Have you or someone else been injured as a result of your drinking?: No 10. Has a relative or friend or a doctor or another health worker been concerned about your drinking or suggested you cut down?: No Alcohol Use Disorder Identification Test Final Score (AUDIT): 0 Alcohol Brief Interventions/Follow-up: Alcohol education/Brief advice Substance Abuse History in the last 12 months:  Yes.   Consequences of Substance Abuse: Withdrawal Symptoms:   Cramps Diaphoresis Nausea Tremors Previous Psychotropic Medications: Yes  Psychological Evaluations: Yes  Past Medical History:  Past Medical History:  Diagnosis Date   Bipolar 1 disorder (HCC)    Hypertension    Medical history non-contributory    Mood and affect disturbance     Past Surgical History:  Procedure Laterality Date   BRAIN SURGERY     Family History: History reviewed. No pertinent family history. Family Psychiatric  History: According to the records mother has a history of bipolar disorder and maternal  grandmother has schizophrenia. Tobacco Screening:  Social History   Tobacco Use  Smoking Status Every Day   Current packs/day: 1.00   Average packs/day: 1 pack/day for 20.0 years (20.0 ttl pk-yrs)   Types: Cigarettes  Smokeless Tobacco Never    BH Tobacco Counseling     Are you interested in Tobacco Cessation Medications?  Yes, implement Nicotene Replacement Protocol Counseled patient on smoking cessation:  Refused/Declined practical counseling Reason Tobacco Screening Not Completed: Patient Refused Screening       Social History:  Social History   Substance and Sexual Activity  Alcohol Use Yes   Alcohol/week: 12.0 standard drinks of alcohol   Types: 12 Cans of beer per week   Comment: Currently drinks mad dogs     Social History   Substance and Sexual Activity  Drug Use Yes   Types: Marijuana, Cocaine, "Crack" cocaine   Comment: deneis current cocaine use, none since last admit    Additional Social History: Marital status: Single Are you sexually active?: No What is your sexual orientation?: Heterosexual Has your sexual activity been affected by drugs, alcohol, medication, or emotional stress?: None reported Does patient  have children?: No                         Allergies:  No Known Allergies Lab Results:  No results found for this or any previous visit (from the past 48 hour(s)).   Blood Alcohol level:  Lab Results  Component Value Date   ETH <10 04/13/2023   ETH <10 04/04/2023    Metabolic Disorder Labs:  Lab Results  Component Value Date   HGBA1C 5.5 04/06/2023   MPG 111.15 04/06/2023   MPG 103 09/02/2022   No results found for: "PROLACTIN" Lab Results  Component Value Date   CHOL 166 04/06/2023   TRIG 96 04/06/2023   HDL 34 (L) 04/06/2023   CHOLHDL 4.9 04/06/2023   VLDL 19 04/06/2023   LDLCALC 113 (H) 04/06/2023   LDLCALC 132 (H) 09/02/2022    Current Medications: Current Facility-Administered Medications  Medication Dose  Route Frequency Provider Last Rate Last Admin   acetaminophen (TYLENOL) tablet 650 mg  650 mg Oral Q6H PRN Sindy Guadeloupe, NP   650 mg at 04/14/23 1555   alum & mag hydroxide-simeth (MAALOX/MYLANTA) 200-200-20 MG/5ML suspension 30 mL  30 mL Oral Q4H PRN Sindy Guadeloupe, NP       Melene Muller ON 04/17/2023] ARIPiprazole ER (ABILIFY MAINTENA) 400 MG prefilled syringe 400 mg  400 mg Intramuscular Q30 days Rex Kras, MD       busPIRone (BUSPAR) tablet 10 mg  10 mg Oral TID Rex Kras, MD   10 mg at 04/16/23 8413   guanFACINE (INTUNIV) ER tablet 2 mg  2 mg Oral QHS Rex Kras, MD   2 mg at 04/15/23 2143   hydrOXYzine (ATARAX) tablet 25 mg  25 mg Oral TID PRN Rex Kras, MD   25 mg at 04/15/23 2143   OLANZapine zydis (ZYPREXA) disintegrating tablet 5 mg  5 mg Oral Q8H PRN Sindy Guadeloupe, NP       And   LORazepam (ATIVAN) tablet 1 mg  1 mg Oral PRN Sindy Guadeloupe, NP       And   ziprasidone (GEODON) injection 20 mg  20 mg Intramuscular PRN Sindy Guadeloupe, NP       magnesium hydroxide (MILK OF MAGNESIA) suspension 30 mL  30 mL Oral Daily PRN Sindy Guadeloupe, NP       methocarbamol (ROBAXIN) tablet 500 mg  500 mg Oral Q6H PRN Massengill, Harrold Donath, MD   500 mg at 04/16/23 0935   mirtazapine (REMERON) tablet 30 mg  30 mg Oral QHS Rex Kras, MD   30 mg at 04/15/23 2143   multivitamin with minerals tablet 1 tablet  1 tablet Oral Daily Rex Kras, MD   1 tablet at 04/16/23 2440   nicotine (NICODERM CQ - dosed in mg/24 hours) patch 21 mg  21 mg Transdermal Daily Rex Kras, MD   21 mg at 04/16/23 0811   pantoprazole (PROTONIX) EC tablet 40 mg  40 mg Oral Daily Rex Kras, MD   40 mg at 04/16/23 1027   prazosin (MINIPRESS) capsule 1 mg  1 mg Oral QHS Rex Kras, MD   1 mg at 04/15/23 2142   traZODone (DESYREL) tablet 50 mg  50 mg Oral QHS PRN Rex Kras, MD       PTA Medications: Medications Prior to Admission  Medication Sig Dispense Refill Last Dose    ARIPiprazole ER (ABILIFY MAINTENA) 400 MG PRSY prefilled syringe Inject 400 mg into the muscle every 30 (thirty) days.  busPIRone (BUSPAR) 10 MG tablet Take 1 tablet (10 mg total) by mouth 3 (three) times daily. 90 tablet 0    guanFACINE (INTUNIV) 2 MG TB24 ER tablet Take 2 mg by mouth at bedtime.      hydrOXYzine (ATARAX) 25 MG tablet Take 1 tablet (25 mg total) by mouth 3 (three) times daily as needed for anxiety. 30 tablet 0    mirtazapine (REMERON) 30 MG tablet Take 1 tablet (30 mg total) by mouth at bedtime. 30 tablet 0    Multiple Vitamin (MULTIVITAMIN WITH MINERALS) TABS tablet Take 1 tablet by mouth daily.      nicotine (NICODERM CQ - DOSED IN MG/24 HOURS) 21 mg/24hr patch Place 1 patch (21 mg total) onto the skin daily. 28 patch 0    oxyCODONE (OXY IR/ROXICODONE) 5 MG immediate release tablet Take 5 mg by mouth every 4 (four) hours as needed.      pantoprazole (PROTONIX) 40 MG tablet Take 1 tablet (40 mg total) by mouth daily. 30 tablet 0    prazosin (MINIPRESS) 1 MG capsule Take 1 capsule (1 mg total) by mouth at bedtime. 30 capsule 0    traZODone (DESYREL) 50 MG tablet Take 1 tablet (50 mg total) by mouth at bedtime as needed for sleep. 30 tablet 0     Musculoskeletal: Strength & Muscle Tone: within normal limits Gait & Station: normal Patient leans: N/A            Psychiatric Specialty Exam:  Presentation  General Appearance:  Casual; Disheveled  Eye Contact: Fair  Speech: Slow  Speech Volume: Decreased  Handedness: Right   Mood and Affect  Mood: Anxious; Depressed  Affect: Restricted   Thought Process  Thought Processes: Linear  Duration of Psychotic Symptoms:N/A Past Diagnosis of Schizophrenia or Psychoactive disorder: No  Descriptions of Associations:Intact  Orientation:Full (Time, Place and Person)  Thought Content:Rumination; Perseveration  Hallucinations:Hallucinations: None  Ideas of Reference:None  Suicidal  Thoughts:Suicidal Thoughts: Yes, Passive SI Active Intent and/or Plan: With Intent; With Plan  Homicidal Thoughts:Homicidal Thoughts: No   Sensorium  Memory: Immediate Fair; Remote Fair; Recent Fair  Judgment: Poor  Insight: Fair   Chartered certified accountant: Fair  Attention Span: Fair  Recall: [employer] of Knowledge: Fair  Language: Fair   Psychomotor Activity  Psychomotor Activity: Psychomotor Activity: Normal   Assets  Assets: Manufacturing systems engineer; Desire for Improvement   Sleep  Sleep: Sleep: Good Number of Hours of Sleep: 7.75    Physical Exam: Physical Exam Constitutional:      Appearance: Normal appearance.  Neurological:     General: No focal deficit present.     Mental Status: He is alert and oriented to person, place, and time. Mental status is at baseline.    Review of Systems  Psychiatric/Behavioral:  Positive for depression and suicidal ideas.   All other systems reviewed and are negative.  Blood pressure (!) 120/101, pulse 85, temperature 97.6 F (36.4 C), temperature source Oral, resp. rate 18, height 6\' 2"  (1.88 m), weight 123.6 kg, SpO2 99%. Body mass index is 34.97 kg/m.  Treatment Plan Summary: Daily contact with patient to assess and evaluate symptoms and progress in treatment and Medication management   ASSESSMENT:   Diagnoses / Active Problems: Bipolar disorder, current episode is depressed GAD Stimulant use disorder Alcohol use disorder Sedative-hypnotic use disorder Opioid use disorder Tobacco use disorder   PLAN: Safety and Monitoring:             --  Voluntary admission to inpatient psychiatric unit for safety, stabilization and treatment             -- Daily contact with patient to assess and evaluate symptoms and progress in treatment             -- Patient's case to be discussed in multi-disciplinary team meeting             -- Observation Level : q15 minute checks             -- Vital signs:   q12 hours             -- Precautions: suicide, elopement, and assault   2. Psychiatric Diagnoses and Treatment:               -Resume medications that were prescribed on discharge on 04/15/2023. -These include the following: -Abilify Maintena 400 mg IM every 30 days.  Verify last dose given. -BuSpar 10 mg p.o. 3 times daily -Guanfacine 2 mg TD 24 ER tablet at bedtime -Minipress 1 mg at bedtime -Trazodone 50 mg at bedtime as needed -Remeron 30 mg at bedtime   --  The risks/benefits/side-effects/alternatives to this medication were discussed in detail with the patient and time was given for questions. The patient consents to medication trial.                -- Metabolic profile and EKG monitoring obtained while on an atypical antipsychotic (BMI: Lipid Panel: HbgA1c: QTc:)              -- Encouraged patient to participate in unit milieu and in scheduled group therapies              -- Short Term Goals: Ability to identify changes in lifestyle to reduce recurrence of condition will improve, Ability to verbalize feelings will improve, Ability to disclose and discuss suicidal ideas, Ability to demonstrate self-control will improve, Ability to identify and develop effective coping behaviors will improve, Ability to maintain clinical measurements within normal limits will improve, Compliance with prescribed medications will improve, and Ability to identify triggers associated with substance abuse/mental health issues will improve             -- Long Term Goals: Improvement in symptoms so as ready for discharge                3. Medical Issues Being Addressed:              Tobacco Use Disorder             -- Nicotine patch 21mg /24 hours ordered plus nicotine gum PRN              -- Smoking cessation encouraged   4. Discharge Planning:              -- Social work and case management to assist with discharge planning and identification of hospital follow-up needs prior to discharge             --  Estimated LOS: 5-7 days with appropriate disposition and safety plan.             -- Discharge Concerns: Need to establish a safety plan; Medication compliance and effectiveness             -- Discharge Goals: Return home with outpatient referrals for mental health follow-up including medication management/psychotherapy   I certify that inpatient services furnished can reasonably be expected to improve the patient's condition.  Rex Kras, MD 10/25/202411:23 AM Patient ID: Cody Young., male   DOB: 1990/10/15, 32 y.o.   MRN: [id number] Patient ID: Cody Young., male   DOB: June 18, 1991, 32 y.o.   MRN: [id number]

## 2023-04-16 NOTE — Progress Notes (Signed)
Patient reports SI, auditory and visual hallucinations. Patient stated he is hearing voices telling him to kill himself, and also seeing light moving in his room. Patient denies HI, rated his depression 8/10, and anxiety 0/10. Patient was verbally contract for safety. Support provided as needed. Patient tolerates his scheduled medications without any side effects. Routine safety checks maintained, patient kept safe in and out of the unit.

## 2023-04-16 NOTE — Progress Notes (Signed)
   04/16/23 0644  15 Minute Checks  Location Bedroom  Visual Appearance Calm  Behavior Composed  Sleep (Behavioral Health Patients Only)  Calculate sleep? (Click Yes once per 24 hr at 0600 safety check) Yes  Documented sleep last 24 hours 7.75

## 2023-04-16 NOTE — Progress Notes (Signed)
Che did not attend wrap up group

## 2023-04-16 NOTE — Progress Notes (Signed)
   04/16/23 2200  Psych Admission Type (Psych Patients Only)  Admission Status Voluntary  Psychosocial Assessment  Patient Complaints Anxiety;Depression  Eye Contact Fair  Facial Expression Anxious;Animated  Affect Appropriate to circumstance  Speech Logical/coherent  Interaction Assertive  Motor Activity Slow  Appearance/Hygiene Body odor  Behavior Characteristics Cooperative  Mood Depressed;Anxious  Thought Process  Coherency WDL  Content WDL  Delusions None reported or observed  Perception Hallucinations  Hallucination Auditory;Visual  Judgment Poor  Confusion None  Danger to Self  Current suicidal ideation? Passive  Agreement Not to Harm Self Yes  Description of Agreement Verbal  Danger to Others  Danger to Others None reported or observed

## 2023-04-16 NOTE — Plan of Care (Signed)
  Problem: Education: Goal: Knowledge of [facility] Education information/materials will improve Outcome: Progressing   Problem: Education: Goal: Mental status will improve Outcome: Progressing   Problem: Education: Goal: Verbalization of understanding the information provided will improve Outcome: Progressing   Problem: Physical Regulation: Goal: Ability to maintain clinical measurements within normal limits will improve Outcome: Progressing   Problem: Safety: Goal: Periods of time without injury will increase Outcome: Progressing

## 2023-04-16 NOTE — Progress Notes (Signed)
Pt presents with anxious affect. Pt endorses anxiety and depression and passive suicidal thoughts. Pt does contract for safety at this time. Pt remains medication compliant.

## 2023-04-16 NOTE — Plan of Care (Signed)

## 2023-04-16 NOTE — Group Note (Signed)
Date:  04/16/2023 Time:  11:23 AM  Group Topic/Focus:  Orientation:   The focus of this group is to educate the patient on the purpose and policies of crisis stabilization and provide a format to answer questions about their admission.  The group details unit policies and expectations of patients while admitted.    Participation Level:  Active  Participation Quality:  Appropriate  Affect:  Appropriate  Cognitive:  Alert  Insight: Good  Engagement in Group:  Engaged  Modes of Intervention:  Orientation  Additional Comments:    Beckie Busing 04/16/2023, 11:23 AM

## 2023-04-16 NOTE — Group Note (Signed)
Date:  04/16/2023 Time:  1:23 PM  Group Topic/Focus:  Goals Group:   The focus of this group is to help patients establish daily goals to achieve during treatment and discuss how the patient can incorporate goal setting into their daily lives to aide in recovery.    Participation Level:  Active  Participation Quality:  Appropriate  Affect:  Appropriate  Cognitive:  Alert  Insight: Good  Engagement in Group:  Engaged  Modes of Intervention:  Discussion  Additional Comments:    Beckie Busing 04/16/2023, 1:23 PM

## 2023-04-17 DIAGNOSIS — F1010 Alcohol abuse, uncomplicated: Secondary | ICD-10-CM | POA: Diagnosis not present

## 2023-04-17 MED ORDER — ARIPIPRAZOLE 2 MG PO TABS
2.0000 mg | ORAL_TABLET | Freq: Every day | ORAL | Status: DC
  Administered 2023-04-17 – 2023-04-18 (×2): 2 mg via ORAL
  Filled 2023-04-17 (×3): qty 1

## 2023-04-17 MED ORDER — ONDANSETRON 4 MG PO TBDP
4.0000 mg | ORAL_TABLET | Freq: Three times a day (TID) | ORAL | Status: DC | PRN
  Administered 2023-04-17 – 2023-04-22 (×9): 4 mg via ORAL
  Filled 2023-04-17 (×9): qty 1

## 2023-04-17 NOTE — Plan of Care (Signed)
  Problem: Education: Goal: Knowledge of [facility] Education information/materials will improve Outcome: Progressing   Problem: Activity: Goal: Interest or engagement in activities will improve Outcome: Progressing   Problem: Health Behavior/Discharge Planning: Goal: Compliance with treatment plan for underlying cause of condition will improve Outcome: Progressing   Problem: Safety: Goal: Periods of time without injury will increase Outcome: Progressing   

## 2023-04-17 NOTE — Group Note (Signed)
Date:  04/17/2023 Time:  4:27 PM  Group Topic/Focus:The focus of this group is to engage in a team building activity to discover joy and working together as a team to achieve a goal.       Participation Level:  Did not attend  Participation Quality:    Affect:    Cognitive:    Insight:   Engagement in Group:    Modes of Intervention:    Additional Comments:  Patient was encourage but did not attend group.  Gardiner Barefoot 04/17/2023, 4:27 PM

## 2023-04-17 NOTE — Progress Notes (Signed)
Pt endorsses anxiety and depression today. Passive SI remains, pt does contract for safety. Pt received LAI today. Pt endorses some nausea and received zofran with good effect. Pt endorses AVH continues. Q 15 minute checks ongoing.

## 2023-04-17 NOTE — Progress Notes (Signed)
Psychiatric progress note  Patient Identification: Cody Young. MRN:  [id number] Date of Evaluation:  04/17/2023 Chief Complaint:  Alcohol abuse [F10.10] Principal Diagnosis: Alcohol abuse Diagnosis:  Principal Problem:   Alcohol abuse   Reason for admission   The patient is a 32 year old Caucasian male with a history of bipolar disorder and substance use disorder who was admitted with increasing depression and suicidal ideations with a plan to either slit his wrist or hanging himself.  He was recently discharged from this facility on 04/13/2023 with a safety plan which apparently fell through.  Chart review from last 24 hours   Staff reports that the patient is not doing well.  He continues to remain isolated and depressed and continues to endorse suicidal ideations but is able to contract for safety.  Staff reports that he slept 9.25 hours.  He is compliant with medications.  He received as needed Maalox and Robaxin.  He continues to take the rest of his medications as prescribed.  He is not due for his Abilify Maintena injection.  Yesterday, the psychiatry team made the following recommendations:  Resume home medications. Abilify ER 400 mg IM every month BuSpar 10 mg p.o. 3 times daily Guanfacine 2 mg p.o. at bedtime Remeron 30 mg at bedtime Minipress 1 mg at bedtime. NicoDerm patch 21 mg transdermal daily Patient is also on Robaxin as needed Information obtained during interview   The patient was seen and evaluated.  He is alert oriented and cooperative but quite disheveled and withdrawn.  He maintained fair eye contact.  He continues to endorse ongoing suicidal ideations with a plan to hang himself but is able to contract for safety.  He reports that he will talk to someone if he feels actively suicidal.  He claims he slept poorly and that his mind was racing.  He is already on Abilify Maintena 400 mg IM every month and is not due for it.  Given the racing thoughts and the  low-dose Abilify 2 mg a day will be started until he gets his Abilify Maintena injection.  He is encouraged to contact the [facility] [place] and alert rehab facilities.  Associated Signs/Symptoms: Depression Symptoms:  psychomotor retardation, suicidal thoughts with specific plan, (Hypo) Manic Symptoms:  Impulsivity, Anxiety Symptoms:  Excessive Worry, Psychotic Symptoms:  Paranoia, PTSD Symptoms: Negative Total Time spent with patient: 30 minutes  Past Psychiatric History: The patient was admitted to [facility] from 10/14 to 04/15/2023.  He returned back for the current admission within 24 hours.  He has multiple prior admissions with a diagnosis of bipolar disorder.  He reports using multiple medications including Abilify, Remeron, Cymbalta, prazosin, BuSpar.  He gives a history of at least 2 suicide attempts in the past.  Past medical history significant for history of OSA but he has not been tested for this.  He denies any history of seizures.  He denies any drug allergies.   Is the patient at risk to self? Yes.    Has the patient been a risk to self in the past 6 months? Yes.    Has the patient been a risk to self within the distant past? Yes.    Is the patient a risk to others? No.  Has the patient been a risk to others in the past 6 months? No.  Has the patient been a risk to others within the distant past? No.   [place] Scale:  Flowsheet Row [facility]) [facility] from [facility]13/2023 in [facility] Admission (Discharged) [facility]  C-SSRS RISK CATEGORY Low Risk High Risk No Risk        Prior Inpatient Therapy: Yes.   If yes, describe patient had multiple admissions in the past.  Recent discharge on 04/15/2023. Prior Outpatient Therapy: Yes.   If yes, describe noncompliant with outpatient  treatment.  Alcohol Screening: Patient refused Alcohol Screening Tool: Yes (''nothing since i left yesterday) 1. How often do you have a drink containing alcohol?: Never 2. How many drinks containing alcohol do you have on a typical day when you are drinking?: 1 or 2 3. How often do you have six or more drinks on one occasion?: Never AUDIT-C Score: 0 4. How often during the last year have you found that you were not able to stop drinking once you had started?: Never 5. How often during the last year have you failed to do what was normally expected from you because of drinking?: Never 6. How often during the last year have you needed a first drink in the morning to get yourself going after a heavy drinking session?: Never 7. How often during the last year have you had a feeling of guilt of remorse after drinking?: Never 8. How often during the last year have you been unable to remember what happened the night before because you had been drinking?: Never 9. Have you or someone else been injured as a result of your drinking?: No 10. Has a relative or friend or a doctor or another health worker been concerned about your drinking or suggested you cut down?: No Alcohol Use Disorder Identification Test Final Score (AUDIT): 0 Alcohol Brief Interventions/Follow-up: Alcohol education/Brief advice Substance Abuse History in the last 12 months:  Yes.   Consequences of Substance Abuse: Withdrawal Symptoms:   Cramps Diaphoresis Nausea Tremors Previous Psychotropic Medications: Yes  Psychological Evaluations: Yes  Past Medical History:  Past Medical History:  Diagnosis Date   Bipolar 1 disorder (HCC)    Hypertension    Medical history non-contributory    Mood and affect disturbance     Past Surgical History:  Procedure Laterality Date   BRAIN SURGERY     Family History: History reviewed. No pertinent family history. Family Psychiatric  History: According to the records mother has a history of  bipolar disorder and maternal grandmother has schizophrenia. Tobacco Screening:  Social History   Tobacco Use  Smoking Status Every Day   Current packs/day: 1.00   Average packs/day: 1 pack/day for 20.0 years (20.0 ttl pk-yrs)   Types: Cigarettes  Smokeless Tobacco Never    BH Tobacco Counseling     Are you interested in Tobacco Cessation Medications?  Yes, implement Nicotene Replacement Protocol Counseled patient on smoking cessation:  Refused/Declined practical counseling Reason Tobacco Screening Not Completed: Patient Refused Screening       Social History:  Social History   Substance and Sexual Activity  Alcohol Use Yes   Alcohol/week: 12.0 standard drinks of alcohol   Types: 12 Cans of beer per week   Comment: Currently drinks mad dogs     Social History   Substance and Sexual Activity  Drug Use Yes   Types: Marijuana, Cocaine, "Crack" cocaine   Comment: deneis current cocaine use, none since last admit    Additional Social History: Marital status: Single Are you sexually active?: No What is your sexual orientation?: Heterosexual  Has your sexual activity been affected by drugs, alcohol, medication, or emotional stress?: None reported Does patient have children?: No                         Allergies:  No Known Allergies Lab Results:  No results found for this or any previous visit (from the past 48 hour(s)).   Blood Alcohol level:  Lab Results  Component Value Date   ETH <10 04/13/2023   ETH <10 04/04/2023    Metabolic Disorder Labs:  Lab Results  Component Value Date   HGBA1C 5.5 04/06/2023   MPG 111.15 04/06/2023   MPG 103 09/02/2022   No results found for: "PROLACTIN" Lab Results  Component Value Date   CHOL 166 04/06/2023   TRIG 96 04/06/2023   HDL 34 (L) 04/06/2023   CHOLHDL 4.9 04/06/2023   VLDL 19 04/06/2023   LDLCALC 113 (H) 04/06/2023   LDLCALC 132 (H) 09/02/2022    Current Medications: Current Facility-Administered  Medications  Medication Dose Route Frequency Provider Last Rate Last Admin   acetaminophen (TYLENOL) tablet 650 mg  650 mg Oral Q6H PRN Sindy Guadeloupe, NP   650 mg at 04/14/23 1555   alum & mag hydroxide-simeth (MAALOX/MYLANTA) 200-200-20 MG/5ML suspension 30 mL  30 mL Oral Q4H PRN Sindy Guadeloupe, NP   30 mL at 04/17/23 1610   ARIPiprazole ER (ABILIFY MAINTENA) 400 MG prefilled syringe 400 mg  400 mg Intramuscular Q30 days Rex Kras, MD       busPIRone (BUSPAR) tablet 10 mg  10 mg Oral TID Rex Kras, MD   10 mg at 04/17/23 0831   guanFACINE (INTUNIV) ER tablet 2 mg  2 mg Oral QHS Rex Kras, MD   2 mg at 04/16/23 2057   hydrOXYzine (ATARAX) tablet 25 mg  25 mg Oral TID PRN Rex Kras, MD   25 mg at 04/16/23 2014   OLANZapine zydis (ZYPREXA) disintegrating tablet 5 mg  5 mg Oral Q8H PRN Sindy Guadeloupe, NP       And   LORazepam (ATIVAN) tablet 1 mg  1 mg Oral PRN Sindy Guadeloupe, NP       And   ziprasidone (GEODON) injection 20 mg  20 mg Intramuscular PRN Sindy Guadeloupe, NP       magnesium hydroxide (MILK OF MAGNESIA) suspension 30 mL  30 mL Oral Daily PRN Sindy Guadeloupe, NP       methocarbamol (ROBAXIN) tablet 500 mg  500 mg Oral Q6H PRN Massengill, Harrold Donath, MD   500 mg at 04/16/23 1814   mirtazapine (REMERON) tablet 30 mg  30 mg Oral QHS Rex Kras, MD   30 mg at 04/16/23 2057   multivitamin with minerals tablet 1 tablet  1 tablet Oral Daily Rex Kras, MD   1 tablet at 04/17/23 0831   nicotine (NICODERM CQ - dosed in mg/24 hours) patch 21 mg  21 mg Transdermal Daily Rex Kras, MD   21 mg at 04/17/23 0831   pantoprazole (PROTONIX) EC tablet 40 mg  40 mg Oral Daily Rex Kras, MD   40 mg at 04/17/23 0831   prazosin (MINIPRESS) capsule 1 mg  1 mg Oral QHS Rex Kras, MD   1 mg at 04/16/23 2056   traZODone (DESYREL) tablet 50 mg  50 mg Oral QHS PRN Rex Kras, MD       PTA Medications: Medications Prior to Admission  Medication Sig Dispense  Refill Last Dose   ARIPiprazole ER (  ABILIFY MAINTENA) 400 MG PRSY prefilled syringe Inject 400 mg into the muscle every 30 (thirty) days.      busPIRone (BUSPAR) 10 MG tablet Take 1 tablet (10 mg total) by mouth 3 (three) times daily. 90 tablet 0    guanFACINE (INTUNIV) 2 MG TB24 ER tablet Take 2 mg by mouth at bedtime.      hydrOXYzine (ATARAX) 25 MG tablet Take 1 tablet (25 mg total) by mouth 3 (three) times daily as needed for anxiety. 30 tablet 0    mirtazapine (REMERON) 30 MG tablet Take 1 tablet (30 mg total) by mouth at bedtime. 30 tablet 0    Multiple Vitamin (MULTIVITAMIN WITH MINERALS) TABS tablet Take 1 tablet by mouth daily.      nicotine (NICODERM CQ - DOSED IN MG/24 HOURS) 21 mg/24hr patch Place 1 patch (21 mg total) onto the skin daily. 28 patch 0    oxyCODONE (OXY IR/ROXICODONE) 5 MG immediate release tablet Take 5 mg by mouth every 4 (four) hours as needed.      pantoprazole (PROTONIX) 40 MG tablet Take 1 tablet (40 mg total) by mouth daily. 30 tablet 0    prazosin (MINIPRESS) 1 MG capsule Take 1 capsule (1 mg total) by mouth at bedtime. 30 capsule 0    traZODone (DESYREL) 50 MG tablet Take 1 tablet (50 mg total) by mouth at bedtime as needed for sleep. 30 tablet 0     Musculoskeletal: Strength & Muscle Tone: within normal limits Gait & Station: normal Patient leans: N/A            Psychiatric Specialty Exam:  Presentation  General Appearance:  Casual  Eye Contact: Fair  Speech: Slow  Speech Volume: Decreased  Handedness: Right   Mood and Affect  Mood: Depressed  Affect: Restricted   Thought Process  Thought Processes: Coherent  Duration of Psychotic Symptoms:N/A Past Diagnosis of Schizophrenia or Psychoactive disorder: No  Descriptions of Associations:Intact  Orientation:Full (Time, Place and Person)  Thought Content:Rumination; Perseveration  Hallucinations:No data recorded  Ideas of Reference:None  Suicidal Thoughts:Suicidal  Thoughts: Yes, Passive SI Active Intent and/or Plan: With Intent; With Plan  Homicidal Thoughts:Homicidal Thoughts: No   Sensorium  Memory: Immediate Fair; Recent Fair; Remote Fair  Judgment: Fair  Insight: Fair   Chartered certified accountant: Fair  Attention Span: Fair  Recall: [employer] of Knowledge: Fair  Language: Fair   Psychomotor Activity  Psychomotor Activity: Psychomotor Activity: Normal   Assets  Assets: Manufacturing systems engineer; Desire for Improvement   Sleep  Sleep: Sleep: Good Number of Hours of Sleep: 9.25    Physical Exam: Physical Exam Constitutional:      Appearance: Normal appearance.  Neurological:     General: No focal deficit present.     Mental Status: He is alert and oriented to person, place, and time. Mental status is at baseline.    Review of Systems  Psychiatric/Behavioral:  Positive for depression and suicidal ideas.   All other systems reviewed and are negative.  Blood pressure 115/82, pulse 79, temperature 97.8 F (36.6 C), temperature source Oral, resp. rate 16, height 6\' 2"  (1.88 m), weight 123.6 kg, SpO2 99%. Body mass index is 34.97 kg/m.  Treatment Plan Summary: Daily contact with patient to assess and evaluate symptoms and progress in treatment and Medication management   ASSESSMENT:   Diagnoses / Active Problems: Bipolar disorder, current episode is depressed GAD Stimulant use disorder Alcohol use disorder Sedative-hypnotic use disorder Opioid use disorder Tobacco use  disorder   PLAN: Safety and Monitoring:             --  Voluntary admission to inpatient psychiatric unit for safety, stabilization and treatment             -- Daily contact with patient to assess and evaluate symptoms and progress in treatment             -- Patient's case to be discussed in multi-disciplinary team meeting             -- Observation Level : q15 minute checks             -- Vital signs:  q12 hours              -- Precautions: suicide, elopement, and assault   2. Psychiatric Diagnoses and Treatment:               -Resume medications that were prescribed on discharge on 04/15/2023. -These include the following: -Abilify Maintena 400 mg IM every 30 days.  Verify last dose given. -BuSpar 10 mg p.o. 3 times daily -Guanfacine 2 mg TD 24 ER tablet at bedtime -Minipress 1 mg at bedtime -Trazodone 50 mg at bedtime as needed -Remeron 30 mg at bedtime -Add Abilify 2 mg a day for 14 days   --  The risks/benefits/side-effects/alternatives to this medication were discussed in detail with the patient and time was given for questions. The patient consents to medication trial.                -- Metabolic profile and EKG monitoring obtained while on an atypical antipsychotic (BMI: Lipid Panel: HbgA1c: QTc:)              -- Encouraged patient to participate in unit milieu and in scheduled group therapies              -- Short Term Goals: Ability to identify changes in lifestyle to reduce recurrence of condition will improve, Ability to verbalize feelings will improve, Ability to disclose and discuss suicidal ideas, Ability to demonstrate self-control will improve, Ability to identify and develop effective coping behaviors will improve, Ability to maintain clinical measurements within normal limits will improve, Compliance with prescribed medications will improve, and Ability to identify triggers associated with substance abuse/mental health issues will improve             -- Long Term Goals: Improvement in symptoms so as ready for discharge                3. Medical Issues Being Addressed:              Tobacco Use Disorder             -- Nicotine patch 21mg /24 hours ordered plus nicotine gum PRN              -- Smoking cessation encouraged   4. Discharge Planning:              -- Social work and case management to assist with discharge planning and identification of hospital follow-up needs prior to discharge              -- Estimated LOS: 5-7 days with appropriate disposition and safety plan.             -- Discharge Concerns: Need to establish a safety plan; Medication compliance and effectiveness             -- Discharge Goals:  Return home with outpatient referrals for mental health follow-up including medication management/psychotherapy   I certify that inpatient services furnished can reasonably be expected to improve the patient's condition.    Rex Kras, MD 10/26/20249:44 AM Patient ID: Colin Broach., male   DOB: Feb 13, 1991, 32 y.o.   MRN: [id number] Patient ID: Raydyn Hewey., male   DOB: [birth year], 32 y.o.   MRN: [id number] Patient ID: Berlin Filipiak., male   DOB: 06/29/1990, 32 y.o.   MRN: [id number]

## 2023-04-17 NOTE — Group Note (Signed)
Date:  04/17/2023 Time:  1:12 PM  Group Topic/Focus:  Goals Group:   The focus of this group is to help patients establish daily goals to achieve during treatment and discuss how the patient can incorporate goal setting into their daily lives to aide in recovery.    Participation Level:  Active  Participation Quality:  Appropriate  Affect:  Appropriate  Cognitive:  Appropriate  Insight: Appropriate  Engagement in Group:  Engaged  Modes of Intervention:  Discussion  Additional Comments:    Pearle Wandler D Jaime Grizzell 04/17/2023, 1:12 PM

## 2023-04-17 NOTE — Plan of Care (Signed)
  Problem: Education: Goal: Knowledge of [facility] Education information/materials will improve 04/17/2023 0701 by Olin Hauser I, RN Outcome: Progressing 04/17/2023 0700 by Olin Hauser I, RN Outcome: Progressing   Problem: Education: Goal: Mental status will improve Outcome: Progressing   Problem: Activity: Goal: Interest or engagement in activities will improve 04/17/2023 0701 by Olin Hauser I, RN Outcome: Progressing 04/17/2023 0700 by Olin Hauser I, RN Outcome: Progressing   Problem: Health Behavior/Discharge Planning: Goal: Compliance with treatment plan for underlying cause of condition will improve 04/17/2023 0701 by Olin Hauser I, RN Outcome: Progressing 04/17/2023 0700 by Olin Hauser I, RN Outcome: Progressing   Problem: Safety: Goal: Periods of time without injury will increase 04/17/2023 0701 by Olin Hauser I, RN Outcome: Progressing 04/17/2023 0700 by Olin Hauser I, RN Outcome: Progressing   Problem: Safety: Goal: Periods of time without injury will increase 04/17/2023 0701 by Olin Hauser I, RN Outcome: Progressing 04/17/2023 0700 by Olin Hauser I, RN Outcome: Progressing

## 2023-04-17 NOTE — Progress Notes (Signed)
   04/17/23 0500  15 Minute Checks  Location Bedroom  Visual Appearance Calm  Behavior Sleeping  Sleep (Behavioral Health Patients Only)  Calculate sleep? (Click Yes once per 24 hr at 0600 safety check) Yes  Documented sleep last 24 hours 9.25

## 2023-04-18 DIAGNOSIS — F1010 Alcohol abuse, uncomplicated: Secondary | ICD-10-CM | POA: Diagnosis not present

## 2023-04-18 MED ORDER — ARIPIPRAZOLE 5 MG PO TABS
5.0000 mg | ORAL_TABLET | Freq: Every day | ORAL | Status: DC
  Administered 2023-04-19 – 2023-04-22 (×4): 5 mg via ORAL
  Filled 2023-04-18 (×5): qty 1

## 2023-04-18 NOTE — Plan of Care (Signed)

## 2023-04-18 NOTE — Progress Notes (Signed)
Psychiatric progress note  Patient Identification: Cody Young. MRN:  [id number] Date of Evaluation:  04/18/2023 Chief Complaint:  Alcohol abuse [F10.10] Principal Diagnosis: Alcohol abuse Diagnosis:  Principal Problem:   Alcohol abuse   Reason for admission   The patient is a 32 year old Caucasian male with a history of bipolar disorder and substance use disorder who was admitted with increasing depression and suicidal ideations with a plan to either slit his wrist or hanging himself.  He was recently discharged from this facility on 04/13/2023 with a safety plan which apparently fell through.  Chart review from last 24 hours   Staff reports that the patient remains depressed and withdrawn.  He is laying in bed and sleeping 9.5 hours.  He is compliant with medications.  He reports hallucinations and suicidal ideations but is able to contract for safety.  Yesterday, the psychiatry team made the following recommendations:  Resume home medications. Begin Abilify 2 mg a day for increased hallucinations Abilify ER 400 mg IM every month BuSpar 10 mg p.o. 3 times daily Guanfacine 2 mg p.o. at bedtime Remeron 30 mg at bedtime Minipress 1 mg at bedtime. NicoDerm patch 21 mg transdermal daily Patient is also on Robaxin as needed Information obtained during interview   The patient was seen and evaluated.  He is lying in bed and appears quite withdrawn and depressed.  He maintained good eye contact and reports that he feels suicidal and hears voices telling him to hang himself.  However he states that he is going to contract for safety.  He denies any active SI/HI.  He still wants help for his substance use and placement.  He has contacted [facility] in [place] and hopes to hear from them tomorrow.  He is not due for his Abilify Maintena injection yet.  In the meantime we will continue to supplement with a increase in the Abilify to 5 mg a day.  He is encouraged to contact the  [facility] [place] and alert rehab facilities.  Associated Signs/Symptoms: Depression Symptoms:  psychomotor retardation, suicidal thoughts with specific plan, (Hypo) Manic Symptoms:  Impulsivity, Anxiety Symptoms:  Excessive Worry, Psychotic Symptoms:  Paranoia, PTSD Symptoms: Negative Total Time spent with patient: 30 minutes  Past Psychiatric History: The patient was admitted to [facility] behavioral health from 10/14 to 04/15/2023.  He returned back for the current admission within 24 hours.  He has multiple prior admissions with a diagnosis of bipolar disorder.  He reports using multiple medications including Abilify, Remeron, Cymbalta, prazosin, BuSpar.  He gives a history of at least 2 suicide attempts in the past.  Past medical history significant for history of OSA but he has not been tested for this.  He denies any history of seizures.  He denies any drug allergies.   Is the patient at risk to self? Yes.    Has the patient been a risk to self in the past 6 months? Yes.    Has the patient been a risk to self within the distant past? Yes.    Is the patient a risk to others? No.  Has the patient been a risk to others in the past 6 months? No.  Has the patient been a risk to others within the distant past? No.   [place] Scale:  Flowsheet Row [facility]) [facility] from 04/13/2023 in [facility] Admission (Discharged) from [facility]  C-SSRS RISK CATEGORY Low Risk High Risk No Risk        Prior Inpatient Therapy: Yes.   If yes, describe patient had multiple admissions in the past.  Recent discharge on 04/15/2023. Prior Outpatient Therapy: Yes.   If yes, describe noncompliant with outpatient treatment.  Alcohol Screening: Patient refused Alcohol Screening Tool: Yes (''nothing since i left yesterday) 1. How often do you have  a drink containing alcohol?: Never 2. How many drinks containing alcohol do you have on a typical day when you are drinking?: 1 or 2 3. How often do you have six or more drinks on one occasion?: Never AUDIT-C Score: 0 4. How often during the last year have you found that you were not able to stop drinking once you had started?: Never 5. How often during the last year have you failed to do what was normally expected from you because of drinking?: Never 6. How often during the last year have you needed a first drink in the morning to get yourself going after a heavy drinking session?: Never 7. How often during the last year have you had a feeling of guilt of remorse after drinking?: Never 8. How often during the last year have you been unable to remember what happened the night before because you had been drinking?: Never 9. Have you or someone else been injured as a result of your drinking?: No 10. Has a relative or friend or a doctor or another health worker been concerned about your drinking or suggested you cut down?: No Alcohol Use Disorder Identification Test Final Score (AUDIT): 0 Alcohol Brief Interventions/Follow-up: Alcohol education/Brief advice Substance Abuse History in the last 12 months:  Yes.   Consequences of Substance Abuse: Withdrawal Symptoms:   Cramps Diaphoresis Nausea Tremors Previous Psychotropic Medications: Yes  Psychological Evaluations: Yes  Past Medical History:  Past Medical History:  Diagnosis Date   Bipolar 1 disorder (HCC)    Hypertension    Medical history non-contributory    Mood and affect disturbance     Past Surgical History:  Procedure Laterality Date   BRAIN SURGERY     Family History: History reviewed. No pertinent family history. Family Psychiatric  History: According to the records mother has a history of bipolar disorder and maternal grandmother has schizophrenia. Tobacco Screening:  Social History   Tobacco Use  Smoking Status Every  Day   Current packs/day: 1.00   Average packs/day: 1 pack/day for 20.0 years (20.0 ttl pk-yrs)   Types: Cigarettes  Smokeless Tobacco Never    BH Tobacco Counseling     Are you interested in Tobacco Cessation Medications?  Yes, implement Nicotene Replacement Protocol Counseled patient on smoking cessation:  Refused/Declined practical counseling Reason Tobacco Screening Not Completed: Patient Refused Screening       Social History:  Social History   Substance and Sexual Activity  Alcohol Use Yes   Alcohol/week: 12.0 standard drinks of alcohol   Types: 12 Cans of beer per week   Comment: Currently drinks mad dogs     Social History   Substance and Sexual Activity  Drug Use Yes   Types: Marijuana, Cocaine, "Crack" cocaine   Comment: deneis current cocaine use, none since last admit    Additional Social History: Marital status: Single Are you sexually active?: No What is your sexual orientation?: Heterosexual Has your sexual activity been affected by drugs, alcohol, medication, or emotional stress?: None reported Does patient have children?: No  Allergies:  No Known Allergies Lab Results:  No results found for this or any previous visit (from the past 48 hour(s)).   Blood Alcohol level:  Lab Results  Component Value Date   ETH <10 04/13/2023   ETH <10 04/04/2023    Metabolic Disorder Labs:  Lab Results  Component Value Date   HGBA1C 5.5 04/06/2023   MPG 111.15 04/06/2023   MPG 103 09/02/2022   No results found for: "PROLACTIN" Lab Results  Component Value Date   CHOL 166 04/06/2023   TRIG 96 04/06/2023   HDL 34 (L) 04/06/2023   CHOLHDL 4.9 04/06/2023   VLDL 19 04/06/2023   LDLCALC 113 (H) 04/06/2023   LDLCALC 132 (H) 09/02/2022    Current Medications: Current Facility-Administered Medications  Medication Dose Route Frequency Provider Last Rate Last Admin   acetaminophen (TYLENOL) tablet 650 mg  650 mg Oral Q6H PRN  Sindy Guadeloupe, NP   650 mg at 04/14/23 1555   alum & mag hydroxide-simeth (MAALOX/MYLANTA) 200-200-20 MG/5ML suspension 30 mL  30 mL Oral Q4H PRN Sindy Guadeloupe, NP   30 mL at 04/17/23 1658   ARIPiprazole (ABILIFY) tablet 2 mg  2 mg Oral Daily Rex Kras, MD   2 mg at 04/18/23 0743   ARIPiprazole ER (ABILIFY MAINTENA) 400 MG prefilled syringe 400 mg  400 mg Intramuscular Q30 days Rex Kras, MD   400 mg at 04/17/23 1011   busPIRone (BUSPAR) tablet 10 mg  10 mg Oral TID Rex Kras, MD   10 mg at 04/18/23 0743   guanFACINE (INTUNIV) ER tablet 2 mg  2 mg Oral QHS Rex Kras, MD   2 mg at 04/17/23 2117   hydrOXYzine (ATARAX) tablet 25 mg  25 mg Oral TID PRN Rex Kras, MD   25 mg at 04/17/23 2118   OLANZapine zydis (ZYPREXA) disintegrating tablet 5 mg  5 mg Oral Q8H PRN Sindy Guadeloupe, NP       And   LORazepam (ATIVAN) tablet 1 mg  1 mg Oral PRN Sindy Guadeloupe, NP       And   ziprasidone (GEODON) injection 20 mg  20 mg Intramuscular PRN Sindy Guadeloupe, NP       magnesium hydroxide (MILK OF MAGNESIA) suspension 30 mL  30 mL Oral Daily PRN Sindy Guadeloupe, NP       methocarbamol (ROBAXIN) tablet 500 mg  500 mg Oral Q6H PRN Massengill, Harrold Donath, MD   500 mg at 04/17/23 1658   mirtazapine (REMERON) tablet 30 mg  30 mg Oral QHS Rex Kras, MD   30 mg at 04/17/23 2117   multivitamin with minerals tablet 1 tablet  1 tablet Oral Daily Rex Kras, MD   1 tablet at 04/18/23 0743   nicotine (NICODERM CQ - dosed in mg/24 hours) patch 21 mg  21 mg Transdermal Daily Rex Kras, MD   21 mg at 04/18/23 0740   ondansetron (ZOFRAN-ODT) disintegrating tablet 4 mg  4 mg Oral Q8H PRN Rex Kras, MD   4 mg at 04/17/23 2118   pantoprazole (PROTONIX) EC tablet 40 mg  40 mg Oral Daily Rex Kras, MD   40 mg at 04/18/23 0743   prazosin (MINIPRESS) capsule 1 mg  1 mg Oral QHS Rex Kras, MD   1 mg at 04/17/23 2117   traZODone (DESYREL) tablet 50 mg  50 mg Oral QHS PRN  Rex Kras, MD       PTA Medications: Medications Prior to Admission  Medication Sig Dispense Refill Last  Dose   ARIPiprazole ER (ABILIFY MAINTENA) 400 MG PRSY prefilled syringe Inject 400 mg into the muscle every 30 (thirty) days.      busPIRone (BUSPAR) 10 MG tablet Take 1 tablet (10 mg total) by mouth 3 (three) times daily. 90 tablet 0    guanFACINE (INTUNIV) 2 MG TB24 ER tablet Take 2 mg by mouth at bedtime.      hydrOXYzine (ATARAX) 25 MG tablet Take 1 tablet (25 mg total) by mouth 3 (three) times daily as needed for anxiety. 30 tablet 0    mirtazapine (REMERON) 30 MG tablet Take 1 tablet (30 mg total) by mouth at bedtime. 30 tablet 0    Multiple Vitamin (MULTIVITAMIN WITH MINERALS) TABS tablet Take 1 tablet by mouth daily.      nicotine (NICODERM CQ - DOSED IN MG/24 HOURS) 21 mg/24hr patch Place 1 patch (21 mg total) onto the skin daily. 28 patch 0    oxyCODONE (OXY IR/ROXICODONE) 5 MG immediate release tablet Take 5 mg by mouth every 4 (four) hours as needed.      pantoprazole (PROTONIX) 40 MG tablet Take 1 tablet (40 mg total) by mouth daily. 30 tablet 0    prazosin (MINIPRESS) 1 MG capsule Take 1 capsule (1 mg total) by mouth at bedtime. 30 capsule 0    traZODone (DESYREL) 50 MG tablet Take 1 tablet (50 mg total) by mouth at bedtime as needed for sleep. 30 tablet 0     Musculoskeletal: Strength & Muscle Tone: within normal limits Gait & Station: normal Patient leans: N/A            Psychiatric Specialty Exam:  Presentation  General Appearance:  Disheveled  Eye Contact: Fair  Speech: Slow  Speech Volume: Decreased  Handedness: Right   Mood and Affect  Mood: Anxious; Depressed  Affect: Restricted   Thought Process  Thought Processes: Linear  Duration of Psychotic Symptoms:N/A Past Diagnosis of Schizophrenia or Psychoactive disorder: No  Descriptions of Associations:Intact  Orientation:Full (Time, Place and Person)  Thought  Content:Perseveration; Rumination  Hallucinations:Hallucinations: Auditory Description of Auditory Hallucinations: Voices telling him to kill himself   Ideas of Reference:None  Suicidal Thoughts:Suicidal Thoughts: Yes, Passive SI Active Intent and/or Plan: With Intent; With Plan SI Passive Intent and/or Plan: With Intent; With Plan  Homicidal Thoughts:Homicidal Thoughts: No   Sensorium  Memory: Immediate Fair; Remote Fair; Recent Fair  Judgment: Poor  Insight: Fair   Chartered certified accountant: Fair  Attention Span: Fair  Recall: [employer] of Knowledge: Fair  Language: Fair   Psychomotor Activity  Psychomotor Activity: Psychomotor Activity: Normal   Assets  Assets: Manufacturing systems engineer; Desire for Improvement   Sleep  Sleep: Sleep: Good Number of Hours of Sleep: 9.25    Physical Exam: Physical Exam Constitutional:      Appearance: Normal appearance.  Neurological:     General: No focal deficit present.     Mental Status: He is alert and oriented to person, place, and time. Mental status is at baseline.    Review of Systems  Psychiatric/Behavioral:  Positive for depression and suicidal ideas.   All other systems reviewed and are negative.  Blood pressure 110/78, pulse 72, temperature (!) 97.5 F (36.4 C), temperature source Oral, resp. rate 16, height 6\' 2"  (1.88 m), weight 123.6 kg, SpO2 98%. Body mass index is 34.97 kg/m.  Treatment Plan Summary: Daily contact with patient to assess and evaluate symptoms and progress in treatment and Medication management   ASSESSMENT:  Diagnoses / Active Problems: Bipolar disorder, current episode is depressed GAD Stimulant use disorder Alcohol use disorder Sedative-hypnotic use disorder Opioid use disorder Tobacco use disorder   PLAN: Safety and Monitoring:             --  Voluntary admission to inpatient psychiatric unit for safety, stabilization and treatment             --  Daily contact with patient to assess and evaluate symptoms and progress in treatment             -- Patient's case to be discussed in multi-disciplinary team meeting             -- Observation Level : q15 minute checks             -- Vital signs:  q12 hours             -- Precautions: suicide, elopement, and assault   2. Psychiatric Diagnoses and Treatment:               -Resume medications that were prescribed on discharge on 04/15/2023. -These include the following: -Abilify Maintena 400 mg IM every 30 days.  Verify last dose given. -BuSpar 10 mg p.o. 3 times daily -Guanfacine 2 mg TD 24 ER tablet at bedtime -Minipress 1 mg at bedtime -Trazodone 50 mg at bedtime as needed -Remeron 30 mg at bedtime -Increase Abilify 5 mg a day for 14 days   --  The risks/benefits/side-effects/alternatives to this medication were discussed in detail with the patient and time was given for questions. The patient consents to medication trial.                -- Metabolic profile and EKG monitoring obtained while on an atypical antipsychotic (BMI: Lipid Panel: HbgA1c: QTc:)              -- Encouraged patient to participate in unit milieu and in scheduled group therapies              -- Short Term Goals: Ability to identify changes in lifestyle to reduce recurrence of condition will improve, Ability to verbalize feelings will improve, Ability to disclose and discuss suicidal ideas, Ability to demonstrate self-control will improve, Ability to identify and develop effective coping behaviors will improve, Ability to maintain clinical measurements within normal limits will improve, Compliance with prescribed medications will improve, and Ability to identify triggers associated with substance abuse/mental health issues will improve             -- Long Term Goals: Improvement in symptoms so as ready for discharge                3. Medical Issues Being Addressed:              Tobacco Use Disorder             --  Nicotine patch 21mg /24 hours ordered plus nicotine gum PRN              -- Smoking cessation encouraged   4. Discharge Planning:              -- Social work and case management to assist with discharge planning and identification of hospital follow-up needs prior to discharge             -- Estimated LOS: 5-7 days with appropriate disposition and safety plan.             -- Discharge Concerns:  Need to establish a safety plan; Medication compliance and effectiveness             -- Discharge Goals: Return home with outpatient referrals for mental health follow-up including medication management/psychotherapy   I certify that inpatient services furnished can reasonably be expected to improve the patient's condition.    Rex Kras, MD 10/27/202410:22 AM Patient ID: Cody Broach., male   DOB: [birth year], 32 y.o.   MRN: [id number] Patient ID: Cody Mole., male   DOB: Sep 04, 1990, 32 y.o.   MRN: [id number] Patient ID: Cody Gochanour., male   DOB: 21-Jan-1991, 32 y.o.   MRN: [id number] Patient ID: Cody Sutherland., male   DOB: 17-Nov-1990, 32 y.o.   MRN: [id number]

## 2023-04-18 NOTE — [facility] Group Notes (Signed)
[facility] Group Notes:  (Nursing/MHT/Case Management/Adjunct)  Date:  04/18/2023  Time:  2:07 PM  Type of Therapy:  Psychoeducational Skills  Participation Level:  Active  Participation Quality:  Appropriate and Attentive  Affect:  Appropriate  Cognitive:  Appropriate  Insight:  Appropriate  Engagement in Group:  Engaged  Modes of Intervention:  Discussion, Education, and Exploration  Summary of Progress/Problems: Patients were given education on positive reframing and how to identify the impact of negative thinking patterns on mental health. Additionally, a podcast was play by Dr. Yetta Glassman on the impact of negative beliefs. Pt attended and was appropriate.

## 2023-04-18 NOTE — Progress Notes (Signed)
   04/18/23 2100  Psych Admission Type (Psych Patients Only)  Admission Status Voluntary  Psychosocial Assessment  Patient Complaints Anxiety  Eye Contact Fair  Facial Expression Anxious  Affect Appropriate to circumstance  Speech Logical/coherent  Interaction Assertive  Motor Activity Slow  Appearance/Hygiene In scrubs  Behavior Characteristics Cooperative  Mood Depressed;Anxious  Aggressive Behavior  Effect No apparent injury  Thought Process  Coherency WDL  Content WDL  Delusions None reported or observed  Perception Hallucinations  Hallucination Auditory;Visual  Judgment Poor  Confusion None  Danger to Self  Current suicidal ideation? Passive  Agreement Not to Harm Self Yes  Description of Agreement verbally contracted for safety  Danger to Others  Danger to Others None reported or observed

## 2023-04-18 NOTE — [facility] Group Notes (Signed)
Type of Therapy and Topic:  Group Therapy:Gratitude  Participation Level:  Did Not Attend   Description of Group:   In this group, patients shared and discussed the importance of acknowledging the elements in their lives for which they are grateful and how this can positively impact their mood.  The group discussed how bringing the positive elements of their lives to the forefront of their minds can help with recovery from any illness, physical or mental.  An exercise was done as a group in which a list was made of gratitude items in order to encourage participants to consider other potential positives in their lives.  Therapeutic Goals: Patients will identify one or more item for which they are grateful in each of 6 categories:  people, experiences, things, places, skills, and other. Patients will discuss how it is possible to seek out gratitude in even bad situations. Patients will explore other possible items of gratitude that they could remember.   Summary of Patient Progress: NA  Therapeutic Modalities:   Solution-Focused Therapy Activity

## 2023-04-18 NOTE — Plan of Care (Signed)
  Problem: Coping: Goal: Ability to verbalize frustrations and anger appropriately will improve Outcome: Progressing Goal: Ability to demonstrate self-control will improve Outcome: Progressing   Problem: Health Behavior/Discharge Planning: Goal: Identification of resources available to assist in meeting health care needs will improve Outcome: Progressing Goal: Compliance with treatment plan for underlying cause of condition will improve Outcome: Progressing   Problem: Physical Regulation: Goal: Ability to maintain clinical measurements within normal limits will improve Outcome: Progressing

## 2023-04-18 NOTE — Progress Notes (Signed)
Patient ID: Cody Young., male   DOB: 1990-11-25, 32 y.o.   MRN: [id number] Pt presents with depressed mood, affect congruent. Micheal reports he continues to have auditory hallucinations stating '' I am still having voices. I still hear my father's voice telling me to kill myself. '' Pt does continue to report suicidal thoughts stating '' I still have the thoughts I just won't act on them in here. But I am anxious and don't feel any better. '' Pt has been isolative to his room thus far, and encouraged to attend group. Pt did complete self inventory and rates his depression, anxiety at 9/10 on scale 10 being worst 0 being none. Pt rates his hopelessness at 8/10 on scale 10 being worst 0 being none. Pt goal is '' to suppress my anxiety '' pt is safe, will con't to monitor.

## 2023-04-19 DIAGNOSIS — F1010 Alcohol abuse, uncomplicated: Secondary | ICD-10-CM | POA: Diagnosis not present

## 2023-04-19 NOTE — Plan of Care (Signed)
  Problem: Activity: Goal: Interest or engagement in activities will improve Outcome: Progressing Goal: Sleeping patterns will improve Outcome: Progressing   

## 2023-04-19 NOTE — Group Note (Signed)
Recreation Therapy Group Note   Group Topic:Problem Solving  Group Date: 04/19/2023 Start Time: 0935 End Time: 1000 Facilitators: Demonica Farrey-McCall, LRT,CTRS Location: [address] Dayroom   Goal Area(s) Addresses:  Patient will effectively work with peer towards shared goal.  Patient will identify skills used to make activity successful.  Patient will identify how skills used during activity can be used to reach post d/c goals.   Intervention: STEM Activity  Group Description: Stage manager. In teams of 3-5, patients were given 12 plastic drinking straws and an equal length of masking tape. Using the materials provided, patients were asked to build a landing pad to catch a golf ball dropped from approximately 5 feet in the air. All materials were required to be used by the team in their design. LRT facilitated post-activity discussion.  Education: Pharmacist, community, Scientist, physiological, Discharge Planning   Education Outcome: Acknowledges education/In group clarification offered/Needs additional education.    Affect/Mood: N/A   Participation Level: Did not attend    Clinical Observations/Individualized Feedback:     Plan: Continue to engage patient in RT group sessions 2-3x/week.   Valla Pacey-McCall, LRT,CTRS 04/19/2023 12:38 PM

## 2023-04-19 NOTE — Plan of Care (Signed)

## 2023-04-19 NOTE — Progress Notes (Signed)
   04/19/23 2000  Psych Admission Type (Psych Patients Only)  Admission Status Voluntary  Psychosocial Assessment  Patient Complaints Depression;Anxiety  Eye Contact Fair  Facial Expression Anxious  Affect Appropriate to circumstance  Speech Logical/coherent  Interaction Assertive  Motor Activity Slow  Appearance/Hygiene In scrubs  Behavior Characteristics Cooperative  Mood Depressed;Anxious  Aggressive Behavior  Effect No apparent injury  Thought Process  Coherency WDL  Content WDL  Delusions None reported or observed  Perception Hallucinations  Hallucination Auditory;Visual  Judgment Poor  Confusion None  Danger to Self  Current suicidal ideation? Passive  Agreement Not to Harm Self Yes  Description of Agreement verbal  Danger to Others  Danger to Others None reported or observed

## 2023-04-19 NOTE — Group Note (Unsigned)
Date:  04/20/2023 Time:  3:29 AM  Group Topic/Focus:  Wrap-Up Group:   The focus of this group is to help patients review their daily goal of treatment and discuss progress on daily workbooks.    Participation Level:  Minimal  Participation Quality:  Appropriate and Sharing  Affect:  Appropriate  Cognitive:  Appropriate  Insight: Appropriate and Improving  Engagement in Group:  Engaged and Improving  Modes of Intervention:  Activity and Socialization  Additional Comments:  The patient stated that he's having a good day. The patient stated that he had no "SI today and stated that be good". The patient also shared that he "spoke with his mother whom is taking care of his father whom is in hospice care" and the patient stated that has him "a little down". The patient rated his day a 7/10. The patient participated in the group activity at the end of the group.   Kennieth Francois 04/20/2023, 3:29 AM

## 2023-04-19 NOTE — Progress Notes (Signed)
   04/19/23 0800  Psych Admission Type (Psych Patients Only)  Admission Status Voluntary  Psychosocial Assessment  Patient Complaints Anxiety;Depression  Eye Contact Fair  Facial Expression Anxious;Sad  Affect Sad;Depressed;Anxious  Speech Logical/coherent  Interaction Assertive  Motor Activity Slow  Appearance/Hygiene Disheveled;In scrubs  Behavior Characteristics Cooperative  Mood Depressed;Anxious  Thought Process  Coherency WDL  Content WDL  Delusions None reported or observed  Perception Hallucinations  Hallucination Visual;Auditory  Judgment Poor  Confusion None  Danger to Self  Current suicidal ideation? Passive  Agreement Not to Harm Self Yes  Description of Agreement verbal  Danger to Others  Danger to Others None reported or observed

## 2023-04-19 NOTE — [facility] Group Notes (Signed)
Spiritual care group on grief and loss facilitated by Chaplain Dyanne Carrel, [facility]  Group Goal: Support / Education around grief and loss  Members engage in facilitated group support and psycho-social education.  Group Description:  Following introductions and group rules, group members engaged in facilitated group dialogue and support around topic of loss, with particular support around experiences of loss in their lives. Group Identified types of loss (relationships / self / things) and identified patterns, circumstances, and changes that precipitate losses. Reflected on thoughts / feelings around loss, normalized grief responses, and recognized variety in grief experience. Group encouraged individual reflection on safe space and on the coping skills that they are already utilizing.  Group drew on Adlerian / Rogerian and narrative framework  Patient Progress: Cody Young attended group.  Though verbal participation was limited, he remained engaged in the conversation

## 2023-04-19 NOTE — Progress Notes (Signed)
Psychiatric progress note  Patient Identification: Cody Young. MRN:  [id number] Date of Evaluation:  04/19/2023 Chief Complaint:  Alcohol abuse [F10.10] Principal Diagnosis: Alcohol abuse Diagnosis:  Principal Problem:   Alcohol abuse   Reason for admission   The patient is a 32 year old Caucasian male with a history of bipolar disorder and substance use disorder who was admitted with increasing depression and suicidal ideations with a plan to either slit his wrist or hanging himself.  He was recently discharged from this facility on 04/13/2023 with a safety plan which apparently fell through.  Chart review from last 24 hours   Staff reports that the patient remains isolative and depressed.  He continues to endorse passive suicidal ideations and occasional command hallucinations.  He slept 9.5 hours.  Yesterday, the psychiatry team made the following recommendations:  Resume home medications. Begin Abilify 5 mg a day for increased hallucinations Abilify ER 400 mg IM every month BuSpar 10 mg p.o. 3 times daily Guanfacine 2 mg p.o. at bedtime Remeron 30 mg at bedtime Minipress 1 mg at bedtime. NicoDerm patch 21 mg transdermal daily Patient is also on Robaxin as needed Information obtained during interview   The patient was seen and evaluated.  He continues to report feelings of helplessness and hopelessness about his situation and feels that nothing will change.  He is fearful of going back on the streets and does not want to go to the shelter.  He has been making several calls and is hoping to get a response back from [facility] in [place] today.  He has an ACT team. He continues to endorse feelings of helplessness and hopelessness loss of interest in activities and passive suicidal ideations but is able to contract for safety.  The plan is to continue his current medications and added Abilify 5 mg a day for increased hallucinations for the next 2 weeks.  He will then get  his scheduled Abilify Maintena injection.  Associated Signs/Symptoms: Depression Symptoms:  psychomotor retardation, suicidal thoughts with specific plan, (Hypo) Manic Symptoms:  Impulsivity, Anxiety Symptoms:  Excessive Worry, Psychotic Symptoms:  Paranoia, PTSD Symptoms: Negative Total Time spent with patient: 30 minutes  Past Psychiatric History: The patient was admitted to [facility] behavioral health from 10/14 to 04/15/2023.  He returned back for the current admission within 24 hours.  He has multiple prior admissions with a diagnosis of bipolar disorder.  He reports using multiple medications including Abilify, Remeron, Cymbalta, prazosin, BuSpar.  He gives a history of at least 2 suicide attempts in the past.  Past medical history significant for history of OSA but he has not been tested for this.  He denies any history of seizures.  He denies any drug allergies.   Is the patient at risk to self? Yes.    Has the patient been a risk to self in the past 6 months? Yes.    Has the patient been a risk to self within the distant past? Yes.    Is the patient a risk to others? No.  Has the patient been a risk to others in the past 6 months? No.  Has the patient been a risk to others within the distant past? No.   [place] Scale:  Flowsheet Row [facility]) [facility]13/2023 in [facility] Admission (Discharged) [facility]  C-SSRS RISK CATEGORY Low Risk High Risk No Risk  Prior Inpatient Therapy: Yes.   If yes, describe patient had multiple admissions in the past.  Recent discharge on 04/15/2023. Prior Outpatient Therapy: Yes.   If yes, describe noncompliant with outpatient treatment.  Alcohol Screening: Patient refused Alcohol Screening Tool: Yes (''nothing since i left yesterday) 1. How often do you have a drink  containing alcohol?: Never 2. How many drinks containing alcohol do you have on a typical day when you are drinking?: 1 or 2 3. How often do you have six or more drinks on one occasion?: Never AUDIT-C Score: 0 4. How often during the last year have you found that you were not able to stop drinking once you had started?: Never 5. How often during the last year have you failed to do what was normally expected from you because of drinking?: Never 6. How often during the last year have you needed a first drink in the morning to get yourself going after a heavy drinking session?: Never 7. How often during the last year have you had a feeling of guilt of remorse after drinking?: Never 8. How often during the last year have you been unable to remember what happened the night before because you had been drinking?: Never 9. Have you or someone else been injured as a result of your drinking?: No 10. Has a relative or friend or a doctor or another health worker been concerned about your drinking or suggested you cut down?: No Alcohol Use Disorder Identification Test Final Score (AUDIT): 0 Alcohol Brief Interventions/Follow-up: Alcohol education/Brief advice Substance Abuse History in the last 12 months:  Yes.   Consequences of Substance Abuse: Withdrawal Symptoms:   Cramps Diaphoresis Nausea Tremors Previous Psychotropic Medications: Yes  Psychological Evaluations: Yes  Past Medical History:  Past Medical History:  Diagnosis Date   Bipolar 1 disorder (HCC)    Hypertension    Medical history non-contributory    Mood and affect disturbance     Past Surgical History:  Procedure Laterality Date   BRAIN SURGERY     Family History: History reviewed. No pertinent family history. Family Psychiatric  History: According to the records mother has a history of bipolar disorder and maternal grandmother has schizophrenia. Tobacco Screening:  Social History   Tobacco Use  Smoking Status Every Day    Current packs/day: 1.00   Average packs/day: 1 pack/day for 20.0 years (20.0 ttl pk-yrs)   Types: Cigarettes  Smokeless Tobacco Never    BH Tobacco Counseling     Are you interested in Tobacco Cessation Medications?  Yes, implement Nicotene Replacement Protocol Counseled patient on smoking cessation:  Refused/Declined practical counseling Reason Tobacco Screening Not Completed: Patient Refused Screening       Social History:  Social History   Substance and Sexual Activity  Alcohol Use Yes   Alcohol/week: 12.0 standard drinks of alcohol   Types: 12 Cans of beer per week   Comment: Currently drinks mad dogs     Social History   Substance and Sexual Activity  Drug Use Yes   Types: Marijuana, Cocaine, "Crack" cocaine   Comment: deneis current cocaine use, none since last admit    Additional Social History: Marital status: Single Are you sexually active?: No What is your sexual orientation?: Heterosexual Has your sexual activity been affected by drugs, alcohol, medication, or emotional stress?: None reported Does patient have children?: No  Allergies:  No Known Allergies Lab Results:  No results found for this or any previous visit (from the past 48 hour(s)).   Blood Alcohol level:  Lab Results  Component Value Date   ETH <10 04/13/2023   ETH <10 04/04/2023    Metabolic Disorder Labs:  Lab Results  Component Value Date   HGBA1C 5.5 04/06/2023   MPG 111.15 04/06/2023   MPG 103 09/02/2022   No results found for: "PROLACTIN" Lab Results  Component Value Date   CHOL 166 04/06/2023   TRIG 96 04/06/2023   HDL 34 (L) 04/06/2023   CHOLHDL 4.9 04/06/2023   VLDL 19 04/06/2023   LDLCALC 113 (H) 04/06/2023   LDLCALC 132 (H) 09/02/2022    Current Medications: Current Facility-Administered Medications  Medication Dose Route Frequency Provider Last Rate Last Admin   acetaminophen (TYLENOL) tablet 650 mg  650 mg Oral Q6H PRN  Sindy Guadeloupe, NP   650 mg at 04/14/23 1555   alum & mag hydroxide-simeth (MAALOX/MYLANTA) 200-200-20 MG/5ML suspension 30 mL  30 mL Oral Q4H PRN Sindy Guadeloupe, NP   30 mL at 04/18/23 1131   ARIPiprazole (ABILIFY) tablet 5 mg  5 mg Oral Daily Rex Kras, MD   5 mg at 04/19/23 0757   ARIPiprazole ER (ABILIFY MAINTENA) 400 MG prefilled syringe 400 mg  400 mg Intramuscular Q30 days Rex Kras, MD   400 mg at 04/17/23 1011   busPIRone (BUSPAR) tablet 10 mg  10 mg Oral TID Rex Kras, MD   10 mg at 04/19/23 0757   guanFACINE (INTUNIV) ER tablet 2 mg  2 mg Oral QHS Rex Kras, MD   2 mg at 04/18/23 2128   hydrOXYzine (ATARAX) tablet 25 mg  25 mg Oral TID PRN Rex Kras, MD   25 mg at 04/17/23 2118   OLANZapine zydis (ZYPREXA) disintegrating tablet 5 mg  5 mg Oral Q8H PRN Sindy Guadeloupe, NP       And   LORazepam (ATIVAN) tablet 1 mg  1 mg Oral PRN Sindy Guadeloupe, NP       And   ziprasidone (GEODON) injection 20 mg  20 mg Intramuscular PRN Sindy Guadeloupe, NP       magnesium hydroxide (MILK OF MAGNESIA) suspension 30 mL  30 mL Oral Daily PRN Sindy Guadeloupe, NP       methocarbamol (ROBAXIN) tablet 500 mg  500 mg Oral Q6H PRN Massengill, Harrold Donath, MD   500 mg at 04/18/23 1600   mirtazapine (REMERON) tablet 30 mg  30 mg Oral QHS Rex Kras, MD   30 mg at 04/18/23 2128   multivitamin with minerals tablet 1 tablet  1 tablet Oral Daily Rex Kras, MD   1 tablet at 04/19/23 0757   nicotine (NICODERM CQ - dosed in mg/24 hours) patch 21 mg  21 mg Transdermal Daily Rex Kras, MD   21 mg at 04/19/23 0757   ondansetron (ZOFRAN-ODT) disintegrating tablet 4 mg  4 mg Oral Q8H PRN Rex Kras, MD   4 mg at 04/18/23 2130   pantoprazole (PROTONIX) EC tablet 40 mg  40 mg Oral Daily Rex Kras, MD   40 mg at 04/19/23 0757   prazosin (MINIPRESS) capsule 1 mg  1 mg Oral QHS Rex Kras, MD   1 mg at 04/18/23 2128   traZODone (DESYREL) tablet 50 mg  50 mg Oral QHS PRN  Rex Kras, MD       PTA Medications: Medications Prior to Admission  Medication Sig Dispense Refill Last  Dose   ARIPiprazole ER (ABILIFY MAINTENA) 400 MG PRSY prefilled syringe Inject 400 mg into the muscle every 30 (thirty) days.      busPIRone (BUSPAR) 10 MG tablet Take 1 tablet (10 mg total) by mouth 3 (three) times daily. 90 tablet 0    guanFACINE (INTUNIV) 2 MG TB24 ER tablet Take 2 mg by mouth at bedtime.      hydrOXYzine (ATARAX) 25 MG tablet Take 1 tablet (25 mg total) by mouth 3 (three) times daily as needed for anxiety. 30 tablet 0    mirtazapine (REMERON) 30 MG tablet Take 1 tablet (30 mg total) by mouth at bedtime. 30 tablet 0    Multiple Vitamin (MULTIVITAMIN WITH MINERALS) TABS tablet Take 1 tablet by mouth daily.      nicotine (NICODERM CQ - DOSED IN MG/24 HOURS) 21 mg/24hr patch Place 1 patch (21 mg total) onto the skin daily. 28 patch 0    oxyCODONE (OXY IR/ROXICODONE) 5 MG immediate release tablet Take 5 mg by mouth every 4 (four) hours as needed.      pantoprazole (PROTONIX) 40 MG tablet Take 1 tablet (40 mg total) by mouth daily. 30 tablet 0    prazosin (MINIPRESS) 1 MG capsule Take 1 capsule (1 mg total) by mouth at bedtime. 30 capsule 0    traZODone (DESYREL) 50 MG tablet Take 1 tablet (50 mg total) by mouth at bedtime as needed for sleep. 30 tablet 0     Musculoskeletal: Strength & Muscle Tone: within normal limits Gait & Station: normal Patient leans: N/A            Psychiatric Specialty Exam:  Presentation  General Appearance:  Casual; Disheveled  Eye Contact: Fair  Speech: Clear and Coherent; Slow  Speech Volume: Decreased  Handedness: Right   Mood and Affect  Mood: Anxious; Depressed  Affect: Restricted   Thought Process  Thought Processes: Linear  Duration of Psychotic Symptoms:N/A Past Diagnosis of Schizophrenia or Psychoactive disorder: No  Descriptions of Associations:Intact  Orientation:Full (Time, Place and  Person)  Thought Content:Perseveration; Rumination  Hallucinations:Hallucinations: Auditory Description of Auditory Hallucinations: Reports that he is hearing voices telling him to kill himself.   Ideas of Reference:Paranoia  Suicidal Thoughts:Suicidal Thoughts: Yes, Passive SI Active Intent and/or Plan: With Intent; With Plan SI Passive Intent and/or Plan: With Intent; With Plan  Homicidal Thoughts:Homicidal Thoughts: No   Sensorium  Memory: Immediate Fair; Remote Fair; Recent Fair  Judgment: Fair  Insight: Fair   Chartered certified accountant: Fair  Attention Span: Fair  Recall: [employer] of Knowledge: Fair  Language: Fair   Psychomotor Activity  Psychomotor Activity: Psychomotor Activity: Normal   Assets  Assets: Manufacturing systems engineer; Desire for Improvement   Sleep  Sleep: Sleep: Good Number of Hours of Sleep: 9.5    Physical Exam: Physical Exam Constitutional:      Appearance: Normal appearance.  Neurological:     General: No focal deficit present.     Mental Status: He is alert and oriented to person, place, and time. Mental status is at baseline.    Review of Systems  Psychiatric/Behavioral:  Positive for depression and suicidal ideas.   All other systems reviewed and are negative.  Blood pressure 107/83, pulse 65, temperature 98 F (36.7 C), temperature source Oral, resp. rate 16, height 6\' 2"  (1.88 m), weight 123.6 kg, SpO2 98%. Body mass index is 34.97 kg/m.  Treatment Plan Summary: Daily contact with patient to assess and evaluate symptoms and progress in  treatment and Medication management   ASSESSMENT:   Diagnoses / Active Problems: Bipolar disorder, current episode is depressed GAD Stimulant use disorder Alcohol use disorder Sedative-hypnotic use disorder Opioid use disorder Tobacco use disorder   PLAN: Safety and Monitoring:             --  Voluntary admission to inpatient psychiatric unit for safety,  stabilization and treatment             -- Daily contact with patient to assess and evaluate symptoms and progress in treatment             -- Patient's case to be discussed in multi-disciplinary team meeting             -- Observation Level : q15 minute checks             -- Vital signs:  q12 hours             -- Precautions: suicide, elopement, and assault   2. Psychiatric Diagnoses and Treatment:               -Resume medications that were prescribed on discharge on 04/15/2023. -These include the following: -Abilify Maintena 400 mg IM every 30 days.  Verify last dose given. -BuSpar 10 mg p.o. 3 times daily -Guanfacine 2 mg TD 24 ER tablet at bedtime -Minipress 1 mg at bedtime -Trazodone 50 mg at bedtime as needed -Remeron 30 mg at bedtime -Continue Abilify 5 mg a day for 14 days   --  The risks/benefits/side-effects/alternatives to this medication were discussed in detail with the patient and time was given for questions. The patient consents to medication trial.                -- Metabolic profile and EKG monitoring obtained while on an atypical antipsychotic (BMI: Lipid Panel: HbgA1c: QTc:)              -- Encouraged patient to participate in unit milieu and in scheduled group therapies              -- Short Term Goals: Ability to identify changes in lifestyle to reduce recurrence of condition will improve, Ability to verbalize feelings will improve, Ability to disclose and discuss suicidal ideas, Ability to demonstrate self-control will improve, Ability to identify and develop effective coping behaviors will improve, Ability to maintain clinical measurements within normal limits will improve, Compliance with prescribed medications will improve, and Ability to identify triggers associated with substance abuse/mental health issues will improve             -- Long Term Goals: Improvement in symptoms so as ready for discharge                3. Medical Issues Being Addressed:               Tobacco Use Disorder             -- Nicotine patch 21mg /24 hours ordered plus nicotine gum PRN              -- Smoking cessation encouraged   4. Discharge Planning:              -- Social work and case management to assist with discharge planning and identification of hospital follow-up needs prior to discharge             -- Estimated LOS: 5-7 days with appropriate disposition and safety plan.             --  Discharge Concerns: Need to establish a safety plan; Medication compliance and effectiveness             -- Discharge Goals: Return home with outpatient referrals for mental health follow-up including medication management/psychotherapy   I certify that inpatient services furnished can reasonably be expected to improve the patient's condition.    Rex Kras, MD 10/28/202410:32 AM Patient ID: Colin Broach., male   DOB: 1990-09-09, 32 y.o.   MRN: [id number] Patient ID: Aasin Denhart., male   DOB: Nov 17, 1990, 32 y.o.   MRN: [id number] Patient ID: Olney Lafazia., male   DOB: 29-Jun-1990, 32 y.o.   MRN: [id number] Patient ID: Ronelle Attig., male   DOB: 24-Feb-1991, 32 y.o.   MRN: [id number] Patient ID: Geramie Mortazavi., male   DOB: [birth year], 32 y.o.   MRN: [id number]

## 2023-04-20 DIAGNOSIS — F1010 Alcohol abuse, uncomplicated: Secondary | ICD-10-CM | POA: Diagnosis not present

## 2023-04-20 NOTE — [facility] Suicide Risk Assessment (Signed)
[facility] INPATIENT:  Family/Significant Other Suicide Prevention Education  Suicide Prevention Education:  Education Completed; Carvell Lamberg ( mom ) [phone],  (name of family member/significant other) has been identified by the patient as the family member/significant other with whom the patient will be residing, and identified as the person(s) who will aid the patient in the event of a mental health crisis (suicidal ideations/suicide attempt).  With written consent from the patient, the family member/significant other has been provided the following suicide prevention education, prior to the and/or following the discharge of the patient.  CSW spoke with patient mom and completed safety planning. Mom stated that patient needed to go to a 21-28 day program before returning back home since she is in the process of planning a funeral for her husband, which is patient step dad. Mom stated that patient can come home after everything is finalized and out the home so it does not trigger the patient since his step dad is dying. However, CSW informed mom that patient was accepted into [facility] and would need his debit card to pay fee and ID and mom stated, " the card does not work and I don't know if he has the money but he will need to go somewhere else, I have called his insurance company and there are other rehab options, they will pay 100% " CSW asked for the options to start referrals for patient and mom directed for patient to call his insurance" . Patient stated that he had all the numbers he needed and will reach out to them. Patient has no access to guns or weapons.   The suicide prevention education provided includes the following: Suicide risk factors Suicide prevention and interventions National Suicide Hotline telephone number [facility] assessment telephone number [facility] Emergency Assistance 911 [facility] and/or Residential Mobile Crisis Unit telephone number  Request made  of family/significant other to: Remove weapons (e.g., guns, rifles, knives), all items previously/currently identified as safety concern.   Remove drugs/medications (over-the-counter, prescriptions, illicit drugs), all items previously/currently identified as a safety concern.  The family member/significant other verbalizes understanding of the suicide prevention education information provided.  The family member/significant other agrees to remove the items of safety concern listed above.  Isabella Bowens 04/20/2023, 1:18 PM

## 2023-04-20 NOTE — Progress Notes (Signed)
DAR NOTE: Patient presents with anxious affect and depressed mood.  Denies suicidal thoughts, pain, auditory and visual hallucinations.  Rates depression at 9, hopelessness at 8, and anxiety at 10.  Maintained on routine safety checks.  Medications given as prescribed.  PRN medication: Zofran, Maalox and Vistaril given for complaint of nausea, indigestion and anxiety with good effect.  Support and encouragement offered as needed.  Attended group and participated.  States goal for today is "to be discharged."  Patient visible in milieu with minimal interaction.  Patient is safe on and off the unit.

## 2023-04-20 NOTE — Group Note (Signed)
Date:  04/20/2023 Time:  9:30 AM  Group Topic/Focus:  Goals Group:   The focus of this group is to help patients establish daily goals to achieve during treatment and discuss how the patient can incorporate goal setting into their daily lives to aide in recovery.    Participation Level:  Minimal  Participation Quality:  Appropriate  Affect:  Appropriate  Cognitive:  Appropriate  Insight: Appropriate  Engagement in Group:  Engaged  Modes of Intervention:  Discussion  Additional Comments:    Sira Adsit D Dailey Buccheri 04/20/2023, 9:30 AM

## 2023-04-20 NOTE — Progress Notes (Signed)
Psychiatric progress note  Patient Identification: Cody Young. MRN:  [id number] Date of Evaluation:  04/20/2023 Chief Complaint:  Alcohol abuse [F10.10] Principal Diagnosis: Alcohol abuse Diagnosis:  Principal Problem:   Alcohol abuse   Reason for admission   The patient is a 31 year old Caucasian male with a history of bipolar disorder and substance use disorder who was admitted with increasing depression and suicidal ideations with a plan to either slit his wrist or hanging himself.  He was recently discharged from this facility on 04/13/2023 with a safety plan which apparently fell through.  Chart review from last 24 hours   Staff reports that the patient remains isolative and depressed.  Patient denied any SI or hallucinations to staff but reported to this provider as noted below.  Staff reported patient slept 9 hours Less anxious as needed Atarax used average once daily for anxiety  Information obtained during interview   Patient was evaluated on the unit today he reports admission to the hospital was on the same day of discharge as he left in good spirits but had worsening depression and SI right after he left "it was triggered by homelessness" he reports on and off passive SI with last time yesterday wishing self dead but denies active SI intention or plan to harm self in the hospital but unable to contract for safety outside the hospital.  He denies HI or AVH.  He reports attending groups.  He reports good sleep and appetite.  He denies side effect to medications.  He reports interest in going to substance use rehab and he plans to call programs today, will follow.  He denies craving to alcohol or cocaine which he had been using prior to admission.  He does not present in withdrawals.   The plan is to continue his current medications and added Abilify 5 mg a day for increased hallucinations for the next 2 weeks.  He will then get his scheduled Abilify Maintena  injection.  Associated Signs/Symptoms: Depression Symptoms:  psychomotor retardation, suicidal thoughts with specific plan, (Hypo) Manic Symptoms:  Impulsivity, Anxiety Symptoms:  Excessive Worry, Psychotic Symptoms:  Paranoia, PTSD Symptoms: Negative Total Time spent with patient: 30 minutes  Past Psychiatric History: The patient was admitted to [facility] behavioral health from 10/14 to 04/15/2023.  He returned back for the current admission within 24 hours.  He has multiple prior admissions with a diagnosis of bipolar disorder.  He reports using multiple medications including Abilify, Remeron, Cymbalta, prazosin, BuSpar.  He gives a history of at least 2 suicide attempts in the past.  Past medical history significant for history of OSA but he has not been tested for this.  He denies any history of seizures.  He denies any drug allergies.   Is the patient at risk to self? Yes.    Has the patient been a risk to self in the past 6 months? Yes.    Has the patient been a risk to self within the distant past? Yes.    Is the patient a risk to others? No.  Has the patient been a risk to others in the past 6 months? No.  Has the patient been a risk to others within the distant past? No.   [place] Scale:  Flowsheet Row [facility]) [facility] from 04/13/2023 in [facility] Department at [facility] Admission (Discharged) [facility]  C-SSRS RISK CATEGORY Low Risk High  Risk No Risk        Prior Inpatient Therapy: Yes.   If yes, describe patient had multiple admissions in the past.  Recent discharge on 04/15/2023. Prior Outpatient Therapy: Yes.   If yes, describe noncompliant with outpatient treatment.  Alcohol Screening: Patient refused Alcohol Screening Tool: Yes (''nothing since i left yesterday) 1. How often do you have a drink containing alcohol?: Never 2.  How many drinks containing alcohol do you have on a typical day when you are drinking?: 1 or 2 3. How often do you have six or more drinks on one occasion?: Never AUDIT-C Score: 0 4. How often during the last year have you found that you were not able to stop drinking once you had started?: Never 5. How often during the last year have you failed to do what was normally expected from you because of drinking?: Never 6. How often during the last year have you needed a first drink in the morning to get yourself going after a heavy drinking session?: Never 7. How often during the last year have you had a feeling of guilt of remorse after drinking?: Never 8. How often during the last year have you been unable to remember what happened the night before because you had been drinking?: Never 9. Have you or someone else been injured as a result of your drinking?: No 10. Has a relative or friend or a doctor or another health worker been concerned about your drinking or suggested you cut down?: No Alcohol Use Disorder Identification Test Final Score (AUDIT): 0 Alcohol Brief Interventions/Follow-up: Alcohol education/Brief advice Substance Abuse History in the last 12 months:  Yes.   Consequences of Substance Abuse: Withdrawal Symptoms:   Cramps Diaphoresis Nausea Tremors Previous Psychotropic Medications: Yes  Psychological Evaluations: Yes  Past Medical History:  Past Medical History:  Diagnosis Date   Bipolar 1 disorder (HCC)    Hypertension    Medical history non-contributory    Mood and affect disturbance     Past Surgical History:  Procedure Laterality Date   BRAIN SURGERY     Family History: History reviewed. No pertinent family history. Family Psychiatric  History: According to the records mother has a history of bipolar disorder and maternal grandmother has schizophrenia. Tobacco Screening:  Social History   Tobacco Use  Smoking Status Every Day   Current packs/day: 1.00   Average  packs/day: 1 pack/day for 20.0 years (20.0 ttl pk-yrs)   Types: Cigarettes  Smokeless Tobacco Never    BH Tobacco Counseling     Are you interested in Tobacco Cessation Medications?  Yes, implement Nicotene Replacement Protocol Counseled patient on smoking cessation:  Refused/Declined practical counseling Reason Tobacco Screening Not Completed: Patient Refused Screening       Social History:  Social History   Substance and Sexual Activity  Alcohol Use Yes   Alcohol/week: 12.0 standard drinks of alcohol   Types: 12 Cans of beer per week   Comment: Currently drinks mad dogs     Social History   Substance and Sexual Activity  Drug Use Yes   Types: Marijuana, Cocaine, "Crack" cocaine   Comment: deneis current cocaine use, none since last admit    Additional Social History: Marital status: Single Are you sexually active?: No What is your sexual orientation?: Heterosexual Has your sexual activity been affected by drugs, alcohol, medication, or emotional stress?: None reported Does patient have children?: No  Allergies:  No Known Allergies Lab Results:  No results found for this or any previous visit (from the past 48 hour(s)).   Blood Alcohol level:  Lab Results  Component Value Date   ETH <10 04/13/2023   ETH <10 04/04/2023    Metabolic Disorder Labs:  Lab Results  Component Value Date   HGBA1C 5.5 04/06/2023   MPG 111.15 04/06/2023   MPG 103 09/02/2022   No results found for: "PROLACTIN" Lab Results  Component Value Date   CHOL 166 04/06/2023   TRIG 96 04/06/2023   HDL 34 (L) 04/06/2023   CHOLHDL 4.9 04/06/2023   VLDL 19 04/06/2023   LDLCALC 113 (H) 04/06/2023   LDLCALC 132 (H) 09/02/2022    Current Medications: Current Facility-Administered Medications  Medication Dose Route Frequency Provider Last Rate Last Admin   acetaminophen (TYLENOL) tablet 650 mg  650 mg Oral Q6H PRN Sindy Guadeloupe, NP   650 mg at 04/14/23 1555    alum & mag hydroxide-simeth (MAALOX/MYLANTA) 200-200-20 MG/5ML suspension 30 mL  30 mL Oral Q4H PRN Sindy Guadeloupe, NP   30 mL at 04/19/23 2131   ARIPiprazole (ABILIFY) tablet 5 mg  5 mg Oral Daily Rex Kras, MD   5 mg at 04/20/23 0752   ARIPiprazole ER (ABILIFY MAINTENA) 400 MG prefilled syringe 400 mg  400 mg Intramuscular Q30 days Rex Kras, MD   400 mg at 04/17/23 1011   busPIRone (BUSPAR) tablet 10 mg  10 mg Oral TID Rex Kras, MD   10 mg at 04/20/23 1201   guanFACINE (INTUNIV) ER tablet 2 mg  2 mg Oral QHS Rex Kras, MD   2 mg at 04/19/23 2130   hydrOXYzine (ATARAX) tablet 25 mg  25 mg Oral TID PRN Rex Kras, MD   25 mg at 04/19/23 1350   OLANZapine zydis (ZYPREXA) disintegrating tablet 5 mg  5 mg Oral Q8H PRN Sindy Guadeloupe, NP       And   LORazepam (ATIVAN) tablet 1 mg  1 mg Oral PRN Sindy Guadeloupe, NP       And   ziprasidone (GEODON) injection 20 mg  20 mg Intramuscular PRN Sindy Guadeloupe, NP       magnesium hydroxide (MILK OF MAGNESIA) suspension 30 mL  30 mL Oral Daily PRN Sindy Guadeloupe, NP       methocarbamol (ROBAXIN) tablet 500 mg  500 mg Oral Q6H PRN Massengill, Harrold Donath, MD   500 mg at 04/20/23 1052   mirtazapine (REMERON) tablet 30 mg  30 mg Oral QHS Rex Kras, MD   30 mg at 04/19/23 2130   multivitamin with minerals tablet 1 tablet  1 tablet Oral Daily Rex Kras, MD   1 tablet at 04/20/23 1610   nicotine (NICODERM CQ - dosed in mg/24 hours) patch 21 mg  21 mg Transdermal Daily Rex Kras, MD   21 mg at 04/20/23 0753   ondansetron (ZOFRAN-ODT) disintegrating tablet 4 mg  4 mg Oral Q8H PRN Rex Kras, MD   4 mg at 04/20/23 1052   pantoprazole (PROTONIX) EC tablet 40 mg  40 mg Oral Daily Rex Kras, MD   40 mg at 04/20/23 0752   prazosin (MINIPRESS) capsule 1 mg  1 mg Oral QHS Rex Kras, MD   1 mg at 04/19/23 2130   traZODone (DESYREL) tablet 50 mg  50 mg Oral QHS PRN Rex Kras, MD       PTA  Medications: Medications Prior to Admission  Medication Sig Dispense Refill Last  Dose   ARIPiprazole ER (ABILIFY MAINTENA) 400 MG PRSY prefilled syringe Inject 400 mg into the muscle every 30 (thirty) days.      busPIRone (BUSPAR) 10 MG tablet Take 1 tablet (10 mg total) by mouth 3 (three) times daily. 90 tablet 0    guanFACINE (INTUNIV) 2 MG TB24 ER tablet Take 2 mg by mouth at bedtime.      hydrOXYzine (ATARAX) 25 MG tablet Take 1 tablet (25 mg total) by mouth 3 (three) times daily as needed for anxiety. 30 tablet 0    mirtazapine (REMERON) 30 MG tablet Take 1 tablet (30 mg total) by mouth at bedtime. 30 tablet 0    Multiple Vitamin (MULTIVITAMIN WITH MINERALS) TABS tablet Take 1 tablet by mouth daily.      nicotine (NICODERM CQ - DOSED IN MG/24 HOURS) 21 mg/24hr patch Place 1 patch (21 mg total) onto the skin daily. 28 patch 0    oxyCODONE (OXY IR/ROXICODONE) 5 MG immediate release tablet Take 5 mg by mouth every 4 (four) hours as needed.      pantoprazole (PROTONIX) 40 MG tablet Take 1 tablet (40 mg total) by mouth daily. 30 tablet 0    prazosin (MINIPRESS) 1 MG capsule Take 1 capsule (1 mg total) by mouth at bedtime. 30 capsule 0    traZODone (DESYREL) 50 MG tablet Take 1 tablet (50 mg total) by mouth at bedtime as needed for sleep. 30 tablet 0     Musculoskeletal: Strength & Muscle Tone: within normal limits Gait & Station: normal Patient leans: N/A            Psychiatric Specialty Exam:  Presentation  General Appearance:  Casual; Disheveled  Eye Contact: Fair  Speech: Clear and Coherent; Slow  Speech Volume: Decreased  Handedness: Right   Mood and Affect  Mood: Anxious; Depressed  Affect: Restricted   Thought Process  Thought Processes: Linear  Duration of Psychotic Symptoms:N/A Past Diagnosis of Schizophrenia or Psychoactive disorder: No  Descriptions of Associations:Intact  Orientation:Full (Time, Place and Person)  Thought  Content:Perseveration; Rumination  Hallucinations:Hallucinations: Auditory Description of Auditory Hallucinations: Reports that he is hearing voices telling him to kill himself.   Ideas of Reference:Paranoia  Suicidal Thoughts: Admits to passive SI wishing self dead but denies active SI intention or plan.  Homicidal Thoughts:Homicidal Thoughts: No   Sensorium  Memory: Immediate Fair; Remote Fair; Recent Fair  Judgment: Fair  Insight: Fair   Chartered certified accountant: Fair  Attention Span: Fair  Recall: [employer] of Knowledge: Fair  Language: Fair   Psychomotor Activity  Psychomotor Activity: Psychomotor Activity: Normal   Assets  Assets: Communication Skills; Desire for Improvement   Sleep  Sleep: Sleep: Good Number of Hours of Sleep: 9.5    Physical Exam: Physical Exam Vitals and nursing note reviewed.  Constitutional:      Appearance: Normal appearance.  Neurological:     General: No focal deficit present.     Mental Status: He is alert and oriented to person, place, and time. Mental status is at baseline.    Review of Systems  Psychiatric/Behavioral:  Positive for depression and suicidal ideas.   All other systems reviewed and are negative.  Blood pressure 117/82, pulse (!) 59, temperature (!) 97.5 F (36.4 C), temperature source Oral, resp. rate 18, height 6\' 2"  (1.88 m), weight 123.6 kg, SpO2 100%. Body mass index is 34.97 kg/m.  Treatment Plan Summary: Daily contact with patient to assess and evaluate symptoms and progress  in treatment and Medication management   ASSESSMENT:   Diagnoses / Active Problems: Bipolar disorder, current episode is depressed GAD Stimulant use disorder Alcohol use disorder Sedative-hypnotic use disorder Opioid use disorder Tobacco use disorder   PLAN: Safety and Monitoring:             --  Voluntary admission to inpatient psychiatric unit for safety, stabilization and treatment              -- Daily contact with patient to assess and evaluate symptoms and progress in treatment             -- Patient's case to be discussed in multi-disciplinary team meeting             -- Observation Level : q15 minute checks             -- Vital signs:  q12 hours             -- Precautions: suicide, elopement, and assault   2. Psychiatric Diagnoses and Treatment:               -Resume medications that were prescribed on discharge on 04/15/2023. -These include the following: -Abilify Maintena 400 mg IM every 30 days.  Verify last dose given. -BuSpar 10 mg p.o. 3 times daily -Guanfacine 2 mg TD 24 ER tablet at bedtime -Minipress 1 mg at bedtime -Trazodone 50 mg at bedtime as needed -Remeron 30 mg at bedtime -Continue Abilify 5 mg a day for 14 days   --  The risks/benefits/side-effects/alternatives to this medication were discussed in detail with the patient and time was given for questions. The patient consents to medication trial.                -- Metabolic profile and EKG monitoring obtained while on an atypical antipsychotic (BMI: Lipid Panel: HbgA1c: QTc:)              -- Encouraged patient to participate in unit milieu and in scheduled group therapies              -- Short Term Goals: Ability to identify changes in lifestyle to reduce recurrence of condition will improve, Ability to verbalize feelings will improve, Ability to disclose and discuss suicidal ideas, Ability to demonstrate self-control will improve, Ability to identify and develop effective coping behaviors will improve, Ability to maintain clinical measurements within normal limits will improve, Compliance with prescribed medications will improve, and Ability to identify triggers associated with substance abuse/mental health issues will improve             -- Long Term Goals: Improvement in symptoms so as ready for discharge                3. Medical Issues Being Addressed:              Tobacco Use Disorder              -- Nicotine patch 21mg /24 hours ordered plus nicotine gum PRN              -- Smoking cessation encouraged   4. Discharge Planning:              -- Social work and case management to assist with discharge planning and identification of hospital follow-up needs prior to discharge             -- Estimated LOS: 5-7 days with appropriate disposition and safety plan.             --  Discharge Concerns: Need to establish a safety plan; Medication compliance and effectiveness             -- Discharge Goals: Return home with outpatient referrals for mental health follow-up including medication management/psychotherapy   I certify that inpatient services furnished can reasonably be expected to improve the patient's condition.    Sarita Bottom, MD 10/29/202412:09 PM Patient ID: Colin Broach., male   DOB: 05-13-1991, 32 y.o.   MRN: [id number] Patient ID: Brandonn Fleury., male   DOB: 03/10/1991, 32 y.o.   MRN: [id number] Patient ID: Deniz Tweedy., male   DOB: [birth year], 32 y.o.   MRN: [id number] Patient ID: Nahshon Geno., male   DOB: Oct 21, 1990, 32 y.o.   MRN: [id number] Patient ID: Deontae Boxberger., male   DOB: Jan 03, 1991, 32 y.o.   MRN: [id number]

## 2023-04-20 NOTE — Group Note (Signed)
[facility] LCSW Group Therapy Note   Group Date: 04/20/2023 Start Time: 1055 End Time: 1145   Type of Therapy/Topic:  Group Therapy:  Emotion Regulation  Participation Level:  Active   Mood: pt  was engaged  Description of Group:    The purpose of this group is to assist patients in learning to regulate negative emotions and experience positive emotions. Patients will be guided to discuss ways in which they have been vulnerable to their negative emotions. These vulnerabilities will be juxtaposed with experiences of positive emotions or situations, and patients challenged to use positive emotions to combat negative ones. Special emphasis will be placed on coping with negative emotions in conflict situations, and patients will process healthy conflict resolution skills.  Therapeutic Goals: Patient will identify two positive emotions or experiences to reflect on in order to balance out negative emotions:  Patient will label two or more emotions that they find the most difficult to experience:  Patient will be able to demonstrate positive conflict resolution skills through discussion or role plays:   Summary of Patient Progress: Pt was engaged during session      Therapeutic Modalities:   Cognitive Behavioral Therapy Feelings Identification Dialectical Behavioral Therapy   Jozlynn Plaia S Griselda Bramblett, LCSW

## 2023-04-20 NOTE — Progress Notes (Signed)
D) Pt received calm, visible, participating in milieu, and in no acute distress. Pt A & O x4. Pt endorses passive SI, pt reports hx of A/ VH ,depression and anxiety  but denies HI and pain at this time. pain at this time. A) Pt encouraged to drink fluids. Pt encouraged to come to staff with needs. Pt encouraged to attend and participate in groups. Pt encouraged to set reachable goals.  R) Pt remained safe on unit, in no acute distress, will continue to assess.     04/20/23 2200  Psych Admission Type (Psych Patients Only)  Admission Status Voluntary  Psychosocial Assessment  Patient Complaints Anxiety;Depression  Eye Contact Fair  Facial Expression Anxious  Affect Appropriate to circumstance  Speech Logical/coherent  Interaction Assertive  Motor Activity Slow  Appearance/Hygiene In scrubs  Behavior Characteristics Cooperative  Mood Depressed;Anxious  Thought Process  Coherency WDL  Content WDL  Delusions None reported or observed  Perception Hallucinations  Hallucination Auditory;Visual  Judgment Poor  Confusion None  Danger to Self  Current suicidal ideation? Passive  Agreement Not to Harm Self Yes  Description of Agreement verbal  Danger to Others  Danger to Others None reported or observed

## 2023-04-20 NOTE — Group Note (Signed)
Recreation Therapy Group Note   Group Topic:Animal Assisted Therapy   Group Date: 04/20/2023 Start Time: 0945 End Time: 1030 Facilitators: Lashica Hannay-McCall, LRT,CTRS Location: [address] Dayroom   Animal-Assisted Activity (AAA) Program Checklist/Progress Notes Patient Eligibility Criteria Checklist & Daily Group note for Rec Tx Intervention  AAA/T Program Assumption of Risk Form signed by Patient/ or Parent Legal Guardian Yes  Patient understands his/her participation is voluntary Yes  Education: Charity fundraiser, Appropriate Animal Interaction   Education Outcome: Acknowledges education.    Clinical Observations/Individualized Feedback: Pet therapy did not occur today due to therapy dog being under the weather. Pet therapy with resume next week at original time.    Plan: Continue to engage patient in RT group sessions 2-3x/week.   Delorus Langwell-McCall, LRT,CTRS 04/20/2023 12:50 PM

## 2023-04-20 NOTE — Plan of Care (Signed)
?  Problem: Education: ?Goal: Mental status will improve ?Outcome: Progressing ?Goal: Verbalization of understanding the information provided will improve ?Outcome: Progressing ?  ?

## 2023-04-20 NOTE — Plan of Care (Signed)

## 2023-04-20 NOTE — Group Note (Unsigned)
Date:  04/21/2023 Time:  5:22 AM  Group Topic/Focus:  Wrap-Up Group:   The focus of this group is to help patients review their daily goal of treatment and discuss progress on daily workbooks.    Participation Level:  Minimal  Participation Quality:  Appropriate and Sharing  Affect:  Appropriate  Cognitive:  Appropriate  Insight: Appropriate and Limited  Engagement in Group:  Engaged and Limited  Modes of Intervention:  Activity and Socialization  Additional Comments:  The patient rated his day a 8/10. The patient stated that he is hoping for discharge on tomorrow. The patient stated that he is trying to remain "optimistic" when it comes to things but patient did not specify. There was some participation in the group activity at the end of the group.   Cody Young 04/21/2023, 5:22 AM

## 2023-04-21 ENCOUNTER — Encounter (HOSPITAL_COMMUNITY): Payer: Self-pay

## 2023-04-21 NOTE — Progress Notes (Addendum)
[facility] LCSW Progress Note   04/21/2023    2:43 PM  Cody Young.      Type of Note: [facility]   CSW spoke with patient earlier this morning and asked about [facility] and patient stated that he was accepted and had to call them the day of DC. CSW asked about the fee and patient shared that his mother was bring the money to hospital for him to pay them . CSW asked if [facility] would provide transportation or if he would need a ride and patient stated that he will call them back to see. CSW will continue to assist.     Signed:   Jacob Moores, MSW, LCSWA 04/21/2023 2:43 PM  [facility] LCSW Progress Note   04/21/2023    3:09 PM  Cody Young.      Type of Note: Check in with Patient  Patient shared that [facility] told him that he will provide address to location on tomorrow once he is DC so CSW can provide transportation. Patient also stated that his mom already dropped off the money for him to pay [facility]. Patient has no questions and thankful for assistance. CSW will continue to assist.     Signed:   Jacob Moores, MSW, LCSWA 04/21/2023 3:09 PM

## 2023-04-21 NOTE — Progress Notes (Signed)
   04/21/23 2305  Psych Admission Type (Psych Patients Only)  Admission Status Voluntary  Psychosocial Assessment  Patient Complaints Anxiety;Depression  Eye Contact Fair  Facial Expression Anxious  Affect Appropriate to circumstance  Speech Logical/coherent  Interaction Assertive  Motor Activity Slow  Appearance/Hygiene In scrubs  Behavior Characteristics Anxious  Mood Depressed;Anxious  Thought Process  Coherency WDL  Content WDL  Delusions None reported or observed  Perception WDL  Hallucination None reported or observed  Judgment Limited  Confusion Mild  Danger to Self  Current suicidal ideation? Denies  Self-Injurious Behavior No self-injurious ideation or behavior indicators observed or expressed   Agreement Not to Harm Self Yes  Description of Agreement verbal  Danger to Others  Danger to Others None reported or observed  Danger to Others Abnormal  Harmful Behavior to others No threats or harm toward other people  Destructive Behavior No threats or harm toward property

## 2023-04-21 NOTE — Progress Notes (Signed)
I assumed care for Mr Cody Young at about 07:30. Initially resting in bed, in no apparent distress. Vital rechecked, WNL. No behavioral problems thus far, denied any avh/hi/si at this time, received prn Zofran for nausea, effective on follow up. Pt attended am groups, has been compliant with care. Hygiene appropriate. He is being monitored as ordered.

## 2023-04-21 NOTE — Plan of Care (Signed)
  Problem: Education: Goal: Knowledge of [place] [facility] Education information/materials will improve Outcome: Progressing   

## 2023-04-21 NOTE — BH IP Treatment Plan (Signed)
Interdisciplinary Treatment and Diagnostic Plan Update  04/21/2023 Time of Session: 401U Cody Young. MRN: [id number]  Principal Diagnosis: Alcohol abuse  Secondary Diagnoses: Principal Problem:   Alcohol abuse   Current Medications:  Current Facility-Administered Medications  Medication Dose Route Frequency Provider Last Rate Last Admin   acetaminophen (TYLENOL) tablet 650 mg  650 mg Oral Q6H PRN Sindy Guadeloupe, NP   650 mg at 04/14/23 1555   alum & mag hydroxide-simeth (MAALOX/MYLANTA) 200-200-20 MG/5ML suspension 30 mL  30 mL Oral Q4H PRN Sindy Guadeloupe, NP   30 mL at 04/20/23 1512   ARIPiprazole (ABILIFY) tablet 5 mg  5 mg Oral Daily Rex Kras, MD   5 mg at 04/20/23 0752   ARIPiprazole ER (ABILIFY MAINTENA) 400 MG prefilled syringe 400 mg  400 mg Intramuscular Q30 days Rex Kras, MD   400 mg at 04/17/23 1011   busPIRone (BUSPAR) tablet 10 mg  10 mg Oral TID Rex Kras, MD   10 mg at 04/20/23 1812   guanFACINE (INTUNIV) ER tablet 2 mg  2 mg Oral QHS Rex Kras, MD   2 mg at 04/20/23 2109   hydrOXYzine (ATARAX) tablet 25 mg  25 mg Oral TID PRN Rex Kras, MD   25 mg at 04/20/23 1512   OLANZapine zydis (ZYPREXA) disintegrating tablet 5 mg  5 mg Oral Q8H PRN Sindy Guadeloupe, NP       And   LORazepam (ATIVAN) tablet 1 mg  1 mg Oral PRN Sindy Guadeloupe, NP       And   ziprasidone (GEODON) injection 20 mg  20 mg Intramuscular PRN Sindy Guadeloupe, NP       magnesium hydroxide (MILK OF MAGNESIA) suspension 30 mL  30 mL Oral Daily PRN Sindy Guadeloupe, NP       methocarbamol (ROBAXIN) tablet 500 mg  500 mg Oral Q6H PRN Massengill, Harrold Donath, MD   500 mg at 04/20/23 1052   mirtazapine (REMERON) tablet 30 mg  30 mg Oral QHS Rex Kras, MD   30 mg at 04/20/23 2108   multivitamin with minerals tablet 1 tablet  1 tablet Oral Daily Rex Kras, MD   1 tablet at 04/20/23 0347   nicotine (NICODERM CQ - dosed in mg/24 hours) patch 21 mg  21 mg Transdermal Daily  Rex Kras, MD   21 mg at 04/20/23 0753   ondansetron (ZOFRAN-ODT) disintegrating tablet 4 mg  4 mg Oral Q8H PRN Rex Kras, MD   4 mg at 04/20/23 1052   pantoprazole (PROTONIX) EC tablet 40 mg  40 mg Oral Daily Rex Kras, MD   40 mg at 04/20/23 4259   prazosin (MINIPRESS) capsule 1 mg  1 mg Oral QHS Rex Kras, MD   1 mg at 04/20/23 2108   traZODone (DESYREL) tablet 50 mg  50 mg Oral QHS PRN Rex Kras, MD       PTA Medications: Medications Prior to Admission  Medication Sig Dispense Refill Last Dose   ARIPiprazole ER (ABILIFY MAINTENA) 400 MG PRSY prefilled syringe Inject 400 mg into the muscle every 30 (thirty) days.      busPIRone (BUSPAR) 10 MG tablet Take 1 tablet (10 mg total) by mouth 3 (three) times daily. 90 tablet 0    guanFACINE (INTUNIV) 2 MG TB24 ER tablet Take 2 mg by mouth at bedtime.      hydrOXYzine (ATARAX) 25 MG tablet Take 1 tablet (25 mg total) by mouth 3 (three) times daily as needed for anxiety. 30 tablet  0    mirtazapine (REMERON) 30 MG tablet Take 1 tablet (30 mg total) by mouth at bedtime. 30 tablet 0    Multiple Vitamin (MULTIVITAMIN WITH MINERALS) TABS tablet Take 1 tablet by mouth daily.      nicotine (NICODERM CQ - DOSED IN MG/24 HOURS) 21 mg/24hr patch Place 1 patch (21 mg total) onto the skin daily. 28 patch 0    oxyCODONE (OXY IR/ROXICODONE) 5 MG immediate release tablet Take 5 mg by mouth every 4 (four) hours as needed.      pantoprazole (PROTONIX) 40 MG tablet Take 1 tablet (40 mg total) by mouth daily. 30 tablet 0    prazosin (MINIPRESS) 1 MG capsule Take 1 capsule (1 mg total) by mouth at bedtime. 30 capsule 0    traZODone (DESYREL) 50 MG tablet Take 1 tablet (50 mg total) by mouth at bedtime as needed for sleep. 30 tablet 0     Patient Stressors:    Patient Strengths:    Treatment Modalities: Medication Management, Group therapy, Case management,  1 to 1 session with clinician, Psychoeducation, Recreational  therapy.   Physician Treatment Plan for Primary Diagnosis: Alcohol abuse Long Term Goal(s):     Short Term Goals:    Medication Management: Evaluate patient's response, side effects, and tolerance of medication regimen.  Therapeutic Interventions: 1 to 1 sessions, Unit Group sessions and Medication administration.  Evaluation of Outcomes: Progressing  Physician Treatment Plan for Secondary Diagnosis: Principal Problem:   Alcohol abuse  Long Term Goal(s):     Short Term Goals:       Medication Management: Evaluate patient's response, side effects, and tolerance of medication regimen.  Therapeutic Interventions: 1 to 1 sessions, Unit Group sessions and Medication administration.  Evaluation of Outcomes: Progressing   RN Treatment Plan for Primary Diagnosis: Alcohol abuse Long Term Goal(s): Knowledge of disease and therapeutic regimen to maintain health will improve  Short Term Goals: Ability to remain free from injury will improve, Ability to verbalize frustration and anger appropriately will improve, Ability to demonstrate self-control, Ability to participate in decision making will improve, Ability to verbalize feelings will improve, Ability to disclose and discuss suicidal ideas, Ability to identify and develop effective coping behaviors will improve, and Compliance with prescribed medications will improve  Medication Management: RN will administer medications as ordered by provider, will assess and evaluate patient's response and provide education to patient for prescribed medication. RN will report any adverse and/or side effects to prescribing provider.  Therapeutic Interventions: 1 on 1 counseling sessions, Psychoeducation, Medication administration, Evaluate responses to treatment, Monitor vital signs and CBGs as ordered, Perform/monitor CIWA, COWS, AIMS and Fall Risk screenings as ordered, Perform wound care treatments as ordered.  Evaluation of Outcomes:  Progressing   LCSW Treatment Plan for Primary Diagnosis: Alcohol abuse Long Term Goal(s): Safe transition to appropriate next level of care at discharge, Engage patient in therapeutic group addressing interpersonal concerns.  Short Term Goals: Engage patient in aftercare planning with referrals and resources, Increase social support, Increase ability to appropriately verbalize feelings, Increase emotional regulation, Facilitate acceptance of mental health diagnosis and concerns, Facilitate patient progression through stages of change regarding substance use diagnoses and concerns, Identify triggers associated with mental health/substance abuse issues, and Increase skills for wellness and recovery  Therapeutic Interventions: Assess for all discharge needs, 1 to 1 time with Social worker, Explore available resources and support systems, Assess for adequacy in community support network, Educate family and significant other(s) on suicide prevention, Complete Psychosocial  Assessment, Interpersonal group therapy.  Evaluation of Outcomes: Progressing   Progress in Treatment: Attending groups: Yes. Participating in groups: Yes. Taking medication as prescribed: Yes. Toleration medication: Yes. Family/Significant other contact made: Yes, individual(s) contacted:  Javious Terrana ( mom ) [phone]  Patient understands diagnosis: Yes. Discussing patient identified problems/goals with staff: Yes. Medical problems stabilized or resolved: Yes. Denies suicidal/homicidal ideation: Yes. Issues/concerns per patient self-inventory: Yes. Other: N/A  New problem(s) identified: No, Describe:  None Reported  New Short Term/Long Term Goal(s): stabilization, elimination of SI thoughts, development of comprehensive mental wellness plan.  medication     Patient Goals:  Sobriety  Discharge Plan or Barriers: CSW will continue to follow and assess for appropriate referrals and possible discharge planning.    Reason for Continuation of Hospitalization: Depression Medication stabilization Suicidal ideation Withdrawal symptoms  Estimated Length of Stay: 5-7 Days  Last 3 [place] Suicide Severity Risk Score: Flowsheet Row [facility]13/2023 in [facility] Admission (Discharged[facility]  C-SSRS RISK CATEGORY Low Risk High Risk No Risk       Last PHQ 2/9 Scores:     No data to display         detox, medication management for mood stabilization; elimination of SI thoughts; development of comprehensive mental wellness/sobriety plan  Scribe for Treatment Team: Ane Payment, LCSW 04/21/2023 8:07 AM

## 2023-04-21 NOTE — Group Note (Signed)
Recreation Therapy Group Note   Group Topic:Other  Group Date: 04/21/2023 Start Time: 0920 End Time: 1012 Facilitators: Tirrell Buchberger-McCall, LRT,CTRS Location: 300 Hall Dayroom   Group Topic: Self-Expression  Goal Area(s) Addresses:  Patient will successfully identify positive attributes about themselves.  Patient will identify healthy ways to express feelings. Patient will acknowledge benefit(s) of improved self-expression.   Activity: Quarry manager. Patients were given card stock paper and individual watercolor sets. Patients were to use the supplies given to create pictures express what they are feeling or thinking.  Education: Self-Expression Discharge Planning  Education Outcome: Acknowledges education/In group clarification offered/Needs additional education   Affect/Mood: Flat   Participation Level: None   Participation Quality: Independent   Behavior: On-looking   Speech/Thought Process: None   Insight: None   Judgement: None   Modes of Intervention: Art   Patient Response to Interventions:  Attentive   Education Outcome:  In group clarification offered    Clinical Observations/Individualized Feedback: Pt didn't participate in activity. Pt observed as peers completed theirs.     Plan: Continue to engage patient in RT group sessions 2-3x/week.   Quintyn Dombek-McCall, LRT,CTRS 04/21/2023 1:47 PM

## 2023-04-21 NOTE — BHH Group Notes (Signed)
Spiritual care group facilitated by Chaplain Dyanne Carrel, [facility]  Group focused on topic of strength. Group members reflected on what thoughts and feelings emerge when they hear this topic. They then engaged in facilitated dialog around how strength is present in their lives. This dialog focused on representing what strength had been to them in their lives (images and patterns given) and what they saw as helpful in their life now (what they needed / wanted).  Activity drew on narrative framework.  Patient Progress: Cody Young attended group.  Though verbal participation was minimal, he demonstrated engagement in the conversation.

## 2023-04-22 DIAGNOSIS — F1010 Alcohol abuse, uncomplicated: Secondary | ICD-10-CM | POA: Diagnosis not present

## 2023-04-22 MED ORDER — MIRTAZAPINE 30 MG PO TABS: 30.0000 mg | ORAL_TABLET | Freq: Every day | ORAL | 0 refills | Status: AC

## 2023-04-22 MED ORDER — HYDROXYZINE HCL 25 MG PO TABS: 25.0000 mg | ORAL_TABLET | Freq: Three times a day (TID) | ORAL | 0 refills | Status: AC | PRN

## 2023-04-22 MED ORDER — GUANFACINE HCL ER 2 MG PO TB24: 2.0000 mg | ORAL_TABLET | Freq: Every day | ORAL | 0 refills | Status: AC

## 2023-04-22 MED ORDER — ABILIFY MAINTENA 400 MG IM PRSY: 400.0000 mg | PREFILLED_SYRINGE | INTRAMUSCULAR | 0 refills | Status: AC

## 2023-04-22 MED ORDER — ADULT MULTIVITAMIN W/MINERALS CH: 1.0000 | ORAL_TABLET | Freq: Every day | ORAL | 0 refills | Status: AC

## 2023-04-22 MED ORDER — NICOTINE 21 MG/24HR TD PT24: 21.0000 mg | MEDICATED_PATCH | Freq: Every day | TRANSDERMAL | 0 refills | Status: AC

## 2023-04-22 MED ORDER — ARIPIPRAZOLE 5 MG PO TABS: 5.0000 mg | ORAL_TABLET | Freq: Every day | ORAL | 0 refills | Status: AC

## 2023-04-22 MED ORDER — PRAZOSIN HCL 1 MG PO CAPS: 1.0000 mg | ORAL_CAPSULE | Freq: Every day | ORAL | 0 refills | Status: AC

## 2023-04-22 MED ORDER — BUSPIRONE HCL 10 MG PO TABS: 10.0000 mg | ORAL_TABLET | Freq: Three times a day (TID) | ORAL | 0 refills | Status: AC

## 2023-04-22 MED ORDER — ONDANSETRON 4 MG PO TBDP: 4.0000 mg | ORAL_TABLET | Freq: Three times a day (TID) | ORAL | 0 refills | Status: AC | PRN

## 2023-04-22 MED ORDER — PANTOPRAZOLE SODIUM 40 MG PO TBEC: 40.0000 mg | DELAYED_RELEASE_TABLET | Freq: Every day | ORAL | 0 refills | Status: AC

## 2023-04-22 MED ORDER — METHOCARBAMOL 500 MG PO TABS: 500.0000 mg | ORAL_TABLET | Freq: Four times a day (QID) | ORAL | 0 refills | Status: AC | PRN

## 2023-04-22 NOTE — Progress Notes (Signed)
   04/22/23 0900  Psych Admission Type (Psych Patients Only)  Admission Status Voluntary  Psychosocial Assessment  Patient Complaints Anxiety;Depression  Eye Contact Fair  Facial Expression Anxious  Affect Appropriate to circumstance  Speech Logical/coherent  Interaction Assertive  Motor Activity Slow  Appearance/Hygiene Unremarkable  Behavior Characteristics Cooperative;Calm  Mood Depressed;Anxious  Thought Process  Coherency WDL  Content WDL  Delusions None reported or observed  Perception WDL  Hallucination None reported or observed  Judgment Impaired  Confusion None  Danger to Self  Current suicidal ideation? Denies  Self-Injurious Behavior No self-injurious ideation or behavior indicators observed or expressed   Agreement Not to Harm Self Yes  Description of Agreement Verbal  Danger to Others  Danger to Others None reported or observed  Danger to Others Abnormal  Harmful Behavior to others No threats or harm toward other people  Destructive Behavior No threats or harm toward property

## 2023-04-22 NOTE — Progress Notes (Signed)
Patient discharged from [facility] on 04/22/23. Patient denies SI, plan, and intention. Suicide safety plan completed, reviewed with this RN, given to the patient, and a copy in the chart. Patient denies HI/AVH upon discharge.Patient is alert, oriented, and cooperative. RN provided patient with discharge paperwork and reviewed information with patient. Patient expressed that he understood all of the discharge instructions. Pt was satisfied with belongings returned to him from the locker and at bedside. Discharged patient to [facility] waiting room.

## 2023-04-22 NOTE — BHH Suicide Risk Assessment (Signed)
[facility] Discharge Suicide Risk Assessment   Principal Problem: Alcohol abuse Discharge Diagnoses: Principal Problem:   Alcohol abuse   Total Time spent with patient: 45 minutes  Reason for admission: 32 year old Caucasian male with a history of bipolar disorder and substance use disorder admitted with increasing depression and suicidal ideations with a plan to either slit his wrist or hang himself.   PTA Medications:  Medications Prior to Admission  Medication Sig Dispense Refill Last Dose   ARIPiprazole ER (ABILIFY MAINTENA) 400 MG PRSY prefilled syringe Inject 400 mg into the muscle every 30 (thirty) days.         busPIRone (BUSPAR) 10 MG tablet Take 1 tablet (10 mg total) by mouth 3 (three) times daily. 90 tablet 0     guanFACINE (INTUNIV) 2 MG TB24 ER tablet Take 2 mg by mouth at bedtime.         hydrOXYzine (ATARAX) 25 MG tablet Take 1 tablet (25 mg total) by mouth 3 (three) times daily as needed for anxiety. 30 tablet 0     mirtazapine (REMERON) 30 MG tablet Take 1 tablet (30 mg total) by mouth at bedtime. 30 tablet 0     Multiple Vitamin (MULTIVITAMIN WITH MINERALS) TABS tablet Take 1 tablet by mouth daily.         nicotine (NICODERM CQ - DOSED IN MG/24 HOURS) 21 mg/24hr patch Place 1 patch (21 mg total) onto the skin daily. 28 patch 0     oxyCODONE (OXY IR/ROXICODONE) 5 MG immediate release tablet Take 5 mg by mouth every 4 (four) hours as needed.         pantoprazole (PROTONIX) 40 MG tablet Take 1 tablet (40 mg total) by mouth daily. 30 tablet 0     prazosin (MINIPRESS) 1 MG capsule Take 1 capsule (1 mg total) by mouth at bedtime. 30 capsule 0     traZODone (DESYREL) 50 MG tablet Take 1 tablet (50 mg total) by mouth at bedtime as needed for sleep. 30 tablet 0      Hospital Course:   Patient reported admission was triggered by worsening depression and SI after discharge from this facility mainly triggered by homelessness.  During the patient's hospitalization, patient had  extensive initial psychiatric evaluation, and follow-up psychiatric evaluations every day.  Psychiatric diagnoses provided upon initial assessment: Bipolar disorder, current episode is depressed GAD Stimulant use disorder Alcohol use disorder Sedative-hypnotic use disorder Opioid use disorder Tobacco use disorder  Patient's psychiatric medications were adjusted on admission: -Abilify Maintena 400 mg IM every 30 days.  Verify last dose given. -BuSpar 10 mg p.o. 3 times daily -Guanfacine 2 mg TD 24 ER tablet at bedtime -Minipress 1 mg at bedtime -Trazodone 50 mg at bedtime as needed -Remeron 30 mg at bedtime  During the hospitalization, other adjustments were made to the patient's psychiatric medication regimen: Abilify orally was added at a dose of 5 mg daily to address mood stabilization with recommendation to discontinue after a month.  Otherwise medications were continued as noted above with no changes.  Abilify Maintena injection was given during this hospital stay on 10/26 with next injection due on 11/26  Patient's care was discussed during the interdisciplinary team meeting every day during the hospitalization.  During this hospital stay patient was referred to treatment centers including [facility] when he got accepted to a facility in [place] area, he reported feeling excited and hopeful about this discharge plan, mother was supportive emotionally and financially to his treatment plan and was able  to pay the fees for the facility to facilitate treatment.  The patient denied having side effects to prescribed psychiatric medication.  Gradually, patient started adjusting to milieu. The patient was evaluated each day by a clinical provider to ascertain response to treatment. Improvement was noted by the patient's report of decreasing symptoms, improved sleep and appetite, affect, medication tolerance, behavior, and participation in unit programming.  Patient was asked each day to complete a  self inventory noting mood, mental status, pain, new symptoms, anxiety and concerns.    Symptoms were reported as significantly decreased or resolved completely by discharge.   On day of discharge, patient was evaluated on 10/31 the patient reports that their mood is stable. The patient denied having suicidal thoughts for more than 48 hours prior to discharge.  Patient denies having homicidal thoughts.  Patient denies having auditory hallucinations.  Patient denies any visual hallucinations or other symptoms of psychosis. The patient was motivated to continue taking medication with a goal of continued improvement in mental health.  Patient was able to identify coping skills with stressors as well as crisis plan if worsening depression or recurring SI after discharge.  The patient reports their target psychiatric symptoms of worsening depression and SI responded well to the psychiatric medications, and the patient reports overall benefit other psychiatric hospitalization. Supportive psychotherapy was provided to the patient. The patient also participated in regular group therapy while hospitalized. Coping skills, problem solving as well as relaxation therapies were also part of the unit programming.  Labs were reviewed with the patient, and abnormal results were discussed with the patient.  The patient is able to verbalize their individual safety plan to this provider.  Behavioral Events: None  Restraints: None  Groups: Attended and participated  Medications Changes: As above   Sleep  Good sleep reported, improved during hospital stay  Musculoskeletal: Strength & Muscle Tone: within normal limits Gait & Station: normal Patient leans: N/A  Psychiatric Specialty Exam  General Appearance: appears at stated age, fairly dressed and groomed  Behavior: pleasant and cooperative  Psychomotor Activity:No psychomotor agitation or retardation noted   Eye Contact: good Speech: normal amount,  tone, volume and latency   Mood: euthymic Affect: congruent, pleasant and interactive  Thought Process: linear, goal directed, no circumstantial or tangential thought process noted, no racing thoughts or flight of ideas Descriptions of Associations: intact Thought Content: Hallucinations: denies AH, VH , does not appear responding to stimuli Delusions: No paranoia or other delusions noted Suicidal Thoughts: denies SI, intention, plan  Homicidal Thoughts: denies HI, intention, plan   Alertness/Orientation: alert and fully oriented  Insight: fair, improved Judgment: fair, improved  Memory: intact  Executive Functions  Concentration: intact  Attention Span: Fair Recall: intact Fund of Knowledge: fair   Art therapist  Concentration: intact Attention Span: Fair Recall: intact Fund of Knowledge: fair   Assets  Assets: Manufacturing systems engineer; Desire for Improvement   Physical Exam: Physical Exam ROS Blood pressure 115/78, pulse 62, temperature 98.1 F (36.7 C), temperature source Oral, resp. rate 18, height 6\' 2"  (1.88 m), weight 123.6 kg, SpO2 100%. Body mass index is 34.97 kg/m.  Mental Status Per Nursing Assessment::   On Admission:  Self-harm thoughts, Self-harm behaviors  Demographic Factors:  Male, Caucasian, and Unemployed  Loss Factors: NA  Historical Factors: Impulsivity  Risk Reduction Factors:   Positive social support  Continued Clinical Symptoms: Symptoms improved significantly during hospital stay Depression:   Anhedonia Hopelessness Insomnia Alcohol/Substance Abuse/Dependencies patient denied craving to illicit  drugs or alcohol at time of discharge  Cognitive Features That Contribute To Risk:  None    Suicide Risk:  Minimal: No identifiable suicidal ideation.  Patients presenting with no risk factors but with morbid ruminations; may be classified as minimal risk based on the severity of the depressive symptoms   Follow-up  Information     [address] [facility] [place] [employer]. Follow up.   Contact information: [address] [place] [address] [phone]                 Plan Of Care/Follow-up recommendations:   Discharge recommendations:     Activity: as tolerated  Diet: heart healthy  # It is recommended to the patient to continue psychiatric medications as prescribed, after discharge from the hospital.     # It is recommended to the patient to follow up with your outpatient psychiatric provider and PCP.   # It was discussed with the patient, the impact of alcohol, drugs, tobacco have been there overall psychiatric and medical wellbeing, and total abstinence from substance use was recommended the patient.ed.   # Prescriptions provided or sent directly to preferred pharmacy at discharge. Patient agreeable to plan. Given opportunity to ask questions. Appears to feel comfortable with discharge.    # In the event of worsening symptoms, the patient is instructed to call the crisis hotline, 911 and or go to the nearest ED for appropriate evaluation and treatment of symptoms. To follow-up with primary care provider for other medical issues, concerns and or health care needs   # Patient was discharged home with a plan to follow up as noted above.  -Follow-up with outpatient primary care doctor and other specialists -for management of chronic medical disease, including: Patient was recommended to follow-up with primary care provider regarding GERD, muscle spasms as well as chronic nausea, he agreed.   Patient agrees with D/C instructions and plan.  The patient received suicide prevention pamphlet:  Yes Belongings returned:  Clothing and Valuables  Total Time Spent in Direct Patient Care:  I personally spent 45 minutes on the unit in direct patient care. The direct patient care time included face-to-face time with the patient, reviewing the patient's chart, communicating with  other professionals, and coordinating care. Greater than 50% of this time was spent in counseling or coordinating care with the patient regarding goals of hospitalization, psycho-education, and discharge planning needs.   Lelon Ikard 04/22/2023, 9:50 AM   Lenzy Kerschner Abbott Pao, MD 04/22/2023, 9:50 AM

## 2023-04-22 NOTE — Discharge Summary (Signed)
Physician Discharge Summary Note  Patient:  Cody Young is an 32 y.o., male MRN:  [id number] DOB:  1990/09/29 Patient phone:  [phone] (home)  Patient address:   [address] [place] [address],  Total Time spent with patient: 45 minutes  Date of Admission:  04/14/2023 Date of Discharge: 04/22/2023  Reason for Admission:  32 year old Caucasian male with a history of bipolar disorder and substance use disorder admitted with increasing depression and suicidal ideations with a plan to either slit his wrist or hang himself.  Patient confirmed worsening symptoms and SI were mainly triggered by "homelessness"  Principal Problem: Alcohol abuse Discharge Diagnoses: Principal Problem:   Alcohol abuse Active Problems:   Bipolar 1 disorder [facility])   Past Psychiatric History:  The patient was admitted to [facility] behavioral health from 10/14 to 04/15/2023. He returned back for the current admission within 24 hours. He has multiple prior admissions with a diagnosis of bipolar disorder. He reports using multiple medications including Abilify, Remeron, Cymbalta, prazosin, BuSpar. He gives a history of at least 2 suicide attempts in the past.   Past Medical History:  Past Medical History:  Diagnosis Date   Bipolar 1 disorder (HCC)    Hypertension    Medical history non-contributory    Mood and affect disturbance     Past Surgical History:  Procedure Laterality Date   BRAIN SURGERY      Family History: History reviewed. No pertinent family history. Family Psychiatric  History:  According to the records mother has a history of bipolar disorder and maternal grandmother has schizophrenia.  Social History:  Social History   Substance and Sexual Activity  Alcohol Use Yes   Alcohol/week: 12.0 standard drinks of alcohol   Types: 12 Cans of beer per week   Comment: Currently drinks mad dogs     Social History   Substance and Sexual Activity  Drug Use Yes   Types: Marijuana,  Cocaine, "Crack" cocaine   Comment: deneis current cocaine use, none since last admit    Social History   Socioeconomic History   Marital status: Single    Spouse name: Not on file   Number of children: Not on file   Years of education: Not on file   Highest education level: Not on file  Occupational History   Not on file  Tobacco Use   Smoking status: Every Day    Current packs/day: 1.00    Average packs/day: 1 pack/day for 20.0 years (20.0 ttl pk-yrs)    Types: Cigarettes   Smokeless tobacco: Never  Vaping Use   Vaping status: Never Used  Substance and Sexual Activity   Alcohol use: Yes    Alcohol/week: 12.0 standard drinks of alcohol    Types: 12 Cans of beer per week    Comment: Currently drinks mad dogs   Drug use: Yes    Types: Marijuana, Cocaine, "Crack" cocaine    Comment: deneis current cocaine use, none since last admit   Sexual activity: Yes    Birth control/protection: Condom  Other Topics Concern   Not on file  Social History Narrative   Not on file   Social Determinants of Health   Financial Resource Strain: Not on file  Food Insecurity: No Food Insecurity (04/14/2023)   Hunger Vital Sign    Worried About Running Out of Food in the Last Year: Never true    Ran Out of Food in the Last Year: Never true  Transportation Needs: No Transportation Needs (04/14/2023)  PRAPARE - Administrator, Civil Service (Medical): No    Lack of Transportation (Non-Medical): No  Physical Activity: Not on file  Stress: Not on file  Social Connections: Not on file   Homeless, mother lives in the area and is supportive, single, denies legal problems or pending charges, denies access to weapons.  Substance Use History:  Drinking alcohol on daily basis with history of withdrawals and rehab treatment reported.  No history of withdrawal seizures reported.  Reports on and off marijuana use chronically, last use prior to admission.  Hospital Course:   Patient  reported admission was triggered by worsening depression and SI after discharge from this facility mainly triggered by homelessness.   During the patient's hospitalization, patient had extensive initial psychiatric evaluation, and follow-up psychiatric evaluations every day.   Psychiatric diagnoses provided upon initial assessment: Bipolar disorder, current episode is depressed GAD Stimulant use disorder Alcohol use disorder Sedative-hypnotic use disorder Opioid use disorder Tobacco use disorder   Patient's psychiatric medications were adjusted on admission: -Abilify Maintena 400 mg IM every 30 days.  Verify last dose given. -BuSpar 10 mg p.o. 3 times daily -Guanfacine 2 mg TD 24 ER tablet at bedtime -Minipress 1 mg at bedtime -Trazodone 50 mg at bedtime as needed -Remeron 30 mg at bedtime   During the hospitalization, other adjustments were made to the patient's psychiatric medication regimen: Abilify orally was added at a dose of 5 mg daily to address mood stabilization with recommendation to discontinue after a month.  Otherwise medications were continued as noted above with no changes.  Abilify Maintena injection was given during this hospital stay on 10/26 with next injection due on 11/26   Patient's care was discussed during the interdisciplinary team meeting every day during the hospitalization.   During this hospital stay patient was referred to treatment centers including [facility] when he got accepted to a facility in [place] area, he reported feeling excited and hopeful about this discharge plan, mother was supportive emotionally and financially to his treatment plan and was able to pay the fees for the facility to facilitate treatment.   The patient denied having side effects to prescribed psychiatric medication.   Gradually, patient started adjusting to milieu. The patient was evaluated each day by a clinical provider to ascertain response to treatment. Improvement was noted by  the patient's report of decreasing symptoms, improved sleep and appetite, affect, medication tolerance, behavior, and participation in unit programming.  Patient was asked each day to complete a self inventory noting mood, mental status, pain, new symptoms, anxiety and concerns.     Symptoms were reported as significantly decreased or resolved completely by discharge.    On day of discharge, patient was evaluated on 10/31 the patient reports that their mood is stable. The patient denied having suicidal thoughts for more than 48 hours prior to discharge.  Patient denies having homicidal thoughts.  Patient denies having auditory hallucinations.  Patient denies any visual hallucinations or other symptoms of psychosis. The patient was motivated to continue taking medication with a goal of continued improvement in mental health.  Patient was able to identify coping skills with stressors as well as crisis plan if worsening depression or recurring SI after discharge.   The patient reports their target psychiatric symptoms of worsening depression and SI responded well to the psychiatric medications, and the patient reports overall benefit other psychiatric hospitalization. Supportive psychotherapy was provided to the patient. The patient also participated in regular group therapy while  hospitalized. Coping skills, problem solving as well as relaxation therapies were also part of the unit programming.   Labs were reviewed with the patient, and abnormal results were discussed with the patient.   The patient is able to verbalize their individual safety plan to this provider.   Behavioral Events: None   Restraints: None   Groups: Attended and participated   Medications Changes: As above     Sleep  Good sleep reported, improved during hospital stay    Physical Findings: AIMS: Facial and Oral Movements Muscles of Facial Expression: None Lips and Perioral Area: None Jaw: None Tongue: None,Extremity  Movements Upper (arms, wrists, hands, fingers): None Lower (legs, knees, ankles, toes): None, Trunk Movements Neck, shoulders, hips: None, Global Judgements Severity of abnormal movements overall : None Incapacitation due to abnormal movements: None Patient's awareness of abnormal movements: No Awareness, Dental Status Current problems with teeth and/or dentures?: No Does patient usually wear dentures?: No Edentia?: No  CIWA:    COWS:     Musculoskeletal: Strength & Muscle Tone: within normal limits Gait & Station: normal Patient leans: N/A   Psychiatric Specialty Exam:  General Appearance: appears at stated age, fairly dressed and groomed  Behavior: pleasant and cooperative  Psychomotor Activity:No psychomotor agitation or retardation noted   Eye Contact: good Speech: normal amount, tone, volume and latency   Mood: euthymic Affect: congruent, pleasant and interactive  Thought Process: linear, goal directed, no circumstantial or tangential thought process noted, no racing thoughts or flight of ideas Descriptions of Associations: intact Thought Content: Hallucinations: denies AH, VH , does not appear responding to stimuli Delusions: No paranoia or other delusions noted Suicidal Thoughts: denies SI, intention, plan  Homicidal Thoughts: denies HI, intention, plan   Alertness/Orientation: alert and fully oriented  Insight: fair, improved Judgment: fair, improved  Memory: intact  Executive Functions  Concentration: intact  Attention Span: Fair Recall: intact Fund of Knowledge: fair   Assets  Assets: Manufacturing systems engineer; Desire for Improvement     Physical Exam:  Physical Exam Vitals and nursing note reviewed.  Constitutional:      Appearance: Normal appearance.  HENT:     Head: Normocephalic and atraumatic.  Eyes:     Extraocular Movements: Extraocular movements intact.  Pulmonary:     Effort: Pulmonary effort is normal.  Musculoskeletal:         General: Normal range of motion.     Cervical back: Normal range of motion.  Neurological:     General: No focal deficit present.     Mental Status: He is alert and oriented to person, place, and time. Mental status is at baseline.  Psychiatric:        Mood and Affect: Mood normal.        Behavior: Behavior normal.        Thought Content: Thought content normal.        Judgment: Judgment normal.    Review of Systems  All other systems reviewed and are negative.  Blood pressure 115/78, pulse 62, temperature 98.1 F (36.7 C), temperature source Oral, resp. rate 18, height 6\' 2"  (1.88 m), weight 123.6 kg, SpO2 100%. Body mass index is 34.97 kg/m.   Social History   Tobacco Use  Smoking Status Every Day   Current packs/day: 1.00   Average packs/day: 1 pack/day for 20.0 years (20.0 ttl pk-yrs)   Types: Cigarettes  Smokeless Tobacco Never   Tobacco Cessation:  A prescription for an FDA-approved tobacco cessation medication provided at discharge  Blood Alcohol level:  Lab Results  Component Value Date   ETH <10 04/13/2023   ETH <10 04/04/2023    Metabolic Disorder Labs:  Lab Results  Component Value Date   HGBA1C 5.5 04/06/2023   MPG 111.15 04/06/2023   MPG 103 09/02/2022   No results found for: "PROLACTIN" Lab Results  Component Value Date   CHOL 166 04/06/2023   TRIG 96 04/06/2023   HDL 34 (L) 04/06/2023   CHOLHDL 4.9 04/06/2023   VLDL 19 04/06/2023   LDLCALC 113 (H) 04/06/2023   LDLCALC 132 (H) 09/02/2022    See Psychiatric Specialty Exam and Suicide Risk Assessment completed by Attending Physician prior to discharge.  Discharge destination:  Other:  [facility] [place] [facility] use [facility]hab treatment  Is patient on multiple antipsychotic therapies at discharge:  No   Has Patient had three or more failed trials of antipsychotic monotherapy by history:  No  Recommended Plan for Multiple Antipsychotic Therapies: NA Patient was discharged on oral  Abilify in addition to Abilify Maintena injection with recommendation to stop oral Abilify after 30 days while under follow-up and management by outpatient psychiatrist. Discharge Instructions     Diet - low sodium heart healthy   Complete by: As directed    Increase activity slowly   Complete by: As directed       Allergies as of 04/22/2023   No Known Allergies      Medication List     STOP taking these medications    oxyCODONE 5 MG immediate release tablet Commonly known as: Oxy IR/ROXICODONE   traZODone 50 MG tablet Commonly known as: DESYREL       TAKE these medications      Indication  ARIPiprazole 5 MG tablet Commonly known as: ABILIFY Take 1 tablet (5 mg total) by mouth daily. Start taking on: April 23, 2023 What changed: You were already taking a medication with the same name, and this prescription was added. Make sure you understand how and when to take each.  Indication: Major Depressive Disorder   Abilify Maintena 400 MG Prsy prefilled syringe Generic drug: ARIPiprazole ER Inject 400 mg into the muscle every 30 (thirty) days. Next injection due on 05/18/23 Start taking on: May 17, 2023 What changed: additional instructions  Indication: Manic-Depression, to be continued by patient's ACTT for mood stabilization   busPIRone 10 MG tablet Commonly known as: BUSPAR Take 1 tablet (10 mg total) by mouth 3 (three) times daily.  Indication: Anxiety Disorder, Major Depressive Disorder   guanFACINE 2 MG Tb24 ER tablet Commonly known as: INTUNIV Take 1 tablet (2 mg total) by mouth at bedtime.  Indication: irritability and insomnia   hydrOXYzine 25 MG tablet Commonly known as: ATARAX Take 1 tablet (25 mg total) by mouth 3 (three) times daily as needed for anxiety.  Indication: Feeling Anxious   methocarbamol 500 MG tablet Commonly known as: ROBAXIN Take 1 tablet (500 mg total) by mouth every 6 (six) hours as needed for muscle spasms.  Indication:  Musculoskeletal Pain   mirtazapine 30 MG tablet Commonly known as: REMERON Take 1 tablet (30 mg total) by mouth at bedtime.  Indication: Major Depressive Disorder   multivitamin with minerals Tabs tablet Take 1 tablet by mouth daily.  Indication: Nutritional Support   nicotine 21 mg/24hr patch Commonly known as: NICODERM CQ - dosed in mg/24 hours Place 1 patch (21 mg total) onto the skin daily.  Indication: Nicotine Addiction   ondansetron 4 MG disintegrating tablet  Commonly known as: ZOFRAN-ODT Take 1 tablet (4 mg total) by mouth every 8 (eight) hours as needed for nausea or vomiting.  Indication: Nausea and Vomiting   pantoprazole 40 MG tablet Commonly known as: PROTONIX Take 1 tablet (40 mg total) by mouth daily.  Indication: Gastroesophageal Reflux Disease   prazosin 1 MG capsule Commonly known as: MINIPRESS Take 1 capsule (1 mg total) by mouth at bedtime.  Indication: Disturbed Sleep        Follow-up Information     [address] [facility] & [place], [employer]. Follow up.   Contact information: [address] [place] [address] [phone]                 Discharge recommendations:    Activity: as tolerated  Diet: heart healthy  # It is recommended to the patient to continue psychiatric medications as prescribed, after discharge from the hospital.     # It is recommended to the patient to follow up with your outpatient psychiatric provider and PCP.   # It was discussed with the patient, the impact of alcohol, drugs, tobacco have been there overall psychiatric and medical wellbeing, and total abstinence from substance use was recommended the patient.ed.   # Prescriptions provided or sent directly to preferred pharmacy at discharge. Patient agreeable to plan. Given opportunity to ask questions. Appears to feel comfortable with discharge.    # In the event of worsening symptoms, the patient is instructed to call the crisis hotline, 911  and or go to the nearest ED for appropriate evaluation and treatment of symptoms. To follow-up with primary care provider for other medical issues, concerns and or health care needs   # Patient was discharged home with a plan to follow up as noted above.  -Follow-up with outpatient primary care doctor and other specialists -for management of chronic medical disease, including: Patient was recommended to follow-up with primary care provider regarding GERD, muscle spasms as well as chronic nausea, he agreed.    Patient agrees with D/C instructions and plan.   The patient received suicide prevention pamphlet:  Yes Belongings returned:  Clothing and Valuables  Total Time Spent in Direct Patient Care:  I personally spent 45 minutes on the unit in direct patient care. The direct patient care time included face-to-face time with the patient, reviewing the patient's chart, communicating with other professionals, and coordinating care. Greater than 50% of this time was spent in counseling or coordinating care with the patient regarding goals of hospitalization, psycho-education, and discharge planning needs.    SignedSarita Bottom, MD 04/22/2023, 10:03 AM

## 2023-04-22 NOTE — Plan of Care (Signed)
  Problem: Education: Goal: Knowledge of [facility] Education information/materials will improve Outcome: Progressing Goal: Emotional status will improve Outcome: Progressing Goal: Mental status will improve Outcome: Progressing Goal: Verbalization of understanding the information provided will improve Outcome: Progressing   

## 2023-04-22 NOTE — Plan of Care (Signed)
  Problem: Education: Goal: Emotional status will improve Outcome: Progressing Goal: Mental status will improve Outcome: Progressing   

## 2023-04-22 NOTE — Progress Notes (Signed)
  [facility] Case Management Discharge Plan :  Will you be returning to the same living situation after discharge:  No. Patient will be going to [facility] in [place] At discharge, do you have transportation home?: Yes,  CSW is arranging a Taxi for patient at 11:25 AM  Do you have the ability to pay for your medications: Yes,  Patient has [facility] Tailored Plan   Release of information consent forms completed and in the chart;  Patient's signature needed at discharge.  Patient to Follow up at:  Follow-up Information     [address] [facility] & [place], [employer].. Call on 04/23/2023.   Why: Please give Liborio Nixon the team led a call regarding your location since you will be in [facility] in [place], so they can continue your ACT team services for medication management and therapy. Contact information: [address] [place] [address] [place] [address] [phone]                 Next level of care provider has access to [facility] Link:no  Safety Planning and Suicide Prevention discussed: Valentino Hue  Aisea Stoltzfoos [phone]     Has patient been referred to the Quitline?: Patient refused referral for treatment  Patient has been referred for addiction treatment: Yes, the patient will follow up with an outpatient provider for substance use disorder. Psychiatrist/APP: patient to schedule appointment and Therapist: patient to schedule appointment. Patient was advised to call ACT team and ask for [facility] . Patient also arranged his subtance treatment with [facility] [place] in [place]  [place], [place] 04/22/2023, 11:00 AM

## 2023-04-22 NOTE — Transportation (Signed)
04/22/2023  Cody Young. DOB: [birth year] MRN: [id number]   RIDER WAIVER AND RELEASE OF LIABILITY  For the purposes of helping with transportation needs, [facility] partners with outside transportation providers (taxi companies, [place], Catering manager.) to give [employer] patients or other approved people the choice of on-demand rides [employer]") to our buildings for non-emergency visits.  By using [employer], I, the person signing this document, on behalf of myself and/or any legal minors (in my care using the [employer]), agree:  Science writer given to me are supplied by independent, outside transportation providers who do not work for, or have any affiliation with, [employer]. [place] is not a transportation company. [facility] has no control over the quality or safety of the rides I get using [employer]. [facility] has no control over whether any outside ride will happen on time or not. [facility] gives no guarantee on the reliability, quality, safety, or availability on any rides, or that no mistakes will happen. I know and accept that traveling by vehicle (car, truck, SVU, Zenaida Niece, bus, taxi, etc.) has risks of serious injuries such as disability, being paralyzed, and death. I know and agree the risk of using [employer] is mine alone, and not [employer]. Transport Services are provided "as is" and as are available. The transportation providers are in charge for all inspections and care of the vehicles used to provide these rides. I agree not to take legal action against [employer], its agents, employees, officers, directors, representatives, insurers, attorneys, assigns, successors, subsidiaries, and affiliates at any time for any reasons related directly or indirectly to using [employer]. I also agree not to take legal action against [facility] or its affiliates for any injury, death, or damage to property caused by or related to  using [employer]. I have read this Waiver and Release of Liability, and I understand the terms used in it and their legal meaning. This Waiver is freely and voluntarily given with the understanding that my right (or any legal minors) to legal action against  relating to [employer] is knowingly given up to use these services.   I attest that I read the Ride Waiver and Release of Liability to Cody Young., gave Mr. Goodine the opportunity to ask questions and answered the questions asked (if any). I affirm that Cody Young. then provided consent for assistance with transportation.

## 2023-04-22 NOTE — Progress Notes (Signed)
[facility] LCSW Progress Note   04/22/2023    10:50 AM  Colin Broach.      Type of Note: Talk with Patient   CSW was with patient while he was on the phone with Molly Maduro at [facility] who provided the address to where the Taxi will need to take patient before noon. Patient did share with Molly Maduro that he had the money order to pay for his stay. Patient got off the phone and was smiling saying, " I am so happy that I have somewhere to go and everything is working out. CSW did shared with patient that ACT team was contact , spoke with Wandra Mannan and she stated for patient to give Liborio Nixon a call and they will make sure they follow up with him after DC. Patient understood and stated that he will call them. CSW will continue to assist.     Signed:   Jacob Moores, MSW, LCSWA 04/22/2023 10:50 AM

## 2024-03-21 ENCOUNTER — Emergency Department: Payer: MEDICAID

## 2024-03-21 ENCOUNTER — Emergency Department
Admission: EM | Admit: 2024-03-21 | Discharge: 2024-03-21 | Disposition: A | Discharge status: Home / Self Care | Payer: MEDICAID | Attending: Emergency Medicine | Admitting: Emergency Medicine

## 2024-03-21 ENCOUNTER — Other Ambulatory Visit: Payer: Self-pay

## 2024-03-21 DIAGNOSIS — S0011XA Contusion of right eyelid and periocular area, initial encounter: Secondary | ICD-10-CM | POA: Diagnosis not present

## 2024-03-21 DIAGNOSIS — S0990XA Unspecified injury of head, initial encounter: Secondary | ICD-10-CM

## 2024-03-21 DIAGNOSIS — W1809XA Striking against other object with subsequent fall, initial encounter: Secondary | ICD-10-CM | POA: Insufficient documentation

## 2024-03-21 DIAGNOSIS — R0781 Pleurodynia: Secondary | ICD-10-CM | POA: Diagnosis not present

## 2024-03-21 DIAGNOSIS — R0789 Other chest pain: Secondary | ICD-10-CM

## 2024-03-21 MED ORDER — HYDROCODONE-ACETAMINOPHEN 5-325 MG PO TABS
1.0000 | ORAL_TABLET | Freq: Once | ORAL | Status: AC
Start: 2024-03-21 — End: 2024-03-21
  Administered 2024-03-21: 1 via ORAL
  Filled 2024-03-21: qty 1

## 2024-03-21 NOTE — Discharge Instructions (Signed)
 You were seen in the emergency department today after a head injury. Fortunately, your exam and CT scan were overall reassuring. It is still possible that you have a concussion. I have included more information about this in your paperwork. Please follow-up with your primary care doctor within a few days for reevaluation. Return to the ER for any worsening symptoms including worsening headache, confusion, or any other new or concerning symptoms.

## 2024-03-21 NOTE — ED Provider Notes (Signed)
 [facility] Provider Note    Event Date/Time   First MD Initiated Contact with Patient 03/21/24 1634     (approximate)   History   Fall   HPI  Cody Young. is a 33 year old male presenting to the emergency department for evaluation after fall.  Patient reports he was walking yesterday after taking his Seroquel which makes him drowsy when he fell and hit his head.  Does not think he had LOC.  Does have some bruising to his right eye.  Reports pain along his left arm, chest.  Reports nausea without vomiting.     Physical Exam   Triage Vital Signs: ED Triage Vitals  Encounter Vitals Group     BP 03/21/24 1605 115/88     Girls Systolic BP Percentile --      Girls Diastolic BP Percentile --      Boys Systolic BP Percentile --      Boys Diastolic BP Percentile --      Pulse Rate 03/21/24 1605 95     Resp 03/21/24 1605 16     Temp 03/21/24 1605 97.8 F (36.6 C)     Temp Source 03/21/24 1605 Oral     SpO2 03/21/24 1605 96 %     Weight 03/21/24 1604 272 lb 7.8 oz (123.6 kg)     Height --      Head Circumference --      Peak Flow --      Pain Score 03/21/24 1604 9     Pain Loc --      Pain Education --      Exclude from Growth Chart --     Most recent vital signs: Vitals:   03/21/24 1605  BP: 115/88  Pulse: 95  Resp: 16  Temp: 97.8 F (36.6 C)  SpO2: 96%    Nursing notes and vital signs reviewed.  General: Adult male, in recliner, awake and interactive Head: Ecchymosis along the right periorbital region, no proptosis, normal extraocular movements, dried blood in the naris, no septal hematoma Chest: Symmetric chest rise, tenderness over the left chest wall Cardiac: Regular rhythm and rate.  Respiratory: Lungs clear to auscultation Abdomen: Soft, nondistended. No tenderness to palpation.  MSK: No deformity to bilateral upper and lower extremity. Full range of motion to bilateral upper lower extremity. Neuro: Alert, oriented. Skin:  No evidence of burns or lacerations.   ED Results / Procedures / Treatments   Labs (all labs ordered are listed, but only abnormal results are displayed) Labs Reviewed - No data to display   EKG EKG independently reviewed and interpreted by myself demonstrates:    RADIOLOGY Imaging independently reviewed and interpreted by myself demonstrates:  CT head without acute bleed, soft tissue swelling noted, sinus disease noted but patient without acute complaints in this area CT cervical spine without acute fracture Rib series without acute rib fracture, radiology notes possible area of cortical irregularity along the left glenoid, but patient without point tenderness on this area on reevaluation  Formal Radiology Read:  CT Cervical Spine Wo Contrast Result Date: 03/21/2024 CLINICAL DATA:  Status post fall. EXAM: CT CERVICAL SPINE WITHOUT CONTRAST TECHNIQUE: Multidetector CT imaging of the cervical spine was performed without intravenous contrast. Multiplanar CT image reconstructions were also generated. RADIATION DOSE REDUCTION: This exam was performed according to the departmental dose-optimization program which includes automated exposure control, adjustment of the mA and/or kV according to patient size and/or use of iterative reconstruction technique. COMPARISON:  None Available. FINDINGS: Alignment: There is reversal of the normal cervical spine lordosis. Skull base and vertebrae: No acute fracture. No primary bone lesion or focal pathologic process. Soft tissues and spinal canal: No prevertebral fluid or swelling. No visible canal hematoma. Disc levels: Normal multilevel endplates are seen with normal multilevel intervertebral disc spaces. Normal bilateral multilevel facet joints are noted. Upper chest: Negative. Other: None. IMPRESSION: No acute fracture or subluxation in the cervical spine. Electronically Signed   By: Suzen Dials M.D.   On: 03/21/2024 18:27   CT Head Wo  Contrast Result Date: 03/21/2024 CLINICAL DATA:  Status post fall. EXAM: CT HEAD WITHOUT CONTRAST TECHNIQUE: Contiguous axial images were obtained from the base of the skull through the vertex without intravenous contrast. RADIATION DOSE REDUCTION: This exam was performed according to the departmental dose-optimization program which includes automated exposure control, adjustment of the mA and/or kV according to patient size and/or use of iterative reconstruction technique. COMPARISON:  None Available. FINDINGS: Brain: No evidence of acute infarction, hemorrhage, hydrocephalus, extra-axial collection or mass lesion/mass effect. Vascular: No hyperdense vessel or unexpected calcification. Skull: Normal. Negative for fracture or focal lesion. Sinuses/Orbits: Moderate severity frontal sinus mucosal thickening is seen. Other: There is mild right periorbital and right frontal scalp soft tissue swelling. IMPRESSION: 1. No acute intracranial abnormality. 2. Mild right periorbital and right frontal scalp soft tissue swelling. 3. Moderate severity frontal sinus disease. Electronically Signed   By: Suzen Dials M.D.   On: 03/21/2024 18:14   DG Ribs Unilateral W/Chest Left Result Date: 03/21/2024 CLINICAL DATA:  Status post fall. EXAM: LEFT RIBS AND CHEST - 3+ VIEW COMPARISON:  None Available. FINDINGS: No fracture or other bone lesions are seen involving the ribs. A small, ill-defined area of cortical irregularity is seen suspected along the inferior aspect of the left glenoid. This is of indeterminate age. There is no evidence of pneumothorax or pleural effusion. Both lungs are clear. Heart size and mediastinal contours are within normal limits. IMPRESSION: 1. No acute rib fracture. 2. Small, ill-defined area of cortical irregularity along the inferior aspect of the left glenoid which may represent a small fracture of indeterminate age. Correlation with physical examination is recommended to determine the presence of  point tenderness. Electronically Signed   By: Suzen Dials M.D.   On: 03/21/2024 18:10    PROCEDURES:  Critical Care performed: No  Procedures   MEDICATIONS ORDERED IN ED: Medications  HYDROcodone-acetaminophen  (NORCO/VICODIN) 5-325 MG per tablet 1 tablet (1 tablet Oral Given 03/21/24 1735)     IMPRESSION / MDM / ASSESSMENT AND PLAN / ED COURSE  I reviewed the triage vital signs and the nursing notes.  Differential diagnosis includes, but is not limited to, intracranial bleed, skull fracture, fracture, rib fracture, pneumothorax  Patient's presentation is most consistent with acute presentation with potential threat to life or bodily function.  33 year old male presenting to the emergency department for evaluation after a fall.  Stable vitals on presentation.  Patient does report that he was drowsy at the time of his fall, now with nausea with external signs of injury.  In the setting of this, do think imaging is reasonable.  CT head and C-spine ordered as well as x-Rakel Junio of the chest given his chest wall tenderness.  These fortunately were overall reassuring.  Patient reassessed and does report improved symptoms.  He is comfortable discharge home.  Strict return precautions provided.     FINAL CLINICAL IMPRESSION(S) / ED DIAGNOSES   Final diagnoses:  Closed head injury, initial encounter  Rib pain on left side     Rx / DC Orders   ED Discharge Orders     None        Note:  This document was prepared using Dragon voice recognition software and may include unintentional dictation errors.   Levander Slate, MD 03/21/24 [phone]

## 2024-03-21 NOTE — ED Triage Notes (Signed)
 Mechanical fall yesterday. Fell and hit head, and left side of body yesterday. Fell onto concrete after tripping.No LOC.  Bruising to right eye, forehead noted.  AAOx3. Skin warm and dry. NAD
